# Patient Record
Sex: Female | Born: 1955 | Race: White | Hispanic: No | Marital: Married | Smoking: Former smoker
Health system: Southern US, Community
[De-identification: ages and names within clinical notes are randomized; demographics above are authoritative.]

## PROBLEM LIST (undated history)

## (undated) DIAGNOSIS — C539 Malignant neoplasm of cervix uteri, unspecified: Secondary | ICD-10-CM

## (undated) DIAGNOSIS — D869 Sarcoidosis, unspecified: Secondary | ICD-10-CM

## (undated) HISTORY — DX: Malignant neoplasm of cervix uteri, unspecified: C53.9

---

## 1987-01-31 HISTORY — PX: ABDOMINAL HYSTERECTOMY: SHX81

## 1992-01-31 HISTORY — PX: GALLBLADDER SURGERY: SHX652

## 1997-09-02 ENCOUNTER — Other Ambulatory Visit: Admission: RE | Admit: 1997-09-02 | Discharge: 1997-09-02 | Payer: Self-pay | Admitting: Obstetrics & Gynecology

## 2003-04-06 ENCOUNTER — Other Ambulatory Visit: Admission: RE | Admit: 2003-04-06 | Discharge: 2003-04-06 | Payer: Self-pay | Admitting: Obstetrics and Gynecology

## 2003-10-23 ENCOUNTER — Encounter: Admission: RE | Admit: 2003-10-23 | Discharge: 2003-10-23 | Payer: Self-pay | Admitting: Internal Medicine

## 2003-11-09 ENCOUNTER — Encounter: Payer: Self-pay | Admitting: Internal Medicine

## 2003-11-09 ENCOUNTER — Ambulatory Visit (HOSPITAL_COMMUNITY): Admission: RE | Admit: 2003-11-09 | Discharge: 2003-11-09 | Payer: Self-pay | Admitting: Internal Medicine

## 2004-04-18 ENCOUNTER — Other Ambulatory Visit: Admission: RE | Admit: 2004-04-18 | Discharge: 2004-04-18 | Payer: Self-pay | Admitting: Obstetrics and Gynecology

## 2006-08-15 ENCOUNTER — Ambulatory Visit: Payer: Self-pay | Admitting: Internal Medicine

## 2007-05-17 DIAGNOSIS — F410 Panic disorder [episodic paroxysmal anxiety] without agoraphobia: Secondary | ICD-10-CM | POA: Insufficient documentation

## 2007-05-17 DIAGNOSIS — K219 Gastro-esophageal reflux disease without esophagitis: Secondary | ICD-10-CM

## 2007-05-17 DIAGNOSIS — Z860100 Personal history of colon polyps, unspecified: Secondary | ICD-10-CM | POA: Insufficient documentation

## 2007-05-17 DIAGNOSIS — Z8541 Personal history of malignant neoplasm of cervix uteri: Secondary | ICD-10-CM | POA: Insufficient documentation

## 2007-05-17 DIAGNOSIS — K449 Diaphragmatic hernia without obstruction or gangrene: Secondary | ICD-10-CM | POA: Insufficient documentation

## 2007-05-17 DIAGNOSIS — K589 Irritable bowel syndrome without diarrhea: Secondary | ICD-10-CM | POA: Insufficient documentation

## 2007-11-25 ENCOUNTER — Telehealth: Payer: Self-pay | Admitting: Internal Medicine

## 2007-11-25 ENCOUNTER — Ambulatory Visit: Payer: Self-pay | Admitting: Gastroenterology

## 2007-11-25 DIAGNOSIS — R197 Diarrhea, unspecified: Secondary | ICD-10-CM

## 2007-11-25 DIAGNOSIS — R1013 Epigastric pain: Secondary | ICD-10-CM | POA: Insufficient documentation

## 2007-11-26 LAB — CONVERTED CEMR LAB
ALT: 22 units/L (ref 0–35)
AST: 28 units/L (ref 0–37)
Albumin: 3.6 g/dL (ref 3.5–5.2)
Alkaline Phosphatase: 60 units/L (ref 39–117)
BUN: 11 mg/dL (ref 6–23)
Basophils Absolute: 0.1 10*3/uL (ref 0.0–0.1)
Basophils Relative: 1 % (ref 0.0–3.0)
CO2: 31 meq/L (ref 19–32)
Calcium: 9.1 mg/dL (ref 8.4–10.5)
Chloride: 103 meq/L (ref 96–112)
Creatinine, Ser: 0.9 mg/dL (ref 0.4–1.2)
Eosinophils Absolute: 0.2 10*3/uL (ref 0.0–0.7)
Eosinophils Relative: 3.3 % (ref 0.0–5.0)
GFR calc Af Amer: 84 mL/min
GFR calc non Af Amer: 70 mL/min
Glucose, Bld: 84 mg/dL (ref 70–99)
HCT: 40.7 % (ref 36.0–46.0)
Hemoglobin: 14.2 g/dL (ref 12.0–15.0)
Lymphocytes Relative: 22.5 % (ref 12.0–46.0)
MCHC: 34.9 g/dL (ref 30.0–36.0)
MCV: 89.9 fL (ref 78.0–100.0)
Monocytes Absolute: 0.7 10*3/uL (ref 0.1–1.0)
Monocytes Relative: 9.2 % (ref 3.0–12.0)
Neutro Abs: 4.7 10*3/uL (ref 1.4–7.7)
Neutrophils Relative %: 64 % (ref 43.0–77.0)
Platelets: 242 10*3/uL (ref 150–400)
Potassium: 4.3 meq/L (ref 3.5–5.1)
RBC: 4.53 M/uL (ref 3.87–5.11)
RDW: 13.1 % (ref 11.5–14.6)
Sed Rate: 26 mm/hr — ABNORMAL HIGH (ref 0–22)
Sodium: 140 meq/L (ref 135–145)
Total Bilirubin: 0.7 mg/dL (ref 0.3–1.2)
Total Protein: 6.8 g/dL (ref 6.0–8.3)
WBC: 7.4 10*3/uL (ref 4.5–10.5)

## 2007-11-27 ENCOUNTER — Encounter: Payer: Self-pay | Admitting: Physician Assistant

## 2007-12-18 ENCOUNTER — Ambulatory Visit: Payer: Self-pay | Admitting: Internal Medicine

## 2008-08-06 ENCOUNTER — Encounter: Admission: RE | Admit: 2008-08-06 | Discharge: 2008-08-06 | Payer: Self-pay | Admitting: Obstetrics and Gynecology

## 2009-04-07 ENCOUNTER — Encounter: Admission: RE | Admit: 2009-04-07 | Discharge: 2009-04-07 | Payer: Self-pay | Admitting: Obstetrics and Gynecology

## 2010-02-19 ENCOUNTER — Encounter: Payer: Self-pay | Admitting: Internal Medicine

## 2010-06-14 NOTE — Assessment & Plan Note (Signed)
[facility]                         GASTROENTEROLOGY OFFICE NOTE   Kim Harrison, Kim Harrison                  MRN:          [id number]  DATE:08/15/2006                            DOB:          16-Aug-1955    Kim Harrison is a 55 year old white female with history of irritable  bowel syndrome, status post remote cholecystectomy.  We saw her for ERCP  in 2005 to r/o CBD stricture , exam was unsuccessful.  She had at least  2 episodes of severe mid-umbilical abdominal pain, in the last several  months.  The last one was evaluated with a CT scan of the abdomen.  This  was on April 30, 2006, showing right ovarian cyst, no other abnormality  other than status post the hysterectomy and cholecystectomy state.  She  has a history of adenomatous polyps of the colon.  She had multiple  colonoscopies in the past.  None of them showed presence of diverticula.  The last colonoscopy  in October 2005 did not show any polyps.  She is  due for her next colonoscopy in 2012.   MEDICATIONS:  1. Nexium 40 mg p.o. q.d.  2. Synthroid 1 p.o. q.d.   PHYSICAL EXAMINATION:  VITAL SIGNS:  Blood pressure 108/60, pulse 64 and  weighed 161 pounds.  She was alert, in no distress.  LUNGS:  Clear to auscultation.  COR:  Normal S1, normal S2.  ABDOMEN:  Soft, minimally tender around the peri-umbilical area.  No  distention, normoactive bowel sounds.  The left lower quadrant was  unremarkable.  Epigastrium was normal.  RECTAL:  Exam not done.   IMPRESSION:  A 55 year old white female with episodes of peri-umbilical  abdominal pain.  She was recently treated for diverticulitis by 2  courses of antibiotics with relief of the pain.  It is not really clear  whether she has irritable bowel syndrome with spasm, or whether she  actually had a non-specific colitis or diverticulitis, but judging from  the absence of diverticulitis on the colonoscopy, it would be unlikely  for her to have  actual diverticulitis.  The last episode occurred within  the same week of her mother passing away.   PLAN:  1. Bentyl 10 mg, to take on p.r.n. basis for abdominal  pain.  2. Call our office.  We will to work her in, if she has abdominal      pain, so we can see her while the pain is going on.  3. Next colonoscopy October 2012.    Hedwig Morton. Juanda Chance, MD  Electronically Signed   DMB/MedQ  DD: 08/15/2006  DT: 08/16/2006  Job #: [id number]   cc:   Fredia Beets, M.D.

## 2010-06-17 NOTE — Op Note (Signed)
NAME:  Kim Harrison, Kim Harrison         ACCOUNT NO.:  [id number]   MEDICAL RECORD NO.:  [id number]          PATIENT TYPE:  AMB   LOCATION:  ENDO                         FACILITY:  [facility]   PHYSICIAN:  Lina Sar, M.D. [facility]  DATE OF BIRTH:  Dec 04, 1955   DATE OF PROCEDURE:  11/09/2003  DATE OF DISCHARGE:                                 OPERATIVE REPORT   NAME OF PROCEDURE:  ERCP/upper endoscopy.   INDICATIONS:  This 55 year old white female has history of epigastric pain  with a history of irritable bowel syndrome and intensive pain.  Last attack  occurred about 2 months ago.  She had laparoscopic cholecystectomy in 1994.  Her CT scan of the abdomen was normal with normal anatomy in the right upper  quadrant and normal common bile duct.  Her liver function tests showed mild  elevation of ALT.  Her amylase and lipase were normal.  She has responded  symptomatically to antispasmodics, NuLev 0.125 mg.  She has a history of  adenomatous polyp of the colon and just underwent colonoscopy with findings  of normal colon.  She is also undergoing upper endoscopy/ERCP in order to  assess her for possibility of retained common bile duct stone or gastric  abnormality.   ENDOSCOPE:  Olympus single-channel video endoscope.   SEDATION:  Versed 15 mg IV, fentanyl 125 mcg IV.   FINDINGS:  Olympus single-channel side-viewing duodenoscope passed blindly  through the posterior pharynx and into the esophagus.  The gastric mucosa  was examined thoroughly and showed normal rugae pattern, normal gastric  antrum and lesser curvature of the stomach.  The pyloric outlet was normal.  Retroflexion of the endoscope revealed normal fundus and cardia.  Duodenal  bulb and descending duodenum was unremarkable.  The papilla was located  __________ to the antrum of normal size and configuration.  Bile was exuding  from the papilla.  Unfortunately, we were unable to cannulate the papilla  easily and were afraid that too much  air interpolation would prevent  subsequent colonoscopy.  No abnormal was seen other than surgical clips in  right upper quadrant from previous cholecystectomy.   IMPRESSION:  Normal upper endoscopy of stomach and duodenum with attempted  ERCP but no visualization of the duct.   PLAN:  We will follow up the patient's liver function tests and also  continue her on antispasmodics.  I would like to see her if she has another  attack of abdominal pain, and at that time we would proceed with a HIDA scan  to rule out a common bile duct obstruction.  She so far as responded to use  of antispasmodics and these will be used for any attack of abdominal pain.     Dora   DB/MEDQ  D:  11/09/2003  T:  11/09/2003  Job:  [id number]   cc:   [place] Dr. Fredia Beets

## 2011-01-03 ENCOUNTER — Encounter: Payer: Self-pay | Admitting: Internal Medicine

## 2011-10-20 ENCOUNTER — Encounter: Payer: Self-pay | Admitting: Internal Medicine

## 2013-05-15 ENCOUNTER — Encounter: Payer: Self-pay | Admitting: Pulmonary Disease

## 2013-05-15 ENCOUNTER — Encounter (INDEPENDENT_AMBULATORY_CARE_PROVIDER_SITE_OTHER): Payer: Self-pay

## 2013-05-15 ENCOUNTER — Ambulatory Visit (INDEPENDENT_AMBULATORY_CARE_PROVIDER_SITE_OTHER): Payer: BC Managed Care – PPO | Admitting: Pulmonary Disease

## 2013-05-15 VITALS — BP 104/72 | HR 78 | Temp 97.0°F | Ht 65.5 in | Wt 166.4 lb

## 2013-05-15 DIAGNOSIS — R0609 Other forms of dyspnea: Secondary | ICD-10-CM

## 2013-05-15 DIAGNOSIS — R599 Enlarged lymph nodes, unspecified: Secondary | ICD-10-CM

## 2013-05-15 DIAGNOSIS — R0989 Other specified symptoms and signs involving the circulatory and respiratory systems: Secondary | ICD-10-CM

## 2013-05-15 DIAGNOSIS — R590 Localized enlarged lymph nodes: Secondary | ICD-10-CM

## 2013-05-15 DIAGNOSIS — R0600 Dyspnea, unspecified: Secondary | ICD-10-CM

## 2013-05-15 MED ORDER — PREDNISONE 10 MG PO TABS: ORAL_TABLET | ORAL | Status: DC

## 2013-05-15 NOTE — Assessment & Plan Note (Signed)
The patient has minimal lymphadenopathy in her mediastinum on her recent chest CT, and more than likely this is reactive in nature. However, given her extensive smoking history, and this should be repeated in approximately 6 months for completeness.

## 2013-05-15 NOTE — Assessment & Plan Note (Signed)
I suspect the patient's dyspnea on exertion after her recent pulmonary infection is probably related to ongoing airway inflammation. Currently, she does not have airflow obstruction, and therefore does not have COPD. However, she may have some underlying airways disease from her prior smoking which puts her at risk for her airway inflammation with infection. I would like to treat her with a course of prednisone over the next 8 days and see if her breathing normalizes. If this does not help at all, then I would consider whether her thyroid disease or occult cardiac disease could be contributing to her symptoms.

## 2013-05-15 NOTE — Addendum Note (Signed)
Addended by: Mathis Bud on: 05/15/2013 12:15 PM   Modules accepted: Orders

## 2013-05-15 NOTE — Progress Notes (Signed)
   Subjective:    Patient ID: Kim Harrison, female    DOB: 19-Nov-1955, 58 y.o.   MRN: [id number]  HPI The patient is a 58 year old female who I've been asked to see for persistent dyspnea on exertion. She has a long history of tobacco use, but has not smoked since 1994. She was diagnosed with some type of pulmonary infection in December that may have been pneumonia, and was treated with Levaquin for about 10 days. Her cough and congestion greatly improved, but her dyspnea on exertion continued. Although it has very slowly improved, she has never gotten back to baseline. She is able to walk on flat ground fairly well, but will get winded walking up stairs and mildly bringing groceries in from the car. She was tried on dulera, and thinks this may have helped in total it ran out about 2 weeks ago. She does not see as much improvement with albuterol. She has no history of asthma as a child. She has had a CT of her chest last month that shows mild emphysematous changes, very mild dependent atelectasis in the basis, and minimal lymphadenopathy of unknown origin.   Review of Systems  Constitutional: Negative for fever and unexpected weight change.  HENT: Negative for congestion, dental problem, ear pain, nosebleeds, postnasal drip, rhinorrhea, sinus pressure, sneezing, sore throat and trouble swallowing.   Eyes: Negative for redness and itching.  Respiratory: Positive for cough and shortness of breath. Negative for chest tightness and wheezing.   Cardiovascular: Negative for palpitations and leg swelling.  Gastrointestinal: Negative for nausea and vomiting.  Genitourinary: Negative for dysuria.  Musculoskeletal: Negative for joint swelling.  Skin: Negative for rash.  Neurological: Negative for headaches.  Hematological: Does not bruise/bleed easily.  Psychiatric/Behavioral: Negative for dysphoric mood. The patient is not nervous/anxious.        Objective:   Physical Exam Constitutional:  Well  developed, no acute distress  HENT:  Nares patent without discharge  Oropharynx without exudate, palate and uvula are normal  Eyes:  Perrla, eomi, no scleral icterus  Neck:  No JVD, no TMG  Cardiovascular:  Normal rate, regular rhythm, no rubs or gallops.  No murmurs        Intact distal pulses  Pulmonary :  Normal breath sounds, no stridor or respiratory distress   No rales, rhonchi, or wheezing  Abdominal:  Soft, nondistended, bowel sounds present.  No tenderness noted.   Musculoskeletal:  No lower extremity edema noted.  Lymph Nodes:  No cervical lymphadenopathy noted  Skin:  No cyanosis noted  Neurologic:  Alert, appropriate, moves all 4 extremities without obvious deficit.         Assessment & Plan:

## 2013-05-15 NOTE — Patient Instructions (Signed)
Your breathing studies do not show COPD.  Will treat with a course of prednisone for 8 days for airway inflammation to see if this will help your breathing.  Please call me in 2 weeks to give feedback with how things are going You should have a followup chest ct in 62mos to recheck your lymph nodes.  Will order this at [facility] ([facility]), and call you with results once available.

## 2013-10-23 ENCOUNTER — Telehealth: Payer: Self-pay | Admitting: Pulmonary Disease

## 2013-10-23 NOTE — Telephone Encounter (Signed)
Pt had CT done at [place]. Reports is in [facility] look at. Called pt and line busy x3 wcb

## 2013-10-24 NOTE — Telephone Encounter (Signed)
Spoke with pt - she is aware of below.   [place], please advise.   Thank you.

## 2013-10-28 NOTE — Telephone Encounter (Signed)
Let pt know that her ct is stable from the one earlier in the year.  I would really like her to come in so we can discuss further, and talk about whether we need to do anything about this.

## 2013-10-28 NOTE — Telephone Encounter (Signed)
[phone] wants her results

## 2013-10-29 NOTE — Telephone Encounter (Signed)
Called pt. appt scheduled to see Rarden.

## 2013-10-30 ENCOUNTER — Encounter (INDEPENDENT_AMBULATORY_CARE_PROVIDER_SITE_OTHER): Payer: Self-pay

## 2013-10-30 ENCOUNTER — Encounter: Payer: Self-pay | Admitting: Pulmonary Disease

## 2013-10-30 ENCOUNTER — Ambulatory Visit (INDEPENDENT_AMBULATORY_CARE_PROVIDER_SITE_OTHER): Payer: BC Managed Care – PPO | Admitting: Pulmonary Disease

## 2013-10-30 VITALS — BP 110/70 | HR 88 | Temp 97.0°F | Ht 65.0 in | Wt 165.5 lb

## 2013-10-30 DIAGNOSIS — R599 Enlarged lymph nodes, unspecified: Secondary | ICD-10-CM

## 2013-10-30 DIAGNOSIS — R590 Localized enlarged lymph nodes: Secondary | ICD-10-CM

## 2013-10-30 NOTE — Progress Notes (Signed)
   Subjective:    Patient ID: Kim Harrison, female    DOB: [birth year], 58 y.o.   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  This is felt to be reactive or possibly inflammatory in nature. She's had a recent CT chest that shows no change from 6 months ago, making cancer much less likely. I have reviewed the CT results in detail with her and a family member, and answered all of their questions.   Review of Systems  Constitutional: Negative for fever and unexpected weight change.  HENT: Negative for congestion, dental problem, ear pain, nosebleeds, postnasal drip, rhinorrhea, sinus pressure, sneezing, sore throat and trouble swallowing.   Eyes: Negative for redness and itching.  Respiratory: Negative for cough, chest tightness, shortness of breath and wheezing.   Cardiovascular: Negative for palpitations and leg swelling.  Gastrointestinal: Negative for nausea and vomiting.  Genitourinary: Negative for dysuria.  Musculoskeletal: Negative for joint swelling.  Skin: Negative for rash.  Neurological: Negative for headaches.  Hematological: Does not bruise/bleed easily.  Psychiatric/Behavioral: Negative for dysphoric mood. The patient is not nervous/anxious.        Objective:   Physical Exam Well-developed female in acute distress Lower extremities without edema, no cyanosis Alert and oriented, moves all 4 extremities.       Assessment & Plan:

## 2013-10-30 NOTE — Patient Instructions (Signed)
Will schedule for a followup scan in 40mos, and will call you with results.

## 2013-10-30 NOTE — Assessment & Plan Note (Addendum)
The patient has had a followup CT chest that shows persistent moderate mediastinal lymphadenopathy. This is very unlikely to be small cell carcinoma with no change over 6 months, and is also unlikely to be lymphoma. I suspect this is either an inflammatory process, or possibly reactive in nature. I have had a long discussion with the patient and her family member about continued observation versus biopsy with EBU S. or mediastinoscopy.  I have also discussed with them that she may not even require treatment if this is some kind of inflammatory process. At this point, the patient would like to followup with continued observation, and will consider whether to do a biopsy or not.  Time spent discussing the above was 49min.

## 2013-11-19 ENCOUNTER — Encounter: Payer: Self-pay | Admitting: Pulmonary Disease

## 2014-01-07 ENCOUNTER — Ambulatory Visit (INDEPENDENT_AMBULATORY_CARE_PROVIDER_SITE_OTHER): Payer: BC Managed Care – PPO | Admitting: Pulmonary Disease

## 2014-01-07 ENCOUNTER — Ambulatory Visit (INDEPENDENT_AMBULATORY_CARE_PROVIDER_SITE_OTHER)
Admission: RE | Admit: 2014-01-07 | Discharge: 2014-01-07 | Disposition: A | Discharge status: Home / Self Care | Payer: BC Managed Care – PPO | Source: Ambulatory Visit | Attending: Pulmonary Disease | Admitting: Pulmonary Disease

## 2014-01-07 ENCOUNTER — Encounter: Payer: Self-pay | Admitting: Pulmonary Disease

## 2014-01-07 VITALS — BP 92/68 | HR 86 | Temp 97.1°F | Ht 65.0 in | Wt 169.5 lb

## 2014-01-07 DIAGNOSIS — R05 Cough: Secondary | ICD-10-CM | POA: Insufficient documentation

## 2014-01-07 DIAGNOSIS — R599 Enlarged lymph nodes, unspecified: Secondary | ICD-10-CM

## 2014-01-07 DIAGNOSIS — R590 Localized enlarged lymph nodes: Secondary | ICD-10-CM

## 2014-01-07 DIAGNOSIS — R053 Chronic cough: Secondary | ICD-10-CM | POA: Insufficient documentation

## 2014-01-07 MED ORDER — HYDROCODONE-HOMATROPINE 5-1.5 MG/5ML PO SYRP: 5.0000 mL | ORAL_SOLUTION | ORAL | Status: DC | PRN

## 2014-01-07 NOTE — Progress Notes (Signed)
   Subjective:    Patient ID: Kim Harrison, female    DOB: [birth year], 58 y.o.   MRN: [id number]  HPI The patient comes in today for an acute sick visit. 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 Most recently she has had cold-like symptoms, which has resulted in an escalation of her cough. She develops cough paroxysms to the point of near vomiting. The cough is completely dry, and she does not have chest congestion. She is worried because the last time she had the symptoms she developed pneumonia. She has had no fevers or chills, and no increased shortness of breath. She had discontinued her acid reflux medication, and thought the 2 were related, but the cough has continued despite being back on her proton pump inhibitor. She denies any significant postnasal drip at this time.   Review of Systems  Constitutional: Negative for fever and unexpected weight change.  HENT: Positive for congestion. Negative for dental problem, ear pain, nosebleeds, postnasal drip, rhinorrhea, sinus pressure, sneezing, sore throat and trouble swallowing.   Eyes: Negative for redness and itching.  Respiratory: Positive for cough, choking and chest tightness. Negative for shortness of breath and wheezing.   Cardiovascular: Negative for palpitations and leg swelling.  Gastrointestinal: Negative for nausea and vomiting.  Genitourinary: Negative for dysuria.  Musculoskeletal: Negative for joint swelling.  Skin: Negative for rash.  Neurological: Negative for headaches.  Hematological: Does not bruise/bleed easily.  Psychiatric/Behavioral: Negative for dysphoric mood. The patient is not nervous/anxious.        Objective:   Physical Exam Well-developed female in no acute distress Nose without purulence or discharge noted Lymphadenopathy or thyromegaly Chest is totally clear to auscultation, no wheezing Cardiac exam  with regular rate and rhythm Lower extremities without edema, no cyanosis Alert and oriented, moves all 4 extremities.       Assessment & Plan:

## 2014-01-07 NOTE — Assessment & Plan Note (Signed)
The patient has a history of a chronic cough that sounds more upper airway in origin than lower. She most recently has developed a typical head cold that sounds viral in nature, and is worried about developing pneumonia. We can certainly check a chest x-ray to evaluate for pneumonia, but her cough is totally dry and sounds more upper airway. She has been taking reflux medication without improvement. At this point, I would like to suppress her cough, and have also reviewed behavioral therapies that will help with cough propagation.  Because this has been an ongoing process, and she gets frequent "colds", will schedule for a methacholine challenge test to rule out the possibility of cough variant asthma. If this is unremarkable, we will have to have a discussion whether to proceed with biopsy of her mild to moderate mediastinal lymphadenopathy to evaluate for sarcoidosis, which can also cause a chronic cough.

## 2014-01-07 NOTE — Patient Instructions (Signed)
Will suppress cough with hydromet one teaspoon every 4 hrs if needed for cough Limit voice use as much as possible, NO throat clearing Use hard candy (no menthol or mint) to bathe the back of your throat during the day. Will check chest xray today to make sure no pneumonia Will schedule for methacholine challenge test to evaluate for cough variant asthma.  Will call you once the results are available.

## 2014-01-21 ENCOUNTER — Ambulatory Visit (HOSPITAL_COMMUNITY)
Admission: RE | Admit: 2014-01-21 | Discharge: 2014-01-21 | Disposition: A | Discharge status: Home / Self Care | Payer: BC Managed Care – PPO | Source: Ambulatory Visit | Attending: Pulmonary Disease | Admitting: Pulmonary Disease

## 2014-01-21 DIAGNOSIS — F17210 Nicotine dependence, cigarettes, uncomplicated: Secondary | ICD-10-CM | POA: Diagnosis not present

## 2014-01-21 DIAGNOSIS — R05 Cough: Secondary | ICD-10-CM | POA: Diagnosis present

## 2014-01-21 DIAGNOSIS — R053 Chronic cough: Secondary | ICD-10-CM

## 2014-01-21 LAB — PULMONARY FUNCTION TEST
FEF 25-75 Post: 2.17 L/sec
FEF 25-75 Pre: 1.49 L/sec
FEF2575-%Change-Post: 45 %
FEF2575-%Pred-Post: 89 %
FEF2575-%Pred-Pre: 61 %
FEV1-%Change-Post: 7 %
FEV1-%Pred-Post: 71 %
FEV1-%Pred-Pre: 66 %
FEV1-Post: 1.9 L
FEV1-Pre: 1.77 L
FEV1FVC-%Change-Post: 13 %
FEV1FVC-%Pred-Pre: 97 %
FEV6-%Change-Post: -5 %
FEV6-%Pred-Post: 66 %
FEV6-%Pred-Pre: 70 %
FEV6-Post: 2.2 L
FEV6-Pre: 2.34 L
FEV6FVC-%Pred-Post: 103 %
FEV6FVC-%Pred-Pre: 103 %
FVC-%Change-Post: -5 %
FVC-%Pred-Post: 64 %
FVC-%Pred-Pre: 67 %
FVC-Post: 2.2 L
FVC-Pre: 2.34 L
Post FEV1/FVC ratio: 86 %
Post FEV6/FVC ratio: 100 %
Pre FEV1/FVC ratio: 76 %
Pre FEV6/FVC Ratio: 100 %

## 2014-01-21 MED ORDER — ALBUTEROL SULFATE (2.5 MG/3ML) 0.083% IN NEBU
2.5000 mg | INHALATION_SOLUTION | Freq: Once | RESPIRATORY_TRACT | Status: AC
  Administered 2014-01-21: 2.5 mg via RESPIRATORY_TRACT

## 2014-01-21 MED ORDER — METHACHOLINE 1 MG/ML NEB SOLN
2.0000 mL | Freq: Once | RESPIRATORY_TRACT | Status: AC
  Administered 2014-01-21: 2 mg via RESPIRATORY_TRACT

## 2014-01-21 MED ORDER — METHACHOLINE 0.0625 MG/ML NEB SOLN
2.0000 mL | Freq: Once | RESPIRATORY_TRACT | Status: AC
  Administered 2014-01-21: 0.125 mg via RESPIRATORY_TRACT

## 2014-01-21 MED ORDER — METHACHOLINE 0.25 MG/ML NEB SOLN
2.0000 mL | Freq: Once | RESPIRATORY_TRACT | Status: AC
  Administered 2014-01-21: 0.5 mg via RESPIRATORY_TRACT

## 2014-01-21 MED ORDER — METHACHOLINE 4 MG/ML NEB SOLN
2.0000 mL | Freq: Once | RESPIRATORY_TRACT | Status: AC
  Administered 2014-01-21: 8 mg via RESPIRATORY_TRACT

## 2014-01-21 MED ORDER — ALBUTEROL SULFATE (2.5 MG/3ML) 0.083% IN NEBU: 2.5000 mg | INHALATION_SOLUTION | Freq: Once | RESPIRATORY_TRACT | Status: DC

## 2014-01-21 MED ORDER — SODIUM CHLORIDE 0.9 % IN NEBU
3.0000 mL | INHALATION_SOLUTION | Freq: Once | RESPIRATORY_TRACT | Status: AC
  Administered 2014-01-21: 3 mL via RESPIRATORY_TRACT

## 2014-01-21 MED ORDER — METHACHOLINE 16 MG/ML NEB SOLN
2.0000 mL | Freq: Once | RESPIRATORY_TRACT | Status: AC
  Administered 2014-01-21: 32 mg via RESPIRATORY_TRACT

## 2014-01-22 ENCOUNTER — Telehealth: Payer: Self-pay | Admitting: *Deleted

## 2014-01-22 NOTE — Telephone Encounter (Signed)
Called pt to schedule OV to discuss meth challenge test results.  She was upset and stated she wished I did not call her today on christmas eve and now she will worry all through [place]. She wants to know what the results are instead of having to wait until 02/04/13 to be seen. She did not schedule appt yet. Pt was upset bc I did not give her any results on the phone. Please advise [place] thanks

## 2014-01-22 NOTE — Telephone Encounter (Signed)
Pt is aware and appt scheduled for 02/04/14. Nothing further needed

## 2014-01-22 NOTE — Telephone Encounter (Signed)
There is nothing bad on her meth challenge, and certainly nothing to ruin her christmas.  She really needs to have an appt to discuss her test but also her cough and xrays further.   Please make an ov apptm.

## 2014-02-04 ENCOUNTER — Encounter: Payer: Self-pay | Admitting: Pulmonary Disease

## 2014-02-04 ENCOUNTER — Ambulatory Visit (INDEPENDENT_AMBULATORY_CARE_PROVIDER_SITE_OTHER): Payer: BLUE CROSS/BLUE SHIELD | Admitting: Pulmonary Disease

## 2014-02-04 VITALS — BP 118/70 | HR 80 | Temp 97.4°F | Ht 65.0 in | Wt 167.8 lb

## 2014-02-04 DIAGNOSIS — R599 Enlarged lymph nodes, unspecified: Secondary | ICD-10-CM

## 2014-02-04 DIAGNOSIS — R590 Localized enlarged lymph nodes: Secondary | ICD-10-CM

## 2014-02-04 DIAGNOSIS — R05 Cough: Secondary | ICD-10-CM

## 2014-02-04 DIAGNOSIS — R053 Chronic cough: Secondary | ICD-10-CM

## 2014-02-04 NOTE — Progress Notes (Signed)
   Subjective:    Patient ID: Kim Harrison, female    DOB: Dec 08, 1955, 59 y.o.   MRN: [id number]  HPI  patient comes in today for follow-up of her chronic cough, mediastinal lymphadenopathy, and also a recent methacholine challenge. Her methacholine challenge was negative for hyperreactive airways disease. The patient's cough was treated aggressively with suppression and behavioral therapies, and unfortunately has totally resolved. The patient feels that she is back to her usual baseline.   Review of Systems  Constitutional: Negative for fever and unexpected weight change.  HENT: Negative for congestion, dental problem, ear pain, nosebleeds, postnasal drip, rhinorrhea, sinus pressure, sneezing, sore throat and trouble swallowing.   Eyes: Negative for redness and itching.  Respiratory: Negative for cough, chest tightness, shortness of breath and wheezing.   Cardiovascular: Negative for palpitations and leg swelling.  Gastrointestinal: Negative for nausea and vomiting.  Genitourinary: Negative for dysuria.  Musculoskeletal: Negative for joint swelling.  Skin: Negative for rash.  Neurological: Negative for headaches.  Hematological: Does not bruise/bleed easily.  Psychiatric/Behavioral: Negative for dysphoric mood. The patient is not nervous/anxious.        Objective:   Physical Exam  well-developed female in no acute distress  Nose without purulence or discharge noted  Neck without lymphadenopathy or thyromegaly  Lower extremities without edema, no cyanosis  Alert and oriented, moves all 4 extremities.       Assessment & Plan:

## 2014-02-04 NOTE — Assessment & Plan Note (Signed)
The patient's cough has now result with cough suppression and behavioral therapies for upper airway irritation. Her methacholine challenge test was negative for hyperreactive airways. I really believe this was cyclical coughing after her episode of pneumonia.

## 2014-02-04 NOTE — Assessment & Plan Note (Signed)
The patient is scheduled for a follow-up scan in April of this year.

## 2014-02-04 NOTE — Patient Instructions (Signed)
Keep your followup for a repeat ct chest in April If you start feeling your cough is coming back, work on limiting your voice/hard candy during day to limit tickle/no throat clearing. Will call after your scan in April

## 2014-05-11 ENCOUNTER — Other Ambulatory Visit: Payer: BC Managed Care – PPO

## 2015-07-22 DIAGNOSIS — Y999 Unspecified external cause status: Secondary | ICD-10-CM | POA: Diagnosis not present

## 2015-07-22 DIAGNOSIS — Z87891 Personal history of nicotine dependence: Secondary | ICD-10-CM | POA: Diagnosis not present

## 2015-07-22 DIAGNOSIS — Y9389 Activity, other specified: Secondary | ICD-10-CM | POA: Insufficient documentation

## 2015-07-22 DIAGNOSIS — T189XXA Foreign body of alimentary tract, part unspecified, initial encounter: Secondary | ICD-10-CM | POA: Diagnosis present

## 2015-07-22 DIAGNOSIS — Z8541 Personal history of malignant neoplasm of cervix uteri: Secondary | ICD-10-CM | POA: Insufficient documentation

## 2015-07-22 DIAGNOSIS — S27818A Other injury of esophagus (thoracic part), initial encounter: Secondary | ICD-10-CM | POA: Diagnosis not present

## 2015-07-22 DIAGNOSIS — X58XXXA Exposure to other specified factors, initial encounter: Secondary | ICD-10-CM | POA: Insufficient documentation

## 2015-07-22 DIAGNOSIS — Z79899 Other long term (current) drug therapy: Secondary | ICD-10-CM | POA: Diagnosis not present

## 2015-07-22 DIAGNOSIS — Y929 Unspecified place or not applicable: Secondary | ICD-10-CM | POA: Insufficient documentation

## 2015-07-23 ENCOUNTER — Encounter (HOSPITAL_COMMUNITY): Payer: Self-pay | Admitting: Emergency Medicine

## 2015-07-23 ENCOUNTER — Emergency Department (HOSPITAL_COMMUNITY): Payer: BLUE CROSS/BLUE SHIELD

## 2015-07-23 ENCOUNTER — Emergency Department (HOSPITAL_COMMUNITY)
Admission: EM | Admit: 2015-07-23 | Discharge: 2015-07-23 | Disposition: A | Discharge status: Home / Self Care | Payer: BLUE CROSS/BLUE SHIELD | Attending: Emergency Medicine | Admitting: Emergency Medicine

## 2015-07-23 DIAGNOSIS — S27818A Other injury of esophagus (thoracic part), initial encounter: Secondary | ICD-10-CM

## 2015-07-23 HISTORY — DX: Sarcoidosis, unspecified: D86.9

## 2015-07-23 MED ORDER — GI COCKTAIL ~~LOC~~
30.0000 mL | Freq: Once | ORAL | Status: AC
  Administered 2015-07-23: 30 mL via ORAL
  Filled 2015-07-23: qty 30

## 2015-07-23 MED ORDER — MAGIC MOUTHWASH: 5.0000 mL | Freq: Three times a day (TID) | ORAL | Status: DC | PRN

## 2015-07-23 NOTE — ED Notes (Signed)
Pt. reports foreign object stuck in her throat while eating dinner this evening , airway intact / respirations unlabored , no obstruction during visual inspection .

## 2015-07-23 NOTE — ED Provider Notes (Signed)
CSN: [id number]     Arrival date & time 07/22/15  2359 History  By signing my name below, I, Kim Harrison, attest that this documentation has been prepared under the direction and in the presence of physician practitioner, Merryl Hacker, MD. Electronically Signed: Dora Harrison, Scribe. 07/23/2015. 3:34 AM.   Chief Complaint  Patient presents with  . Swallowed Foreign Body    The history is provided by the patient. No language interpreter was used.     HPI Comments: Kim Harrison is a 60 y.o. female who presents to the Emergency Department complaining of swallowing a foreign body while eating boneless flounder for dinner yesterday evening. She also notes that she ate a salad which contained peas. Pt reports that it feels like there is something stuck in her throat and endorses moderate, stabbing pain in her throat with swallowing since onset. She has not experienced similar episodes in the past. She denies difficulty swallowing, vomiting, SOB, or any other associated symptoms.  Past Medical History  Diagnosis Date  . Cervical cancer ([place])   . Sarcoidosis [facility])    Past Surgical History  Procedure Laterality Date  . Gallbladder surgery  1994  . Abdominal hysterectomy  1989   Family History  Problem Relation Age of Onset  . Rheum arthritis Mother   . Allergies Mother   . Multiple sclerosis Sister   . Breast cancer Mother   . Lung cancer Maternal Grandfather   . Lung cancer Cousin   . Lung cancer Maternal Uncle   . Lung cancer Maternal Uncle   . Lung cancer Maternal Uncle   . Pulmonary fibrosis Mother     deceased  . Autoimmune disease Cousin    Social History  Substance Use Topics  . Smoking status: Former Smoker -- 3.00 packs/day for 0 years    Types: Cigarettes    Quit date: 01/31/1992  . Smokeless tobacco: None     Comment: pt reports smoking 2-3 PPD-- 3 PPD at heaviest  . Alcohol Use: Yes   OB History    No data available     Review of Systems  HENT:  Positive for sore throat. Negative for trouble swallowing.        Positive for throat pain (with swallowing). Positive for foreign body in throat.  Respiratory: Negative for shortness of breath.   Gastrointestinal: Negative for vomiting.  All other systems reviewed and are negative.  Allergies  Cephalexin; Penicillins; and Sulfonamide derivatives  Home Medications   Prior to Admission medications   Medication Sig Start Date End Date Taking? Authorizing Provider  dexlansoprazole (DEXILANT) 60 MG capsule Take 60 mg by mouth daily.    Historical Provider, MD  EPINEPHrine (EPIPEN 2-PAK) 0.3 mg/0.3 mL IJ SOAJ injection Inject 0.3 mg into the muscle once.    Historical Provider, MD  magic mouthwash SOLN Take 5 mLs by mouth 3 (three) times daily as needed (throat pain). 07/23/15   Merryl Hacker, MD  mometasone (ASMANEX 120 METERED DOSES) 220 MCG/INH inhaler Inhale 1 puff into the lungs daily.    Historical Provider, MD  NEXIUM 40 MG capsule Take 1 capsule by mouth daily. 05/13/13   Historical Provider, MD  pravastatin (PRAVACHOL) 40 MG tablet Take 1 tablet by mouth daily. 05/13/13   Historical Provider, MD  SYNTHROID 75 MCG tablet Take 1 tablet by mouth daily. 05/13/13   Historical Provider, MD   BP 113/88 mmHg  Pulse 93  Temp(Src) 98.1 F (36.7 C) (Oral)  Resp 20  Ht 5\' 5"  (1.651 m)  Wt 155 lb (70.308 kg)  BMI 25.79 kg/m2  SpO2 99% Physical Exam  Constitutional: She is oriented to person, place, and time. She appears well-developed and well-nourished. No distress.  HENT:  Head: Normocephalic and atraumatic.  Mouth/Throat: Oropharynx is clear and moist.  Eyes: Pupils are equal, round, and reactive to light.  Neck: Normal range of motion. Neck supple.  Cardiovascular: Normal rate, regular rhythm and normal heart sounds.   No murmur heard. Pulmonary/Chest: Effort normal and breath sounds normal. No stridor. No respiratory distress. She has no wheezes.  Abdominal: Soft. There is no  tenderness.  Neurological: She is alert and oriented to person, place, and time.  Skin: Skin is warm and dry.  Psychiatric: She has a normal mood and affect.  Nursing note and vitals reviewed.   ED Course  Procedures (including critical care time)  DIAGNOSTIC STUDIES: Oxygen Saturation is 99% on RA, normal by my interpretation.    COORDINATION OF CARE: 3:34 AM Discussed treatment plan with pt at bedside and pt agreed to plan.  Labs Review Labs Reviewed - No data to display  Imaging Review Dg Neck Soft Tissue  07/23/2015  CLINICAL DATA:  60 year old female with foreign body sensation in the throat EXAM: NECK SOFT TISSUES - 1+ VIEW COMPARISON:  None. FINDINGS: There is no evidence of retropharyngeal soft tissue swelling or epiglottic enlargement. The cervical airway is unremarkable and no radio-opaque foreign body identified. IMPRESSION: Negative. Electronically Signed   By: Anner Crete M.D.   On: 07/23/2015 00:58   I have personally reviewed and evaluated these images and lab results as part of my medical decision-making.   EKG Interpretation None      MDM   Final diagnoses:  Esophageal abrasion, initial encounter    Patient presents with foreign body sensation in her throat. She is nontoxic on exam. She is tolerating her secretions. She is not vomiting and has been able to swallow and eat. She reports worsening of pain with eating. Soft tissue neck negative for foreign body. Suspect abrasion of the esophagus. She was given a GI cocktail and did have improvement of the sensation. She is also able to tolerate fluids. Will discharge home with Magic mouthwash as needed. She was given gastroenterology follow-up if she has any worsening of her symptoms.  After history, exam, and medical workup I feel the patient has been appropriately medically screened and is safe for discharge home. Pertinent diagnoses were discussed with the patient. Patient was given return precautions.  I  personally performed the services described in this documentation, which was scribed in my presence. The recorded information has been reviewed and is accurate.   Merryl Hacker, MD 07/23/15 [phone]

## 2015-07-23 NOTE — Discharge Instructions (Signed)
Esophageal abrasion  You were seen today and likely have a minor abrasion of your esophagus secondary to food. There is no evidence of esophageal obstruction. You may eat as tolerated. Magic mouth wash 3 times a day as needed for pain.  SEEK MEDICAL CARE IF:  You continue to have symptoms of a swallowed foreign body.  The object has not passed out of your body after 3 days. SEEK IMMEDIATE MEDICAL CARE IF:  You have a fever.  You have pain in your chest or your abdomen.  You cough up blood.  You have blood in your stool (feces) or your vomit.   This information is not intended to replace advice given to you by your health care provider. Make sure you discuss any questions you have with your health care provider.   Document Released: 07/06/2009 Document Revised: 10/07/2014 Document Reviewed: 04/15/2014 Elsevier Interactive Patient Education [employer].

## 2017-04-30 ENCOUNTER — Encounter: Payer: Self-pay | Admitting: Gastroenterology

## 2017-09-17 ENCOUNTER — Telehealth: Payer: Self-pay

## 2017-09-17 NOTE — Telephone Encounter (Signed)
Error

## 2018-12-09 ENCOUNTER — Emergency Department (HOSPITAL_COMMUNITY): Payer: BC Managed Care – PPO

## 2018-12-09 ENCOUNTER — Encounter (HOSPITAL_COMMUNITY): Payer: Self-pay

## 2018-12-09 ENCOUNTER — Inpatient Hospital Stay (HOSPITAL_COMMUNITY)
Admission: EM | Admit: 2018-12-09 | Discharge: 2019-01-31 | DRG: 207 | Disposition: E | Payer: BC Managed Care – PPO | Attending: Pulmonary Disease | Admitting: Pulmonary Disease

## 2018-12-09 ENCOUNTER — Other Ambulatory Visit: Payer: Self-pay

## 2018-12-09 DIAGNOSIS — E870 Hyperosmolality and hypernatremia: Secondary | ICD-10-CM | POA: Diagnosis not present

## 2018-12-09 DIAGNOSIS — R6521 Severe sepsis with septic shock: Secondary | ICD-10-CM | POA: Diagnosis not present

## 2018-12-09 DIAGNOSIS — R1110 Vomiting, unspecified: Secondary | ICD-10-CM

## 2018-12-09 DIAGNOSIS — E873 Alkalosis: Secondary | ICD-10-CM | POA: Diagnosis present

## 2018-12-09 DIAGNOSIS — Z801 Family history of malignant neoplasm of trachea, bronchus and lung: Secondary | ICD-10-CM

## 2018-12-09 DIAGNOSIS — J1289 Other viral pneumonia: Secondary | ICD-10-CM | POA: Diagnosis present

## 2018-12-09 DIAGNOSIS — J1282 Pneumonia due to coronavirus disease 2019: Secondary | ICD-10-CM | POA: Diagnosis present

## 2018-12-09 DIAGNOSIS — R739 Hyperglycemia, unspecified: Secondary | ICD-10-CM | POA: Diagnosis not present

## 2018-12-09 DIAGNOSIS — E874 Mixed disorder of acid-base balance: Secondary | ICD-10-CM | POA: Diagnosis not present

## 2018-12-09 DIAGNOSIS — Z888 Allergy status to other drugs, medicaments and biological substances status: Secondary | ICD-10-CM

## 2018-12-09 DIAGNOSIS — H1132 Conjunctival hemorrhage, left eye: Secondary | ICD-10-CM | POA: Diagnosis not present

## 2018-12-09 DIAGNOSIS — R7303 Prediabetes: Secondary | ICD-10-CM | POA: Diagnosis present

## 2018-12-09 DIAGNOSIS — I959 Hypotension, unspecified: Secondary | ICD-10-CM | POA: Diagnosis present

## 2018-12-09 DIAGNOSIS — Z79899 Other long term (current) drug therapy: Secondary | ICD-10-CM

## 2018-12-09 DIAGNOSIS — E039 Hypothyroidism, unspecified: Secondary | ICD-10-CM | POA: Diagnosis present

## 2018-12-09 DIAGNOSIS — Z87891 Personal history of nicotine dependence: Secondary | ICD-10-CM

## 2018-12-09 DIAGNOSIS — Z90710 Acquired absence of both cervix and uterus: Secondary | ICD-10-CM

## 2018-12-09 DIAGNOSIS — J80 Acute respiratory distress syndrome: Secondary | ICD-10-CM | POA: Diagnosis not present

## 2018-12-09 DIAGNOSIS — Z452 Encounter for adjustment and management of vascular access device: Secondary | ICD-10-CM

## 2018-12-09 DIAGNOSIS — G92 Toxic encephalopathy: Secondary | ICD-10-CM | POA: Diagnosis not present

## 2018-12-09 DIAGNOSIS — K449 Diaphragmatic hernia without obstruction or gangrene: Secondary | ICD-10-CM | POA: Diagnosis present

## 2018-12-09 DIAGNOSIS — R748 Abnormal levels of other serum enzymes: Secondary | ICD-10-CM | POA: Diagnosis not present

## 2018-12-09 DIAGNOSIS — Z79890 Hormone replacement therapy (postmenopausal): Secondary | ICD-10-CM

## 2018-12-09 DIAGNOSIS — E785 Hyperlipidemia, unspecified: Secondary | ICD-10-CM | POA: Diagnosis present

## 2018-12-09 DIAGNOSIS — Z515 Encounter for palliative care: Secondary | ICD-10-CM | POA: Diagnosis not present

## 2018-12-09 DIAGNOSIS — T380X5A Adverse effect of glucocorticoids and synthetic analogues, initial encounter: Secondary | ICD-10-CM | POA: Diagnosis not present

## 2018-12-09 DIAGNOSIS — D539 Nutritional anemia, unspecified: Secondary | ICD-10-CM | POA: Diagnosis present

## 2018-12-09 DIAGNOSIS — Z09 Encounter for follow-up examination after completed treatment for conditions other than malignant neoplasm: Secondary | ICD-10-CM

## 2018-12-09 DIAGNOSIS — Z8541 Personal history of malignant neoplasm of cervix uteri: Secondary | ICD-10-CM

## 2018-12-09 DIAGNOSIS — I248 Other forms of acute ischemic heart disease: Secondary | ICD-10-CM | POA: Diagnosis present

## 2018-12-09 DIAGNOSIS — S27399A Other injuries of lung, unspecified, initial encounter: Secondary | ICD-10-CM | POA: Diagnosis not present

## 2018-12-09 DIAGNOSIS — F410 Panic disorder [episodic paroxysmal anxiety] without agoraphobia: Secondary | ICD-10-CM | POA: Diagnosis not present

## 2018-12-09 DIAGNOSIS — I452 Bifascicular block: Secondary | ICD-10-CM | POA: Diagnosis not present

## 2018-12-09 DIAGNOSIS — E162 Hypoglycemia, unspecified: Secondary | ICD-10-CM | POA: Diagnosis not present

## 2018-12-09 DIAGNOSIS — J15212 Pneumonia due to Methicillin resistant Staphylococcus aureus: Secondary | ICD-10-CM | POA: Diagnosis not present

## 2018-12-09 DIAGNOSIS — R601 Generalized edema: Secondary | ICD-10-CM | POA: Diagnosis not present

## 2018-12-09 DIAGNOSIS — Z8261 Family history of arthritis: Secondary | ICD-10-CM

## 2018-12-09 DIAGNOSIS — E876 Hypokalemia: Secondary | ICD-10-CM | POA: Diagnosis present

## 2018-12-09 DIAGNOSIS — F419 Anxiety disorder, unspecified: Secondary | ICD-10-CM | POA: Diagnosis present

## 2018-12-09 DIAGNOSIS — T4275XA Adverse effect of unspecified antiepileptic and sedative-hypnotic drugs, initial encounter: Secondary | ICD-10-CM | POA: Diagnosis not present

## 2018-12-09 DIAGNOSIS — J189 Pneumonia, unspecified organism: Secondary | ICD-10-CM

## 2018-12-09 DIAGNOSIS — I480 Paroxysmal atrial fibrillation: Secondary | ICD-10-CM | POA: Diagnosis not present

## 2018-12-09 DIAGNOSIS — Z66 Do not resuscitate: Secondary | ICD-10-CM | POA: Diagnosis not present

## 2018-12-09 DIAGNOSIS — A4189 Other specified sepsis: Secondary | ICD-10-CM | POA: Diagnosis not present

## 2018-12-09 DIAGNOSIS — E875 Hyperkalemia: Secondary | ICD-10-CM | POA: Diagnosis not present

## 2018-12-09 DIAGNOSIS — Y95 Nosocomial condition: Secondary | ICD-10-CM | POA: Diagnosis not present

## 2018-12-09 DIAGNOSIS — D6489 Other specified anemias: Secondary | ICD-10-CM | POA: Diagnosis present

## 2018-12-09 DIAGNOSIS — Z820 Family history of epilepsy and other diseases of the nervous system: Secondary | ICD-10-CM

## 2018-12-09 DIAGNOSIS — K589 Irritable bowel syndrome without diarrhea: Secondary | ICD-10-CM | POA: Diagnosis present

## 2018-12-09 DIAGNOSIS — K219 Gastro-esophageal reflux disease without esophagitis: Secondary | ICD-10-CM | POA: Diagnosis present

## 2018-12-09 DIAGNOSIS — G9341 Metabolic encephalopathy: Secondary | ICD-10-CM | POA: Diagnosis not present

## 2018-12-09 DIAGNOSIS — E878 Other disorders of electrolyte and fluid balance, not elsewhere classified: Secondary | ICD-10-CM | POA: Diagnosis present

## 2018-12-09 DIAGNOSIS — D696 Thrombocytopenia, unspecified: Secondary | ICD-10-CM | POA: Diagnosis not present

## 2018-12-09 DIAGNOSIS — U071 COVID-19: Principal | ICD-10-CM | POA: Diagnosis present

## 2018-12-09 DIAGNOSIS — Z22322 Carrier or suspected carrier of Methicillin resistant Staphylococcus aureus: Secondary | ICD-10-CM

## 2018-12-09 DIAGNOSIS — Z880 Allergy status to penicillin: Secondary | ICD-10-CM

## 2018-12-09 DIAGNOSIS — Z7951 Long term (current) use of inhaled steroids: Secondary | ICD-10-CM

## 2018-12-09 DIAGNOSIS — Z881 Allergy status to other antibiotic agents status: Secondary | ICD-10-CM

## 2018-12-09 DIAGNOSIS — K5900 Constipation, unspecified: Secondary | ICD-10-CM | POA: Diagnosis not present

## 2018-12-09 DIAGNOSIS — R0902 Hypoxemia: Secondary | ICD-10-CM

## 2018-12-09 DIAGNOSIS — Z91018 Allergy to other foods: Secondary | ICD-10-CM

## 2018-12-09 DIAGNOSIS — Z86010 Personal history of colonic polyps: Secondary | ICD-10-CM

## 2018-12-09 DIAGNOSIS — R0600 Dyspnea, unspecified: Secondary | ICD-10-CM | POA: Diagnosis not present

## 2018-12-09 DIAGNOSIS — J9690 Respiratory failure, unspecified, unspecified whether with hypoxia or hypercapnia: Secondary | ICD-10-CM

## 2018-12-09 DIAGNOSIS — J9601 Acute respiratory failure with hypoxia: Secondary | ICD-10-CM

## 2018-12-09 DIAGNOSIS — L89816 Pressure-induced deep tissue damage of head: Secondary | ICD-10-CM | POA: Diagnosis not present

## 2018-12-09 DIAGNOSIS — J069 Acute upper respiratory infection, unspecified: Secondary | ICD-10-CM

## 2018-12-09 DIAGNOSIS — Z882 Allergy status to sulfonamides status: Secondary | ICD-10-CM

## 2018-12-09 DIAGNOSIS — D869 Sarcoidosis, unspecified: Secondary | ICD-10-CM | POA: Diagnosis present

## 2018-12-09 DIAGNOSIS — I952 Hypotension due to drugs: Secondary | ICD-10-CM | POA: Diagnosis not present

## 2018-12-09 LAB — BASIC METABOLIC PANEL
Anion gap: 10 (ref 5–15)
BUN: 20 mg/dL (ref 8–23)
CO2: 22 mmol/L (ref 22–32)
Calcium: 8.9 mg/dL (ref 8.9–10.3)
Chloride: 105 mmol/L (ref 98–111)
Creatinine, Ser: 1.16 mg/dL — ABNORMAL HIGH (ref 0.44–1.00)
GFR calc Af Amer: 58 mL/min — ABNORMAL LOW (ref >60)
GFR calc non Af Amer: 50 mL/min — ABNORMAL LOW (ref >60)
Glucose, Bld: 126 mg/dL — ABNORMAL HIGH (ref 70–99)
Potassium: 4.4 mmol/L (ref 3.5–5.1)
Sodium: 137 mmol/L (ref 135–145)

## 2018-12-09 LAB — CBC
HCT: 44 % (ref 36.0–46.0)
Hemoglobin: 14.7 g/dL (ref 12.0–15.0)
MCH: 30.6 pg (ref 26.0–34.0)
MCHC: 33.4 g/dL (ref 30.0–36.0)
MCV: 91.7 fL (ref 80.0–100.0)
Platelets: 233 10*3/uL (ref 150–400)
RBC: 4.8 MIL/uL (ref 3.87–5.11)
RDW: 13.2 % (ref 11.5–15.5)
WBC: 9.7 10*3/uL (ref 4.0–10.5)
nRBC: 0 % (ref 0.0–0.2)

## 2018-12-09 MED ORDER — OXYCODONE-ACETAMINOPHEN 5-325 MG PO TABS
1.0000 | ORAL_TABLET | Freq: Once | ORAL | Status: AC
  Administered 2018-12-09: 1 via ORAL
  Filled 2018-12-09: qty 1

## 2018-12-09 MED ORDER — IOHEXOL 350 MG/ML SOLN
80.0000 mL | Freq: Once | INTRAVENOUS | Status: AC | PRN
  Administered 2018-12-09: 80 mL via INTRAVENOUS

## 2018-12-09 NOTE — ED Triage Notes (Signed)
Pt reports increased SOB and cough, tested positive for COVID on Friday, 88% on room air, 2l [place] applied in triage. Pt a.o, resp e.u at this time.

## 2018-12-09 NOTE — ED Provider Notes (Signed)
[facility] EMERGENCY DEPARTMENT Provider Note   CSN: [id number] Arrival date & time: 12/03/2018  1901     History   Chief Complaint Chief Complaint  Patient presents with   Shortness of Breath   Cough    HPI Kim Harrison is a 63 y.o. female past medical history of sarcoidosis who presents for evaluation of cough, shortness of breath that is been progressively worsening.  Patient states that about a week ago, she started having some cough, congestion, fevers.  She was seen at urgent care and chest x-ray was concerning for pneumonia.  She had a syncopal episode at urgent care was taken to the ED.  On 12/06/2018, she was seen in the ED and diagnosed with Covid.  She was discharged home with prednisone which she states she has been taking.  She comes in today for progressive worsening dyspnea on exertion and low O2 sats.  She states she has been monitoring her pulse ox at home and states that while at home, it has dropped down to the high 80s.  She also reports worsening dyspnea on exertion.  No chest pain.  She states her fever has improved and she is abdominal pain, nausea/vomiting.  He states she is not on any blood thinners.  She denies any recent surgeries, travel, hospitalizations, leg swelling, history of blood clots.  She does report that she takes an over-the-counter " estrogen/progesterone pill."      The history is provided by the patient.    Past Medical History:  Diagnosis Date   Cervical cancer [facility])    Sarcoidosis     Patient Active Problem List   Diagnosis Date Noted   Chronic cough 01/07/2014   DOE (dyspnea on exertion) 05/15/2013   Mediastinal lymphadenopathy 05/15/2013   Diarrhea 11/25/2007   Abdominal pain, epigastric 11/25/2007   PANIC DISORDER 05/17/2007   GERD 05/17/2007   HIATAL HERNIA 05/17/2007   IRRITABLE BOWEL SYNDROME 05/17/2007   CERVICAL CANCER, HX OF 05/17/2007   Personal history of colonic polyps 05/17/2007      Past Surgical History:  Procedure Laterality Date   [address] [place]     OB History   No obstetric history on file.      Home Medications    Prior to Admission medications   Medication Sig Start Date End Date Taking? Authorizing Provider  dexlansoprazole (DEXILANT) 60 MG capsule Take 60 mg by mouth daily.    [provider]  EPINEPHrine (EPIPEN 2-PAK) 0.3 mg/0.3 mL IJ SOAJ injection Inject 0.3 mg into the muscle once.    [provider]  magic mouthwash SOLN Take 5 mLs by mouth 3 (three) times daily as needed (throat pain). 07/23/15   Horton, Barbette Hair, MD  mometasone (ASMANEX 120 METERED DOSES) 220 MCG/INH inhaler Inhale 1 puff into the lungs daily.    [provider]  NEXIUM 40 MG capsule Take 1 capsule by mouth daily. 05/13/13   [provider]  pravastatin (PRAVACHOL) 40 MG tablet Take 1 tablet by mouth daily. 05/13/13   [provider]  SYNTHROID 75 MCG tablet Take 1 tablet by mouth daily. 05/13/13   [provider]    Family History Family History  Problem Relation Age of Onset   Rheum arthritis Mother    Allergies Mother    Breast cancer Mother    Pulmonary fibrosis Mother        deceased   Multiple sclerosis Sister  Lung cancer Maternal Grandfather    Lung cancer Cousin    Lung cancer Maternal Uncle    Lung cancer Maternal Uncle    Lung cancer Maternal Uncle    Autoimmune disease Cousin     Social History Social History   Tobacco Use   Smoking status: Former Smoker    Packs/day: 3.00    Years: 0.00    Pack years: 0.00    Types: Cigarettes    Quit date: 01/31/1992    Years since quitting: 26.8   Tobacco comment: pt reports smoking 2-3 PPD-- 3 PPD at heaviest  Substance Use Topics   Alcohol use: Yes   Drug use: No     Allergies   Cephalexin, Penicillins, and Sulfonamide derivatives   Review of Systems Review of Systems   Constitutional: Negative for fever.  Respiratory: Positive for cough and shortness of breath.   Cardiovascular: Negative for chest pain and leg swelling.  Gastrointestinal: Negative for abdominal pain, nausea and vomiting.  Genitourinary: Negative for dysuria and hematuria.  Neurological: Negative for headaches.  All other systems reviewed and are negative.    Physical Exam Updated Vital Signs BP 116/73 (BP Location: Left Arm)    Pulse 79    Temp 98.1 F (36.7 C) (Oral)    Resp 20    Ht 5\' 5"  (1.651 m)    Wt 72.6 kg    SpO2 94%    BMI 26.63 kg/m   Physical Exam Vitals signs and nursing note reviewed.  Constitutional:      Appearance: Normal appearance. She is well-developed.  HENT:     Head: Normocephalic and atraumatic.  Eyes:     General: Lids are normal.     Conjunctiva/sclera: Conjunctivae normal.     Pupils: Pupils are equal, round, and reactive to light.  Neck:     Musculoskeletal: Full passive range of motion without pain.  Cardiovascular:     Rate and Rhythm: Normal rate and regular rhythm.     Pulses: Normal pulses.     Heart sounds: Normal heart sounds. No murmur. No friction rub. No gallop.   Pulmonary:     Effort: Tachypnea present.     Breath sounds: Normal breath sounds.     Comments: Mild increased work of breathing.  Diffuse crackles noted throughout lung fields. Abdominal:     Palpations: Abdomen is soft. Abdomen is not rigid.     Tenderness: There is no abdominal tenderness. There is no guarding.     Comments: Abdomen is soft, non-distended, non-tender. No rigidity, No guarding. No peritoneal signs.  Musculoskeletal: Normal range of motion.  Skin:    General: Skin is warm and dry.     Capillary Refill: Capillary refill takes less than 2 seconds.  Neurological:     Mental Status: She is alert and oriented to person, place, and time.  Psychiatric:        Speech: Speech normal.      ED Treatments / Results  Labs (all labs ordered are listed, but  only abnormal results are displayed) Labs Reviewed  BASIC METABOLIC PANEL - Abnormal; Notable for the following components:      Result Value   Glucose, Bld 126 (*)    Creatinine, Ser 1.16 (*)    GFR calc non Af Amer 50 (*)    GFR calc Af Amer 58 (*)    All other components within normal limits  CBC    EKG None  Radiology Ct Angio Chest Pe W  And/or Wo Contrast  Result Date: 12/07/2018 CLINICAL DATA:  Shortness of breath, covid positive EXAM: CT ANGIOGRAPHY CHEST WITH CONTRAST TECHNIQUE: Multidetector CT imaging of the chest was performed using the standard protocol during bolus administration of intravenous contrast. Multiplanar CT image reconstructions and MIPs were obtained to evaluate the vascular anatomy. CONTRAST:  18mL OMNIPAQUE IOHEXOL 350 MG/ML SOLN COMPARISON:  None. FINDINGS: Cardiovascular: There is a optimal opacification of the pulmonary arteries. There is no central,segmental, or subsegmental filling defects within the pulmonary arteries. The heart is normal in size. No pericardial effusion or thickening. No evidence right heart strain. There is normal three-vessel brachiocephalic anatomy without proximal stenosis. Minimal aortic arch calcifications are seen. Mediastinum/Nodes: Scattered pretracheal, subcarinal, and right hilar lymph nodes are noted the largest measuring 1.3 cm in the subcarinal region. Thyroid gland, trachea, and esophagus demonstrate no significant findings. Lungs/Pleura: Mild centrilobular emphysematous changes are seen at both lung apices. There is interlobular septal thickening with ground-glass opacities seen throughout both lungs, predominantly within the lower lobes. No pleural effusion is seen. No large airspace consolidation is seen. Upper Abdomen: No acute abnormalities present in the visualized portions of the upper abdomen. Musculoskeletal: No chest wall abnormality. No acute or significant osseous findings. Review of the MIP images confirms the above  findings. IMPRESSION: No central, segmental, or subsegmental pulmonary embolism. Interlobular septal thickening and there are a spectrum of findings in the lungs which can be seen with acute viral atypical infection (as well as other non-infectious etiologies such as edema). Mild centrilobular emphysematous changes in both lung apices. Electronically Signed   By: Prudencio Pair M.D.   On: 12/08/2018 22:09   Dg Chest Portable 1 View  Result Date: 12/06/2018 CLINICAL DATA:  Shortness of breath and COVID-19 positivity EXAM: PORTABLE CHEST 1 VIEW COMPARISON:  12/06/2018 FINDINGS: Cardiac shadow is stable. The lungs are well aerated bilaterally. Increasing interstitial markings are noted throughout the mid and lower lung fields bilaterally consistent with the patient's given clinical history of COVID-19 positivity. No focal confluent infiltrate is seen. No effusion is noted. No bony abnormality is seen. IMPRESSION: Increasing interstitial markings consistent with the patient's given clinical history of COVID-19 positivity. Electronically Signed   By: Inez Catalina M.D.   On: 12/24/2018 20:31    Procedures Procedures (including critical care time)  Medications Ordered in ED Medications  iohexol (OMNIPAQUE) 350 MG/ML injection 80 mL (80 mLs Intravenous Contrast Given 12/12/2018 2156)  oxyCODONE-acetaminophen (PERCOCET/ROXICET) 5-325 MG per tablet 1 tablet (1 tablet Oral Given 12/26/2018 2351)     Initial Impression / Assessment and Plan / ED Course  I have reviewed the triage vital signs and the nursing notes.  Pertinent labs & imaging results that were available during my care of the patient were reviewed by me and considered in my medical decision making (see chart for details).        63 year old female who presents for evaluation of dyspnea on exertion, cough.  Reports recent diagnosis of Covid and was given prednisone to take.  She states that she comes in today because over the weekend, she has had  continued dyspnea on exertion and states her pulse ox is dropping to the high 80s.  No history of blood clots.  She has not any leg swelling.  She states that she is on a "progesterone/estrogen pill" but does not know exactly what it is.  On initial ED arrival, she is afebrile, appears uncomfortable but no acute distress.  Her initial O2 sat was 88%  on room air.  She was placed on 2 L O2 which bumped her to 95.  She has no oxygen requirement.  Concern for worsening COVID-19 infection.  Also consider PE given her hypoxia as well as estrogen pill.  Plan to check labs, chest x-ray.  CBC with no significant leukocytosis or anemia.  BMP shows BUN of 20, creatinine of 0.116.  Chest x-ray shows multifocal pneumonia consistent with atypical infection.  CTA of chest confirms multifocal pneumonia findings.  No evidence of PE.  Given that she has an oxygen requirement and has symptomatic dyspnea on exertion, feel that she needs admission.  Discussed patient with Dr. Hal Hope (hosiptalist) who accepts patient for admission.  Portions of this note were generated with [employer]. Dictation errors may occur despite best attempts at proofreading.  Final Clinical Impressions(s) / ED Diagnoses   Final diagnoses:  Dyspnea, unspecified type  COVID-19    ED Discharge Orders    None       Desma Mcgregor 12/04/2018 2359    Davonna Belling, MD 12/12/18 [phone]

## 2018-12-10 ENCOUNTER — Encounter (HOSPITAL_COMMUNITY): Payer: Self-pay | Admitting: Internal Medicine

## 2018-12-10 DIAGNOSIS — Z515 Encounter for palliative care: Secondary | ICD-10-CM | POA: Diagnosis not present

## 2018-12-10 DIAGNOSIS — I248 Other forms of acute ischemic heart disease: Secondary | ICD-10-CM | POA: Diagnosis present

## 2018-12-10 DIAGNOSIS — F419 Anxiety disorder, unspecified: Secondary | ICD-10-CM | POA: Diagnosis not present

## 2018-12-10 DIAGNOSIS — L89816 Pressure-induced deep tissue damage of head: Secondary | ICD-10-CM | POA: Diagnosis not present

## 2018-12-10 DIAGNOSIS — J069 Acute upper respiratory infection, unspecified: Secondary | ICD-10-CM | POA: Diagnosis not present

## 2018-12-10 DIAGNOSIS — D869 Sarcoidosis, unspecified: Secondary | ICD-10-CM | POA: Diagnosis not present

## 2018-12-10 DIAGNOSIS — G9341 Metabolic encephalopathy: Secondary | ICD-10-CM | POA: Diagnosis not present

## 2018-12-10 DIAGNOSIS — S27399A Other injuries of lung, unspecified, initial encounter: Secondary | ICD-10-CM | POA: Diagnosis not present

## 2018-12-10 DIAGNOSIS — K449 Diaphragmatic hernia without obstruction or gangrene: Secondary | ICD-10-CM | POA: Diagnosis present

## 2018-12-10 DIAGNOSIS — J9601 Acute respiratory failure with hypoxia: Secondary | ICD-10-CM | POA: Diagnosis present

## 2018-12-10 DIAGNOSIS — Z66 Do not resuscitate: Secondary | ICD-10-CM | POA: Diagnosis not present

## 2018-12-10 DIAGNOSIS — R579 Shock, unspecified: Secondary | ICD-10-CM | POA: Diagnosis not present

## 2018-12-10 DIAGNOSIS — I452 Bifascicular block: Secondary | ICD-10-CM | POA: Diagnosis not present

## 2018-12-10 DIAGNOSIS — E039 Hypothyroidism, unspecified: Secondary | ICD-10-CM | POA: Diagnosis not present

## 2018-12-10 DIAGNOSIS — J1282 Pneumonia due to coronavirus disease 2019: Secondary | ICD-10-CM | POA: Diagnosis present

## 2018-12-10 DIAGNOSIS — U071 COVID-19: Secondary | ICD-10-CM | POA: Diagnosis present

## 2018-12-10 DIAGNOSIS — G92 Toxic encephalopathy: Secondary | ICD-10-CM | POA: Diagnosis not present

## 2018-12-10 DIAGNOSIS — E873 Alkalosis: Secondary | ICD-10-CM | POA: Diagnosis not present

## 2018-12-10 DIAGNOSIS — R0603 Acute respiratory distress: Secondary | ICD-10-CM | POA: Diagnosis not present

## 2018-12-10 DIAGNOSIS — J1289 Other viral pneumonia: Secondary | ICD-10-CM | POA: Diagnosis present

## 2018-12-10 DIAGNOSIS — K219 Gastro-esophageal reflux disease without esophagitis: Secondary | ICD-10-CM | POA: Diagnosis present

## 2018-12-10 DIAGNOSIS — D696 Thrombocytopenia, unspecified: Secondary | ICD-10-CM | POA: Diagnosis not present

## 2018-12-10 DIAGNOSIS — Y95 Nosocomial condition: Secondary | ICD-10-CM | POA: Diagnosis not present

## 2018-12-10 DIAGNOSIS — E878 Other disorders of electrolyte and fluid balance, not elsewhere classified: Secondary | ICD-10-CM | POA: Diagnosis present

## 2018-12-10 DIAGNOSIS — J15212 Pneumonia due to Methicillin resistant Staphylococcus aureus: Secondary | ICD-10-CM | POA: Diagnosis not present

## 2018-12-10 DIAGNOSIS — E874 Mixed disorder of acid-base balance: Secondary | ICD-10-CM | POA: Diagnosis not present

## 2018-12-10 DIAGNOSIS — R6521 Severe sepsis with septic shock: Secondary | ICD-10-CM | POA: Diagnosis not present

## 2018-12-10 DIAGNOSIS — R0600 Dyspnea, unspecified: Secondary | ICD-10-CM | POA: Diagnosis present

## 2018-12-10 DIAGNOSIS — Z9911 Dependence on respirator [ventilator] status: Secondary | ICD-10-CM | POA: Diagnosis not present

## 2018-12-10 DIAGNOSIS — I952 Hypotension due to drugs: Secondary | ICD-10-CM | POA: Diagnosis not present

## 2018-12-10 DIAGNOSIS — F410 Panic disorder [episodic paroxysmal anxiety] without agoraphobia: Secondary | ICD-10-CM | POA: Diagnosis not present

## 2018-12-10 DIAGNOSIS — J80 Acute respiratory distress syndrome: Secondary | ICD-10-CM | POA: Diagnosis not present

## 2018-12-10 DIAGNOSIS — E870 Hyperosmolality and hypernatremia: Secondary | ICD-10-CM | POA: Diagnosis not present

## 2018-12-10 DIAGNOSIS — I9589 Other hypotension: Secondary | ICD-10-CM | POA: Diagnosis not present

## 2018-12-10 DIAGNOSIS — I361 Nonrheumatic tricuspid (valve) insufficiency: Secondary | ICD-10-CM | POA: Diagnosis not present

## 2018-12-10 DIAGNOSIS — J189 Pneumonia, unspecified organism: Secondary | ICD-10-CM | POA: Diagnosis not present

## 2018-12-10 DIAGNOSIS — A4189 Other specified sepsis: Secondary | ICD-10-CM | POA: Diagnosis not present

## 2018-12-10 LAB — CBC WITH DIFFERENTIAL/PLATELET
Abs Immature Granulocytes: 0.06 10*3/uL (ref 0.00–0.07)
Basophils Absolute: 0 10*3/uL (ref 0.0–0.1)
Basophils Relative: 0 %
Eosinophils Absolute: 0 10*3/uL (ref 0.0–0.5)
Eosinophils Relative: 0 %
HCT: 40.6 % (ref 36.0–46.0)
Hemoglobin: 13.4 g/dL (ref 12.0–15.0)
Immature Granulocytes: 1 %
Lymphocytes Relative: 10 %
Lymphs Abs: 0.9 10*3/uL (ref 0.7–4.0)
MCH: 30.5 pg (ref 26.0–34.0)
MCHC: 33 g/dL (ref 30.0–36.0)
MCV: 92.3 fL (ref 80.0–100.0)
Monocytes Absolute: 0.2 10*3/uL (ref 0.1–1.0)
Monocytes Relative: 2 %
Neutro Abs: 7.8 10*3/uL — ABNORMAL HIGH (ref 1.7–7.7)
Neutrophils Relative %: 87 %
Platelets: 207 10*3/uL (ref 150–400)
RBC: 4.4 MIL/uL (ref 3.87–5.11)
RDW: 13.4 % (ref 11.5–15.5)
WBC: 9 10*3/uL (ref 4.0–10.5)
nRBC: 0 % (ref 0.0–0.2)

## 2018-12-10 LAB — TROPONIN I (HIGH SENSITIVITY)
Troponin I (High Sensitivity): 7 ng/L (ref <18)
Troponin I (High Sensitivity): 5 ng/L (ref <18)

## 2018-12-10 LAB — COMPREHENSIVE METABOLIC PANEL
ALT: 51 U/L — ABNORMAL HIGH (ref 0–44)
AST: 43 U/L — ABNORMAL HIGH (ref 15–41)
Albumin: 2.6 g/dL — ABNORMAL LOW (ref 3.5–5.0)
Alkaline Phosphatase: 57 U/L (ref 38–126)
Anion gap: 9 (ref 5–15)
BUN: 20 mg/dL (ref 8–23)
CO2: 21 mmol/L — ABNORMAL LOW (ref 22–32)
Calcium: 8.7 mg/dL — ABNORMAL LOW (ref 8.9–10.3)
Chloride: 104 mmol/L (ref 98–111)
Creatinine, Ser: 0.96 mg/dL (ref 0.44–1.00)
GFR calc Af Amer: >60 mL/min (ref >60)
GFR calc non Af Amer: >60 mL/min (ref >60)
Glucose, Bld: 100 mg/dL — ABNORMAL HIGH (ref 70–99)
Potassium: 4 mmol/L (ref 3.5–5.1)
Sodium: 134 mmol/L — ABNORMAL LOW (ref 135–145)
Total Bilirubin: 0.6 mg/dL (ref 0.3–1.2)
Total Protein: 6.2 g/dL — ABNORMAL LOW (ref 6.5–8.1)

## 2018-12-10 LAB — ABO/RH: ABO/RH(D): A POS

## 2018-12-10 LAB — HIV ANTIBODY (ROUTINE TESTING W REFLEX): HIV Screen 4th Generation wRfx: NONREACTIVE

## 2018-12-10 LAB — FERRITIN: Ferritin: 272 ng/mL (ref 11–307)

## 2018-12-10 LAB — C-REACTIVE PROTEIN: CRP: 12 mg/dL — ABNORMAL HIGH (ref <1.0)

## 2018-12-10 MED ORDER — ZINC SULFATE 220 (50 ZN) MG PO CAPS
220.0000 mg | ORAL_CAPSULE | Freq: Every day | ORAL | Status: DC
  Administered 2018-12-10 – 2018-12-30 (×21): 220 mg via ORAL
  Filled 2018-12-10 (×21): qty 1

## 2018-12-10 MED ORDER — GUAIFENESIN-DM 100-10 MG/5ML PO SYRP
5.0000 mL | ORAL_SOLUTION | Freq: Two times a day (BID) | ORAL | Status: DC | PRN
  Administered 2018-12-11 – 2018-12-16 (×3): 5 mL via ORAL
  Filled 2018-12-10 (×5): qty 5

## 2018-12-10 MED ORDER — FLUTICASONE PROPIONATE HFA 110 MCG/ACT IN AERO
1.0000 | INHALATION_SPRAY | Freq: Two times a day (BID) | RESPIRATORY_TRACT | Status: DC
  Administered 2018-12-10 – 2018-12-14 (×8): 1 via RESPIRATORY_TRACT
  Filled 2018-12-10 (×2): qty 12

## 2018-12-10 MED ORDER — PANTOPRAZOLE SODIUM 40 MG PO TBEC
40.0000 mg | DELAYED_RELEASE_TABLET | Freq: Every day | ORAL | Status: DC
  Administered 2018-12-10 – 2018-12-30 (×21): 40 mg via ORAL
  Filled 2018-12-10 (×21): qty 1

## 2018-12-10 MED ORDER — ENOXAPARIN SODIUM 40 MG/0.4ML ~~LOC~~ SOLN: 40.0000 mg | Freq: Every day | SUBCUTANEOUS | Status: DC

## 2018-12-10 MED ORDER — DEXAMETHASONE SODIUM PHOSPHATE 10 MG/ML IJ SOLN
6.0000 mg | Freq: Every day | INTRAMUSCULAR | Status: DC
  Administered 2018-12-10 – 2018-12-12 (×4): 6 mg via INTRAVENOUS
  Filled 2018-12-10 (×4): qty 1

## 2018-12-10 MED ORDER — LEVOTHYROXINE SODIUM 75 MCG PO TABS
75.0000 ug | ORAL_TABLET | Freq: Every day | ORAL | Status: DC
  Administered 2018-12-10 – 2018-12-31 (×22): 75 ug via ORAL
  Filled 2018-12-10 (×24): qty 1

## 2018-12-10 MED ORDER — FONDAPARINUX SODIUM 2.5 MG/0.5ML ~~LOC~~ SOLN
2.5000 mg | Freq: Every day | SUBCUTANEOUS | Status: DC
  Administered 2018-12-11 – 2019-01-17 (×38): 2.5 mg via SUBCUTANEOUS
  Filled 2018-12-10 (×41): qty 0.5

## 2018-12-10 MED ORDER — PRAVASTATIN SODIUM 40 MG PO TABS
40.0000 mg | ORAL_TABLET | Freq: Every day | ORAL | Status: DC
  Administered 2018-12-13 – 2018-12-14 (×2): 40 mg via ORAL
  Filled 2018-12-10 (×7): qty 1

## 2018-12-10 MED ORDER — SODIUM CHLORIDE 0.9 % IV SOLN
200.0000 mg | Freq: Once | INTRAVENOUS | Status: AC
  Administered 2018-12-10: 200 mg via INTRAVENOUS
  Filled 2018-12-10 (×2): qty 40

## 2018-12-10 MED ORDER — SODIUM CHLORIDE 0.9 % IV SOLN
100.0000 mg | INTRAVENOUS | Status: AC
  Administered 2018-12-11 – 2018-12-14 (×4): 100 mg via INTRAVENOUS
  Filled 2018-12-10 (×2): qty 20
  Filled 2018-12-10 (×2): qty 100

## 2018-12-10 MED ORDER — VITAMIN C 500 MG PO TABS
500.0000 mg | ORAL_TABLET | Freq: Every day | ORAL | Status: DC
  Administered 2018-12-10 – 2018-12-30 (×21): 500 mg via ORAL
  Filled 2018-12-10 (×21): qty 1

## 2018-12-10 NOTE — Progress Notes (Signed)
Patients husband Shanon Brow updated on patient status. He is understanding of patients care and would like to be updated every shift on patient condition. I will continue to provide updates as needed and provide support for patient and family.

## 2018-12-10 NOTE — ED Notes (Signed)
Ordered diet tray for pt  

## 2018-12-10 NOTE — Progress Notes (Signed)
PROGRESS NOTE    Kim Harrison  [id number] DOB: [birth year] DOA: 12/25/2018 PCP: Lou Miner, MD    Brief Narrative:  63 y.o. female with history of sarcoidosis on observation, hypothyroidism, hyperlipidemia has been experiencing fever chills over the last 10 days.  Had gone to urgent care center about 10 days ago and was prescribed antibiotic at that time patient states her COVID-19 test was negative.  Patient states after taking the steroids and antibiotics patient symptoms improved but on November 6 patient symptoms became back again.  At this time patient again followed up with urgent care center.  At that time patient was receiving some intramuscular injection following which patient had a syncopal episode and was taken to [facility] patient was diagnosed with COVID-19 pneumonia and was placed on prednisone.  Patient has been taking prednisone for last 3 days.  Patient started develop short of breath with cough which is nonproductive and has benign weakness fever and chills.  Patient shortness of breath has progressed and decided to come to the ER.  Assessment & Plan:   Principal Problem:   Acute respiratory failure with hypoxia (HCC) Active Problems:   Pneumonia due to COVID-19 virus   Hypothyroidism   Sarcoidosis  1. Acute respiratory failure with hypoxia secondary to COVID-19 pneumonia 1. Test was positive on December 06, 2018 at [facility].   2. Patient continued on IV Decadron and remdesivir. 3. Currently sob with tachypnea, on 3LNC 4. Continued on remdesivir and dexamethasone IV 5. Will add vit c and zinc 6. Presenting CRP of 12 7. Follow inflammatory marker trends 2. Hypothyroidism 1. Continue on thyroid replacement as tolerated 2. Will check TSH in AM 3. Hyperlipidemia 1. Continue on pravastatin as tolerated 2. Seems stable at this time 4. History of sarcoidosis 1. 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 Code Status: Full Family Communication: pt in room, family not at bedside Disposition Plan: Uncertain at this time  Consultants:     Procedures:     Antimicrobials: Anti-infectives (From admission, onward)   Start     Dose/Rate Route Frequency Ordered Stop   12/11/18 1000  remdesivir 100 mg in sodium chloride 0.9 % 250 mL IVPB     100 mg 500 mL/hr over 30 Minutes Intravenous Every 24 hours 12/10/18 0224 12/15/18 0959   12/10/18 0300  remdesivir 200 mg in sodium chloride 0.9 % 250 mL IVPB     200 mg 500 mL/hr over 30 Minutes Intravenous Once 12/10/18 0224 12/10/18 0727       Subjective: Still sob this AM. No chest pain  Objective: Vitals:   12/10/18 0615 12/10/18 0630 12/10/18 1100 12/10/18 1300  BP:   110/69 107/72  Pulse:   72 73  Resp:   (!) 25 (!) 27  Temp:      TempSrc:      SpO2: 93% 92% 92% 92%  Weight:      Height:       No intake or output data in the 24 hours ending 12/10/18 1526 Filed Weights   12/17/2018 1930  Weight: 72.6 kg    Examination:  General exam: Appears calm and comfortable  Respiratory system: no audible wheezing. Respiratory effort normal. Cardiovascular system: S1 & S2 heard, regular Gastrointestinal system: nondistended, soft Central nervous system: Alert and oriented. No focal neurological deficits. Extremities: Symmetric 5 x 5 power. Skin: No rashes, lesions  Psychiatry: Judgement and insight appear normal. Mood & affect appropriate.   Data Reviewed: I have personally reviewed following labs and imaging studies  CBC: Recent Labs  Lab 12/10/2018 1944 12/10/18 0359  WBC 9.7 9.0  NEUTROABS  --  7.8*  HGB 14.7 13.4  HCT 44.0 40.6  MCV 91.7 92.3  PLT 233 [id number]   Basic Metabolic Panel: Recent Labs  Lab 12/13/2018 1944 12/10/18 0208  NA 137 134*  K 4.4 4.0  CL 105 104  CO2 22 21*  GLUCOSE 126* 100*  BUN 20 20  CREATININE 1.16* 0.96  CALCIUM 8.9  8.7*   GFR: Estimated Creatinine Clearance: 59.1 mL/min (by C-G formula based on SCr of 0.96 mg/dL). Liver Function Tests: Recent Labs  Lab 12/10/18 0208  AST 43*  ALT 51*  ALKPHOS 57  BILITOT 0.6  PROT 6.2*  ALBUMIN 2.6*   No results for input(s): LIPASE, AMYLASE in the last 168 hours. No results for input(s): AMMONIA in the last 168 hours. Coagulation Profile: No results for input(s): INR, PROTIME in the last 168 hours. Cardiac Enzymes: No results for input(s): CKTOTAL, CKMB, CKMBINDEX, TROPONINI in the last 168 hours. BNP (last 3 results) No results for input(s): PROBNP in the last 8760 hours. HbA1C: No results for input(s): HGBA1C in the last 72 hours. CBG: No results for input(s): GLUCAP in the last 168 hours. Lipid Profile: No results for input(s): CHOL, HDL, LDLCALC, TRIG, CHOLHDL, LDLDIRECT in the last 72 hours. Thyroid Function Tests: No results for input(s): TSH, T4TOTAL, FREET4, T3FREE, THYROIDAB in the last 72 hours. Anemia Panel: Recent Labs    12/10/18 0208  FERRITIN 272   Sepsis Labs: No results for input(s): PROCALCITON, LATICACIDVEN in the last 168 hours.  No results found for this or any previous visit (from the past 240 hour(s)).   Radiology Studies: Ct Angio Chest Pe W And/or Wo Contrast  Result Date: 12/04/2018 CLINICAL DATA:  Shortness of breath, covid positive EXAM: CT ANGIOGRAPHY CHEST WITH CONTRAST TECHNIQUE: Multidetector CT imaging of the chest was performed using the standard protocol during bolus administration of intravenous contrast. Multiplanar CT image reconstructions and MIPs were obtained to evaluate the vascular anatomy. CONTRAST:  38mL OMNIPAQUE IOHEXOL 350 MG/ML SOLN COMPARISON:  None. FINDINGS: Cardiovascular: There is a optimal opacification of the pulmonary arteries. There is no central,segmental, or subsegmental filling defects within the pulmonary arteries. The heart is normal in size. No pericardial effusion or thickening. No  evidence right heart strain. There is normal three-vessel brachiocephalic anatomy without proximal stenosis. Minimal aortic arch calcifications are seen. Mediastinum/Nodes: Scattered pretracheal, subcarinal, and right hilar lymph nodes are noted the largest measuring 1.3 cm in the subcarinal region. Thyroid gland, trachea, and esophagus demonstrate no significant findings. Lungs/Pleura: Mild centrilobular emphysematous changes are seen at both lung apices. There is interlobular septal thickening with ground-glass opacities seen throughout both lungs, predominantly within the lower lobes. No pleural effusion is seen. No large airspace consolidation is seen. Upper Abdomen: No acute abnormalities present in the visualized portions of the upper abdomen. Musculoskeletal: No chest wall abnormality. No acute or significant osseous findings. Review of the MIP images confirms the above findings. IMPRESSION: No central, segmental, or subsegmental pulmonary embolism. Interlobular septal thickening and there are a spectrum of findings in the lungs which can be seen with acute viral atypical infection (as well as other non-infectious etiologies such as edema). Mild centrilobular emphysematous changes in both lung apices. Electronically Signed   By: Ebony Cargo.D.  On: 12/26/2018 22:09   Dg Chest Portable 1 View  Result Date: 12/15/2018 CLINICAL DATA:  Shortness of breath and COVID-19 positivity EXAM: PORTABLE CHEST 1 VIEW COMPARISON:  12/06/2018 FINDINGS: Cardiac shadow is stable. The lungs are well aerated bilaterally. Increasing interstitial markings are noted throughout the mid and lower lung fields bilaterally consistent with the patient's given clinical history of COVID-19 positivity. No focal confluent infiltrate is seen. No effusion is noted. No bony abnormality is seen. IMPRESSION: Increasing interstitial markings consistent with the patient's given clinical history of COVID-19 positivity. Electronically Signed    By: Inez Catalina M.D.   On: 12/30/2018 20:31    Scheduled Meds: . dexamethasone (DECADRON) injection  6 mg Intravenous Daily  . fluticasone  1 puff Inhalation BID  . fondaparinux (ARIXTRA) injection  2.5 mg Subcutaneous Q0600  . levothyroxine  75 mcg Oral Daily  . pantoprazole  40 mg Oral Daily  . pravastatin  40 mg Oral Daily   Continuous Infusions: . [START ON 12/11/2018] remdesivir 100 mg in NS 250 mL       LOS: 0 days   Marylu Lund, MD [facility] Pager On Amion  If 7PM-7AM, please contact night-coverage 12/10/2018, 3:26 PM

## 2018-12-10 NOTE — Progress Notes (Signed)
Patient seen this AM. Full note will follow.

## 2018-12-10 NOTE — Progress Notes (Signed)
Patient alert and oriented x4. Currently on 6L HFNC sat 91%. No s/s of pain or distress. Will continue to monitor for remainder of shift.

## 2018-12-10 NOTE — Progress Notes (Signed)
Patient O2 saturation dropped in the 60's patient turned to the prone position and O2 turned to 10L HFNC. Shanon Brow MD paged and made aware. No new orders. Since patient has been prone O2 sat have improved 93-97%. Will continue to monitor patient closely.

## 2018-12-10 NOTE — H&P (Signed)
History and Physical    Amparo Kanagy [id number] DOB: 11/12/1955 DOA: 12/27/2018  PCP: Lou Miner, MD  Patient coming from: Home.  Chief Complaint: Shortness of breath.  HPI: Kim Harrison is a 63 y.o. female with history of sarcoidosis on observation, hypothyroidism, hyperlipidemia has been experiencing fever chills over the last 10 days.  Had gone to urgent care center about 10 days ago and was prescribed antibiotic at that time patient states her COVID-19 test was negative.  Patient states after taking the steroids and antibiotics patient symptoms improved but on November 6 patient symptoms became back again.  At this time patient again followed up with urgent care center.  At that time patient was receiving some intramuscular injection following which patient had a syncopal episode and was taken to [facility] patient was diagnosed with COVID-19 pneumonia and was placed on prednisone.  Patient has been taking prednisone for last 3 days.  Patient started develop short of breath with cough which is nonproductive and has benign weakness fever and chills.  Patient shortness of breath has progressed and decided to come to the ER.  ED Course: In the ER patient has benign persistent cough and CT angiogram of the chest shows features concerning for atypical pneumonia.  Labs showed WBC count of 9.7 CRP 12 high-sensitivity troponin VII ferritin 272 creatinine 1.1 platelets 233 patient has a positive Covid test record which has been placed on chart review media center in this epic chart.  Patient will be started on Decadron remdesivir admitted for acute respiratory failure with hypoxia secondary to Covid pneumonia.  Review of Systems: As per HPI, rest all negative.   Past Medical History:  Diagnosis Date  . Cervical cancer ([place])   . Sarcoidosis     Past Surgical History:  Procedure Laterality Date  . ABDOMINAL HYSTERECTOMY  1989  . [place]     reports that she quit smoking about 26 years ago. Her smoking use included cigarettes. She smoked 3.00 packs per day for 0.00 years. She has never used smokeless tobacco. She reports current alcohol use. She reports that she does not use drugs.  Allergies  Allergen Reactions  . Cephalexin   . Penicillins   . Sulfonamide Derivatives     Family History  Problem Relation Age of Onset  . Rheum arthritis Mother   . Allergies Mother   . Breast cancer Mother   . Pulmonary fibrosis Mother        deceased  . Multiple sclerosis Sister   . Lung cancer Maternal Grandfather   . Lung cancer Cousin   . Lung cancer Maternal Uncle   . Lung cancer Maternal Uncle   . Lung cancer Maternal Uncle   . Autoimmune disease Cousin     Prior to Admission medications   Medication Sig Start Date End Date Taking? Authorizing Provider  dexlansoprazole (DEXILANT) 60 MG capsule Take 60 mg by mouth daily.    [provider]  EPINEPHrine (EPIPEN 2-PAK) 0.3 mg/0.3 mL IJ SOAJ injection Inject 0.3 mg into the muscle once.    [provider]  magic mouthwash SOLN Take 5 mLs by mouth 3 (three) times daily as needed (throat pain). 07/23/15   Horton, Barbette Hair, MD  mometasone (ASMANEX 120 METERED DOSES) 220 MCG/INH inhaler Inhale 1 puff into the lungs daily.    [provider]  NEXIUM 40 MG capsule Take 1 capsule by mouth daily. 05/13/13   [provider]  pravastatin (PRAVACHOL) 40 MG tablet Take 1 tablet by mouth daily. 05/13/13   [provider]  SYNTHROID 75 MCG tablet Take 1 tablet by mouth daily. 05/13/13   [provider]    Physical Exam: Constitutional: Moderately built and nourished. Vitals:   12/21/2018 2356 12/10/18 0000 12/10/18 0100 12/10/18 0115  BP: 116/73 (!) 103/58 104/67   Pulse: 79 76 80 75  Resp: 20     Temp:      TempSrc:      SpO2: 94% 93% 93% 95%  Weight:      Height:       Eyes: Anicteric no pallor. ENMT: No  discharge from the ears eyes nose or mouth. Neck: No mass felt.  No neck rigidity. Respiratory: No rhonchi or crepitations. Cardiovascular: S1-S2 heard. Abdomen: Soft nontender bowel sounds present. Musculoskeletal: No edema.  No joint effusion. Skin: No rash. Neurologic: Alert awake oriented to time place and person.  Moves all extremities. Psychiatric: Appears normal per normal affect.   Labs on Admission: I have personally reviewed following labs and imaging studies  CBC: Recent Labs  Lab 12/22/2018 1944  WBC 9.7  HGB 14.7  HCT 44.0  MCV 91.7  PLT 0000000   Basic Metabolic Panel: Recent Labs  Lab 12/04/2018 1944  NA 137  K 4.4  CL 105  CO2 22  GLUCOSE 126*  BUN 20  CREATININE 1.16*  CALCIUM 8.9   GFR: Estimated Creatinine Clearance: 48.9 mL/min (A) (by C-G formula based on SCr of 1.16 mg/dL (H)). Liver Function Tests: No results for input(s): AST, ALT, ALKPHOS, BILITOT, PROT, ALBUMIN in the last 168 hours. No results for input(s): LIPASE, AMYLASE in the last 168 hours. No results for input(s): AMMONIA in the last 168 hours. Coagulation Profile: No results for input(s): INR, PROTIME in the last 168 hours. Cardiac Enzymes: No results for input(s): CKTOTAL, CKMB, CKMBINDEX, TROPONINI in the last 168 hours. BNP (last 3 results) No results for input(s): PROBNP in the last 8760 hours. HbA1C: No results for input(s): HGBA1C in the last 72 hours. CBG: No results for input(s): GLUCAP in the last 168 hours. Lipid Profile: No results for input(s): CHOL, HDL, LDLCALC, TRIG, CHOLHDL, LDLDIRECT in the last 72 hours. Thyroid Function Tests: No results for input(s): TSH, T4TOTAL, FREET4, T3FREE, THYROIDAB in the last 72 hours. Anemia Panel: No results for input(s): VITAMINB12, FOLATE, FERRITIN, TIBC, IRON, RETICCTPCT in the last 72 hours. Urine analysis: No results found for: COLORURINE, APPEARANCEUR, LABSPEC, PHURINE, GLUCOSEU, HGBUR, BILIRUBINUR, KETONESUR, PROTEINUR,  UROBILINOGEN, NITRITE, LEUKOCYTESUR Sepsis Labs: @LABRCNTIP (procalcitonin:4,lacticidven:4) )No results found for this or any previous visit (from the past 240 hour(s)).   Radiological Exams on Admission: Ct Angio Chest Pe W And/or Wo Contrast  Result Date: 12/29/2018 CLINICAL DATA:  Shortness of breath, covid positive EXAM: CT ANGIOGRAPHY CHEST WITH CONTRAST TECHNIQUE: Multidetector CT imaging of the chest was performed using the standard protocol during bolus administration of intravenous contrast. Multiplanar CT image reconstructions and MIPs were obtained to evaluate the vascular anatomy. CONTRAST:  42mL OMNIPAQUE IOHEXOL 350 MG/ML SOLN COMPARISON:  None. FINDINGS: Cardiovascular: There is a optimal opacification of the pulmonary arteries. There is no central,segmental, or subsegmental filling defects within the pulmonary arteries. The heart is normal in size. No pericardial effusion or thickening. No evidence right heart strain. There is normal three-vessel brachiocephalic anatomy without proximal stenosis. Minimal aortic arch calcifications are seen. Mediastinum/Nodes: Scattered pretracheal, subcarinal, and right hilar lymph nodes are noted the largest measuring 1.3 cm in the  subcarinal region. Thyroid gland, trachea, and esophagus demonstrate no significant findings. Lungs/Pleura: Mild centrilobular emphysematous changes are seen at both lung apices. There is interlobular septal thickening with ground-glass opacities seen throughout both lungs, predominantly within the lower lobes. No pleural effusion is seen. No large airspace consolidation is seen. Upper Abdomen: No acute abnormalities present in the visualized portions of the upper abdomen. Musculoskeletal: No chest wall abnormality. No acute or significant osseous findings. Review of the MIP images confirms the above findings. IMPRESSION: No central, segmental, or subsegmental pulmonary embolism. Interlobular septal thickening and there are a  spectrum of findings in the lungs which can be seen with acute viral atypical infection (as well as other non-infectious etiologies such as edema). Mild centrilobular emphysematous changes in both lung apices. Electronically Signed   By: Prudencio Pair M.D.   On: 12/28/2018 22:09   Dg Chest Portable 1 View  Result Date: 12/16/2018 CLINICAL DATA:  Shortness of breath and COVID-19 positivity EXAM: PORTABLE CHEST 1 VIEW COMPARISON:  12/06/2018 FINDINGS: Cardiac shadow is stable. The lungs are well aerated bilaterally. Increasing interstitial markings are noted throughout the mid and lower lung fields bilaterally consistent with the patient's given clinical history of COVID-19 positivity. No focal confluent infiltrate is seen. No effusion is noted. No bony abnormality is seen. IMPRESSION: Increasing interstitial markings consistent with the patient's given clinical history of COVID-19 positivity. Electronically Signed   By: Inez Catalina M.D.   On: 12/13/2018 20:31     Assessment/Plan Principal Problem:   Acute respiratory failure with hypoxia (HCC) Active Problems:   Pneumonia due to COVID-19 virus   Hypothyroidism   Sarcoidosis    1. Acute respiratory failure with hypoxia secondary to COVID-19 pneumonia -patient's results are available in this chart in chart review media.  Test was positive on December 06, 2018 at [facility].  Patient is placed on IV Decadron and remdesivir. 2. Hypothyroidism on Synthroid. 3. Hyperlipidemia on statins. 4. History of sarcoidosis being observed.  Given that patient has acute respiratory failure with COVID-19 pneumonia will need inpatient status.   DVT prophylaxis: Lovenox. Code Status: Full code.   Family Communication: Discussed with patient. Disposition Plan: Home. Consults called: None. Admission status: Inpatient.   Rise Patience MD [facility] Pager [phone].  If 7PM-7AM, please contact night-coverage [email or web address] Password  [email or web address]  12/10/2018, 1:35 AM

## 2018-12-10 NOTE — ED Notes (Signed)
Lunch Tray Ordered @ 1102. 

## 2018-12-10 NOTE — ED Notes (Signed)
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 Requires epinephrine if ingests mammalian meat. Updated allergy list and canceled current meal tray and reordered appropriately.

## 2018-12-10 NOTE — ED Notes (Addendum)
ED TO INPATIENT HANDOFF REPORT  ED Nurse Name and Phone #: Thurmond Butts [place] Name/Age/Gender [name] 63 y.o. female Room/Bed: 012C/012C  Code Status   Code Status: Full Code  Home/SNF/Other Home Patient oriented to: self, place, time and situation Is this baseline? Yes   Triage Complete: Triage complete  Chief Complaint Covid-19 , [facility]  Triage Note Pt reports increased SOB and cough, tested positive for COVID on Friday, 88% on room air, 2l [place] applied in triage. Pt a.o, resp e.u at this time.    Allergies Allergies  Allergen Reactions  . 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  . Cephalexin Anaphylaxis  . Penicillins Anaphylaxis    Did it involve swelling of the face/tongue/throat, SOB, or low BP? Yes Did it involve sudden or severe rash/hives, skin peeling, or any reaction on the inside of your mouth or nose? No Did you need to seek medical attention at a hospital or doctor's office? Yes When did it last happen?couple of years ago If all above answers are "NO", may proceed with cephalosporin use.   . Sulfonamide Derivatives Hives    Level of Care/Admitting Diagnosis ED Disposition    ED Disposition Condition [facility] Area: [place] [[id number]]  Level of Care: Telemetry [5]  Covid Evaluation: Confirmed COVID Positive  Diagnosis: Acute respiratory failure with [id number]  Admitting Physician: Rise Patience [phone]  Attending Physician: Rise Patience [phone]  Estimated length of stay: past midnight tomorrow  Certification:: I certify this patient will need inpatient services for at least 2 midnights  PT Class (Do Not Modify): Inpatient [101]  PT Acc Code (Do Not Modify): Private [1]       B Medical/Surgery History Past Medical History:  Diagnosis Date  . Cervical cancer ([place])   . Sarcoidosis    Past Surgical History:  Procedure Laterality Date  .  ABDOMINAL HYSTERECTOMY  1989  . [place]     A IV Location/Drains/Wounds Patient Lines/Drains/Airways Status   Active Line/Drains/Airways    Name:   Placement date:   Placement time:   Site:   Days:   Peripheral IV 12/07/2018 Right Antecubital   12/22/2018    2116    Antecubital   1          Intake/Output Last 24 hours No intake or output data in the 24 hours ending 12/10/18 1230  Labs/Imaging Results for orders placed or performed during the hospital encounter of 12/20/2018 (from the past 48 hour(s))  Basic metabolic panel     Status: Abnormal   Collection Time: 12/01/2018  7:44 PM  Result Value Ref Range   Sodium 137 135 - 145 mmol/L   Potassium 4.4 3.5 - 5.1 mmol/L   Chloride 105 98 - 111 mmol/L   CO2 22 22 - 32 mmol/L   Glucose, Bld 126 (H) 70 - 99 mg/dL   BUN 20 8 - 23 mg/dL   Creatinine, Ser 1.16 (H) 0.44 - 1.00 mg/dL   Calcium 8.9 8.9 - 10.3 mg/dL   GFR calc non Af Amer 50 (L) >60 mL/min   GFR calc Af Amer 58 (L) >60 mL/min   Anion gap 10 5 - 15    Comment: Performed at [facility], [place] [address], [place], [place] [address]  CBC     Status: None   Collection Time: 12/30/2018  7:44 PM  Result Value Ref  Range   WBC 9.7 4.0 - 10.5 K/uL   RBC 4.80 3.87 - 5.11 MIL/uL   Hemoglobin 14.7 12.0 - 15.0 g/dL   HCT 44.0 36.0 - 46.0 %   MCV 91.7 80.0 - 100.0 fL   MCH 30.6 26.0 - 34.0 pg   MCHC 33.4 30.0 - 36.0 g/dL   RDW 13.2 11.5 - 15.5 %   Platelets 233 150 - 400 K/uL   nRBC 0.0 0.0 - 0.2 %    Comment: Performed at [facility], [place] [address], [place], [place] [address]  HIV Antibody (routine testing w rflx)     Status: None   Collection Time: 12/10/18  2:08 AM  Result Value Ref Range   HIV Screen 4th Generation wRfx NON REACTIVE NON REACTIVE    Comment: Performed at [facility] [address], [place], [place] [address]  C-reactive protein     Status: Abnormal   Collection Time: 12/10/18  2:08 AM  Result Value Ref Range   CRP 12.0 (H)  <1.0 mg/dL    Comment: Performed at [facility], [place] [address] [place], [place] [address]  Comprehensive metabolic panel     Status: Abnormal   Collection Time: 12/10/18  2:08 AM  Result Value Ref Range   Sodium 134 (L) 135 - 145 mmol/L   Potassium 4.0 3.5 - 5.1 mmol/L   Chloride 104 98 - 111 mmol/L   CO2 21 (L) 22 - 32 mmol/L   Glucose, Bld 100 (H) 70 - 99 mg/dL   BUN 20 8 - 23 mg/dL   Creatinine, Ser 0.96 0.44 - 1.00 mg/dL   Calcium 8.7 (L) 8.9 - 10.3 mg/dL   Total Protein 6.2 (L) 6.5 - 8.1 g/dL   Albumin 2.6 (L) 3.5 - 5.0 g/dL   AST 43 (H) 15 - 41 U/L   ALT 51 (H) 0 - 44 U/L   Alkaline Phosphatase 57 38 - 126 U/L   Total Bilirubin 0.6 0.3 - 1.2 mg/dL   GFR calc non Af Amer >60 >60 mL/min   GFR calc Af Amer >60 >60 mL/min   Anion gap 9 5 - 15    Comment: Performed at [facility], [place] [address] [place], [place] [address]  Ferritin     Status: None   Collection Time: 12/10/18  2:08 AM  Result Value Ref Range   Ferritin 272 11 - 307 ng/mL    Comment: Performed at [facility], [place] [address], [place], [place] [address]  Troponin I (High Sensitivity)     Status: None   Collection Time: 12/10/18  2:08 AM  Result Value Ref Range   Troponin I (High Sensitivity) 7 <18 ng/L    Comment: (NOTE) Elevated high sensitivity troponin I (hsTnI) values and significant  changes across serial measurements may suggest ACS but many other  chronic and acute conditions are known to elevate hsTnI results.  Refer to the Links section for chest pain algorithms and additional  guidance. Performed at [facility], [place] [address] [place], [place] [address]   ABO/Rh     Status: None   Collection Time: 12/10/18  3:59 AM  Result Value Ref Range   ABO/RH(D)      A POS Performed at [facility] [address] [place], [place] [address]   CBC with Differential/Platelet     Status: Abnormal   Collection Time: 12/10/18  3:59 AM  Result Value  Ref Range   WBC 9.0  4.0 - 10.5 K/uL   RBC 4.40 3.87 - 5.11 MIL/uL   Hemoglobin 13.4 12.0 - 15.0 g/dL   HCT 40.6 36.0 - 46.0 %   MCV 92.3 80.0 - 100.0 fL   MCH 30.5 26.0 - 34.0 pg   MCHC 33.0 30.0 - 36.0 g/dL   RDW 13.4 11.5 - 15.5 %   Platelets 207 150 - 400 K/uL   nRBC 0.0 0.0 - 0.2 %   Neutrophils Relative % 87 %   Neutro Abs 7.8 (H) 1.7 - 7.7 K/uL   Lymphocytes Relative 10 %   Lymphs Abs 0.9 0.7 - 4.0 K/uL   Monocytes Relative 2 %   Monocytes Absolute 0.2 0.1 - 1.0 K/uL   Eosinophils Relative 0 %   Eosinophils Absolute 0.0 0.0 - 0.5 K/uL   Basophils Relative 0 %   Basophils Absolute 0.0 0.0 - 0.1 K/uL   Immature Granulocytes 1 %   Abs Immature Granulocytes 0.06 0.00 - 0.07 K/uL    Comment: Performed at [facility] [address], [place], [place] [address]  Troponin I (High Sensitivity)     Status: None   Collection Time: 12/10/18  3:59 AM  Result Value Ref Range   Troponin I (High Sensitivity) 5 <18 ng/L    Comment: (NOTE) Elevated high sensitivity troponin I (hsTnI) values and significant  changes across serial measurements may suggest ACS but many other  chronic and acute conditions are known to elevate hsTnI results.  Refer to the "Links" section for chest pain algorithms and additional  guidance. Performed at [facility], [place] [address] [place], [place] [address]    Ct Angio Chest Pe W And/or Wo Contrast  Result Date: 12/15/2018 CLINICAL DATA:  Shortness of breath, covid positive EXAM: CT ANGIOGRAPHY CHEST WITH CONTRAST TECHNIQUE: Multidetector CT imaging of the chest was performed using the standard protocol during bolus administration of intravenous contrast. Multiplanar CT image reconstructions and MIPs were obtained to evaluate the vascular anatomy. CONTRAST:  13mL OMNIPAQUE IOHEXOL 350 MG/ML SOLN COMPARISON:  None. FINDINGS: Cardiovascular: There is a optimal opacification of the pulmonary arteries. There is no central,segmental, or subsegmental filling defects within  the pulmonary arteries. The heart is normal in size. No pericardial effusion or thickening. No evidence right heart strain. There is normal three-vessel brachiocephalic anatomy without proximal stenosis. Minimal aortic arch calcifications are seen. Mediastinum/Nodes: Scattered pretracheal, subcarinal, and right hilar lymph nodes are noted the largest measuring 1.3 cm in the subcarinal region. Thyroid gland, trachea, and esophagus demonstrate no significant findings. Lungs/Pleura: Mild centrilobular emphysematous changes are seen at both lung apices. There is interlobular septal thickening with ground-glass opacities seen throughout both lungs, predominantly within the lower lobes. No pleural effusion is seen. No large airspace consolidation is seen. Upper Abdomen: No acute abnormalities present in the visualized portions of the upper abdomen. Musculoskeletal: No chest wall abnormality. No acute or significant osseous findings. Review of the MIP images confirms the above findings. IMPRESSION: No central, segmental, or subsegmental pulmonary embolism. Interlobular septal thickening and there are a spectrum of findings in the lungs which can be seen with acute viral atypical infection (as well as other non-infectious etiologies such as edema). Mild centrilobular emphysematous changes in both lung apices. Electronically Signed   By: Prudencio Pair M.D.   On: 12/29/2018 22:09   Dg Chest Portable 1 View  Result Date: 12/02/2018 CLINICAL DATA:  Shortness of breath and COVID-19 positivity EXAM:  PORTABLE CHEST 1 VIEW COMPARISON:  12/06/2018 FINDINGS: Cardiac shadow is stable. The lungs are well aerated bilaterally. Increasing interstitial markings are noted throughout the mid and lower lung fields bilaterally consistent with the patient's given clinical history of COVID-19 positivity. No focal confluent infiltrate is seen. No effusion is noted. No bony abnormality is seen. IMPRESSION: Increasing interstitial markings  consistent with the patient's given clinical history of COVID-19 positivity. Electronically Signed   By: Inez Catalina M.D.   On: 12/01/2018 20:31    Pending Labs Unresulted Labs (From admission, onward)    Start     Ordered   12/17/18 0500  Creatinine, serum  (enoxaparin (LOVENOX)    CrCl >/= 30 ml/min)  Weekly,   R    Comments: while on enoxaparin therapy    12/10/18 0133   12/10/18 0133  CBC with Differential/Platelet  Once,   STAT     12/10/18 0133   12/10/18 0132  CBC  (enoxaparin (LOVENOX)    CrCl >/= 30 ml/min)  Once,   STAT    Comments: Baseline for enoxaparin therapy IF NOT ALREADY DRAWN.  Notify MD if PLT < 100 K.    12/10/18 0133          Vitals/Pain Today's Vitals   12/10/18 0600 12/10/18 0615 12/10/18 0630 12/10/18 1100  BP: 99/66   110/69  Pulse: 86   72  Resp:    (!) 25  Temp:      TempSrc:      SpO2: 91% 93% 92% 92%  Weight:      Height:      PainSc:        Isolation Precautions Airborne and Contact precautions  Medications Medications  pravastatin (PRAVACHOL) tablet 40 mg (has no administration in time range)  levothyroxine (SYNTHROID) tablet 75 mcg (75 mcg Oral Given 12/10/18 0729)  pantoprazole (PROTONIX) EC tablet 40 mg (40 mg Oral Given 12/10/18 0903)  fluticasone (FLOVENT HFA) 110 MCG/ACT inhaler 1 puff (has no administration in time range)  dexamethasone (DECADRON) injection 6 mg (6 mg Intravenous Given 12/10/18 0902)  remdesivir 200 mg in sodium chloride 0.9 % 250 mL IVPB (0 mg Intravenous Stopped 12/10/18 0727)    Followed by  remdesivir 100 mg in sodium chloride 0.9 % 250 mL IVPB (has no administration in time range)  iohexol (OMNIPAQUE) 350 MG/ML injection 80 mL (80 mLs Intravenous Contrast Given 12/12/2018 2156)  oxyCODONE-acetaminophen (PERCOCET/ROXICET) 5-325 MG per tablet 1 tablet (1 tablet Oral Given 12/15/2018 2351)    Mobility walks Low fall risk   Focused Assessments    R Recommendations: See Admitting Provider Note  Report  given to: [facility] RN   Additional Notes:

## 2018-12-10 NOTE — ED Notes (Signed)
CALLED [facility] FOR PT TRANSPORT TO [facility]

## 2018-12-10 NOTE — ED Notes (Signed)
GVC/Tele   Breakfast ordered

## 2018-12-11 LAB — D-DIMER, QUANTITATIVE: D-Dimer, Quant: 1.82 ug/mL-FEU — ABNORMAL HIGH (ref 0.00–0.50)

## 2018-12-11 LAB — C-REACTIVE PROTEIN: CRP: 17 mg/dL — ABNORMAL HIGH (ref <1.0)

## 2018-12-11 LAB — BRAIN NATRIURETIC PEPTIDE: B Natriuretic Peptide: 53.2 pg/mL (ref 0.0–100.0)

## 2018-12-11 LAB — SAMPLE TO BLOOD BANK

## 2018-12-11 LAB — FERRITIN: Ferritin: 424 ng/mL — ABNORMAL HIGH (ref 11–307)

## 2018-12-11 LAB — TSH: TSH: 0.298 u[IU]/mL — ABNORMAL LOW (ref 0.350–4.500)

## 2018-12-11 LAB — PROCALCITONIN: Procalcitonin: <0.1 ng/mL

## 2018-12-11 LAB — ABO/RH: ABO/RH(D): A POS

## 2018-12-11 MED ORDER — HYDROCOD POLST-CPM POLST ER 10-8 MG/5ML PO SUER
5.0000 mL | Freq: Two times a day (BID) | ORAL | Status: DC
  Administered 2018-12-11 – 2018-12-14 (×7): 5 mL via ORAL
  Filled 2018-12-11 (×7): qty 5

## 2018-12-11 MED ORDER — ALBUTEROL SULFATE HFA 108 (90 BASE) MCG/ACT IN AERS
1.0000 | INHALATION_SPRAY | RESPIRATORY_TRACT | Status: DC
  Administered 2018-12-11 (×2): 2 via RESPIRATORY_TRACT
  Administered 2018-12-11 – 2018-12-12 (×3): 1 via RESPIRATORY_TRACT
  Filled 2018-12-11: qty 6.7

## 2018-12-11 MED ORDER — IPRATROPIUM BROMIDE HFA 17 MCG/ACT IN AERS
2.0000 | INHALATION_SPRAY | RESPIRATORY_TRACT | Status: DC
  Administered 2018-12-11 – 2018-12-12 (×5): 2 via RESPIRATORY_TRACT
  Filled 2018-12-11: qty 12.9

## 2018-12-11 MED ORDER — TOCILIZUMAB 400 MG/20ML IV SOLN
8.0000 mg/kg | Freq: Once | INTRAVENOUS | Status: AC
  Administered 2018-12-11: 580 mg via INTRAVENOUS
  Filled 2018-12-11: qty 20

## 2018-12-11 MED ORDER — SODIUM CHLORIDE 0.9% IV SOLUTION
Freq: Once | INTRAVENOUS | Status: AC
  Administered 2018-12-11: 12:00:00 via INTRAVENOUS

## 2018-12-11 NOTE — Plan of Care (Signed)
  Problem: Education: Goal: Knowledge of risk factors and measures for prevention of condition will improve 12/11/2018 1143 by Orvan Falconer, RN Outcome: Progressing 12/11/2018 1143 by Orvan Falconer, RN Outcome: Progressing   Problem: Coping: Goal: Psychosocial and spiritual needs will be supported 12/11/2018 1143 by Orvan Falconer, RN Outcome: Progressing 12/11/2018 1143 by Orvan Falconer, RN Outcome: Progressing   Problem: Respiratory: Goal: Will maintain a patent airway 12/11/2018 1143 by Orvan Falconer, RN Outcome: Progressing 12/11/2018 1143 by Orvan Falconer, RN Outcome: Progressing Goal: Complications related to the disease process, condition or treatment will be avoided or minimized 12/11/2018 1143 by Orvan Falconer, RN Outcome: Progressing 12/11/2018 1143 by Orvan Falconer, RN Outcome: Progressing

## 2018-12-11 NOTE — Progress Notes (Addendum)
PROGRESS NOTE    Kim Harrison  [id number] DOB: [birth year] DOA: 12/19/2018 PCP: Lou Miner, MD   Brief Narrative:  63 year old with sarcoidosis, hypothyroidism, HDL presented with 10 days of fevers and chills.  Initially went to urgent care and was tested negative.  Started on steroids and antibiotics but due to minimal improvement in symptoms and an episode of syncopal episode she was taken to [facility] where she was tested positive for COVID-19 pneumonia   Assessment & Plan:   Principal Problem:   Acute respiratory failure with hypoxia ([place]) Active Problems:   Pneumonia due to COVID-19 virus   Hypothyroidism   Sarcoidosis   Acute hypoxic respiratory failure secondary to COVID-19 pneumonia -Tested + November 6 -IV Decadron-D3, remdesivir-D2 -IV convalescent plasma ordered 11/11 -Check procalcitonin.  If no signs of infection, will add Actemra -Vitamin C and zinc -Trend inflammatory markers.  CRP quite elevated -Supplemental oxygen. -Bronchodilators.  I have extensively discussed  treatments with convalescent plasma (emergent use) and off label Actemra with the patient and the husband.  Explained them that at this time these are not officially approved treatment as they have not clearly shown benefits but also does not have major contraindications in this particular case.  Denies any current history of tuberculosis, underlying infection, chemotherapy for malignancy, abnormal LFTs.  They understand the risks and benefit and would like to proceed with administration of both convalescent plasma and Actemra.  Hypothyroidism -Synthroid  Hyperlipidemia -Pravachol  History of sarcoidosis -On steroids at home  DVT prophylaxis: Fondaparinux Code Status: Full Family Communication: Spoke with patient's husband Disposition Plan: To be determined   Subjective: Patient continues to become more hypoxic now requiring 13 L of high flow nasal cannula.  Has been  coughing.  Had extensive discussion with the patient and the husband.  Explained them the treatment course and also unofficial additional treatment with convalescent plasma and Actemra.  They have agreed to proceed with this.  All the questions have been answered.  Review of Systems Otherwise negative except as per HPI, including: General: Denies fever, chills, night sweats or unintended weight loss. Resp: Denies hemoptysis Cardiac: Denies chest pain, palpitations, orthopnea, paroxysmal nocturnal dyspnea. GI: Denies abdominal pain, nausea, vomiting, diarrhea or constipation GU: Denies dysuria, frequency, hesitancy or incontinence MS: Denies muscle aches, joint pain or swelling Neuro: Denies headache, neurologic deficits (focal weakness, numbness, tingling), abnormal gait Psych: Denies anxiety, depression, SI/HI/AVH Skin: Denies new rashes or lesions ID: Denies sick contacts, exotic exposures, travel  Objective: Vitals:   12/11/18 0655 12/11/18 0658 12/11/18 0700 12/11/18 0730  BP:   120/73 113/78  Pulse:   66 65  Resp:   (!) 26 19  Temp:    98 F (36.7 C)  TempSrc:    Oral  SpO2: (!) 89% 94% 94%   Weight:      Height:       No intake or output data in the 24 hours ending 12/11/18 0800 Filed Weights   12/14/2018 1930  Weight: 72.6 kg    Examination:  General exam: Appears calm and comfortable, 13 L high flow nasal cannula Respiratory system: Diffuse diminished breath sounds Cardiovascular system: S1 & S2 heard, RRR. No JVD, murmurs, rubs, gallops or clicks. No pedal edema. Gastrointestinal system: Abdomen is nondistended, soft and nontender. No organomegaly or masses felt. Normal bowel sounds heard. Central nervous system: Alert and oriented. No focal neurological deficits. Extremities: Symmetric 5 x 5 power. Skin: No rashes, lesions or ulcers Psychiatry: Judgement and insight appear  normal. Mood & affect appropriate.     Data Reviewed:   CBC: Recent Labs  Lab  12/11/2018 1944 12/10/18 0359  WBC 9.7 9.0  NEUTROABS  --  7.8*  HGB 14.7 13.4  HCT 44.0 40.6  MCV 91.7 92.3  PLT 233 [id number]   Basic Metabolic Panel: Recent Labs  Lab 12/03/2018 1944 12/10/18 0208  NA 137 134*  K 4.4 4.0  CL 105 104  CO2 22 21*  GLUCOSE 126* 100*  BUN 20 20  CREATININE 1.16* 0.96  CALCIUM 8.9 8.7*   GFR: Estimated Creatinine Clearance: 59.1 mL/min (by C-G formula based on SCr of 0.96 mg/dL). Liver Function Tests: Recent Labs  Lab 12/10/18 0208  AST 43*  ALT 51*  ALKPHOS 57  BILITOT 0.6  PROT 6.2*  ALBUMIN 2.6*   No results for input(s): LIPASE, AMYLASE in the last 168 hours. No results for input(s): AMMONIA in the last 168 hours. Coagulation Profile: No results for input(s): INR, PROTIME in the last 168 hours. Cardiac Enzymes: No results for input(s): CKTOTAL, CKMB, CKMBINDEX, TROPONINI in the last 168 hours. BNP (last 3 results) No results for input(s): PROBNP in the last 8760 hours. HbA1C: No results for input(s): HGBA1C in the last 72 hours. CBG: No results for input(s): GLUCAP in the last 168 hours. Lipid Profile: No results for input(s): CHOL, HDL, LDLCALC, TRIG, CHOLHDL, LDLDIRECT in the last 72 hours. Thyroid Function Tests: Recent Labs    12/11/18 0350  TSH 0.298*   Anemia Panel: Recent Labs    12/10/18 0208 12/11/18 0350  FERRITIN 272 424*   Sepsis Labs: No results for input(s): PROCALCITON, LATICACIDVEN in the last 168 hours.  No results found for this or any previous visit (from the past 240 hour(s)).       Radiology Studies: Ct Angio Chest Pe W And/or Wo Contrast  Result Date: 12/01/2018 CLINICAL DATA:  Shortness of breath, covid positive EXAM: CT ANGIOGRAPHY CHEST WITH CONTRAST TECHNIQUE: Multidetector CT imaging of the chest was performed using the standard protocol during bolus administration of intravenous contrast. Multiplanar CT image reconstructions and MIPs were obtained to evaluate the vascular anatomy.  CONTRAST:  37mL OMNIPAQUE IOHEXOL 350 MG/ML SOLN COMPARISON:  None. FINDINGS: Cardiovascular: There is a optimal opacification of the pulmonary arteries. There is no central,segmental, or subsegmental filling defects within the pulmonary arteries. The heart is normal in size. No pericardial effusion or thickening. No evidence right heart strain. There is normal three-vessel brachiocephalic anatomy without proximal stenosis. Minimal aortic arch calcifications are seen. Mediastinum/Nodes: Scattered pretracheal, subcarinal, and right hilar lymph nodes are noted the largest measuring 1.3 cm in the subcarinal region. Thyroid gland, trachea, and esophagus demonstrate no significant findings. Lungs/Pleura: Mild centrilobular emphysematous changes are seen at both lung apices. There is interlobular septal thickening with ground-glass opacities seen throughout both lungs, predominantly within the lower lobes. No pleural effusion is seen. No large airspace consolidation is seen. Upper Abdomen: No acute abnormalities present in the visualized portions of the upper abdomen. Musculoskeletal: No chest wall abnormality. No acute or significant osseous findings. Review of the MIP images confirms the above findings. IMPRESSION: No central, segmental, or subsegmental pulmonary embolism. Interlobular septal thickening and there are a spectrum of findings in the lungs which can be seen with acute viral atypical infection (as well as other non-infectious etiologies such as edema). Mild centrilobular emphysematous changes in both lung apices. Electronically Signed   By: Prudencio Pair M.D.   On: 12/06/2018 22:09   Dg  Chest Portable 1 View  Result Date: 12/24/2018 CLINICAL DATA:  Shortness of breath and COVID-19 positivity EXAM: PORTABLE CHEST 1 VIEW COMPARISON:  12/06/2018 FINDINGS: Cardiac shadow is stable. The lungs are well aerated bilaterally. Increasing interstitial markings are noted throughout the mid and lower lung fields  bilaterally consistent with the patient's given clinical history of COVID-19 positivity. No focal confluent infiltrate is seen. No effusion is noted. No bony abnormality is seen. IMPRESSION: Increasing interstitial markings consistent with the patient's given clinical history of COVID-19 positivity. Electronically Signed   By: Inez Catalina M.D.   On: 12/19/2018 20:31        Scheduled Meds: . dexamethasone (DECADRON) injection  6 mg Intravenous Daily  . fluticasone  1 puff Inhalation BID  . fondaparinux (ARIXTRA) injection  2.5 mg Subcutaneous Q0600  . levothyroxine  75 mcg Oral Daily  . pantoprazole  40 mg Oral Daily  . pravastatin  40 mg Oral Daily  . vitamin C  500 mg Oral Daily  . zinc sulfate  220 mg Oral Daily   Continuous Infusions: . remdesivir 100 mg in NS 250 mL       LOS: 1 day   Time spent= 45 mins    Tula Schryver Arsenio Loader, MD [facility]  If 7PM-7AM, please contact night-coverage  12/11/2018, 8:00 AM

## 2018-12-11 NOTE — Progress Notes (Addendum)
Pt is resting in the prone position.

## 2018-12-11 NOTE — Evaluation (Signed)
Physical Therapy Evaluation Patient Details Name: Kim Harrison MRN: [id number] DOB: 1955/10/16 Today's Date: 12/11/2018   History of Present Illness   63 y/o female pt w/ hx of sarcoidosis, cervical cancer, presented to hospital with c/o SOB, hypothyroidism, HLD, fever and chills on 11/01 was treated and dx home with family, 11/06 sx returned and she returned to hospital when she was dx with COVID. Pt admitted to [facility], [place] 11/10 with ARF w/ hypoxia, PNA sec to COVID 19, hypothyroidism and sarcoidosis.   Clinical Impression   Pt admitted with above diagnosis. PTA was living home with spouse and states was quite independent. She does have hx if sarcoidosis. Pt currently with functional limitations due to the deficits listed below (see PT Problem List). She is very apprehensive about any mobility this pm as everytime she attempts mobility her 02 sats drop into 70s even into 60s. She was on 14L/min via HFNC during assessment measured via [employer] pedi finger probe. She was able to get to [facility] with min-min guard assist this pm and did desat into 70s. Did not attempt ambulation in room as yet sec to desating and apprehension.Once pt returned to side lying in bed sats began to increase slowly. Pt will benefit from skilled PT to increase their independence and safety with mobility to allow discharge to the venue listed below.       Follow Up Recommendations Home health PT    Equipment Recommendations       Recommendations for Other Services Rehab consult;OT consult     Precautions / Restrictions Precautions Precautions: Fall Restrictions Weight Bearing Restrictions: No      Mobility  Bed Mobility Overal bed mobility: Needs Assistance Bed Mobility: Supine to Sit;Sit to Supine;Rolling Rolling: Supervision   Supine to sit: Min guard Sit to supine: Min guard   General bed mobility comments: takes increased time to complete, on 14L/min via HFNC desat to 70s with supine to  sit  Transfers Overall transfer level: Needs assistance Equipment used: 1 person hand held assist Transfers: Sit to/from [facility] Sit to Stand: Min guard Stand pivot transfers: Min guard       General transfer comment: able to transfer to [facility] with min guard and HHA, again desats to high 60s as per monitor.  Ambulation/Gait             General Gait Details: did not attempt ambulation past BSC, pt was quite apprehensive and sats were reading low with any mobility. Will continue to progress as pt tolerates.  Stairs            Wheelchair Mobility    Modified Rankin (Stroke Patients Only)       Balance Overall balance assessment: Needs assistance Sitting-balance support: Feet unsupported Sitting balance-Leahy Scale: Fair     Standing balance support: During functional activity Standing balance-Leahy Scale: Fair                               Pertinent Vitals/Pain Pain Assessment: Faces Faces Pain Scale: Hurts even more Pain Location: w/ mobility Pain Intervention(s): Limited activity within patient's tolerance    Home Living Family/patient expects to be discharged to:: Private residence Living Arrangements: Spouse/significant other Available Help at Discharge: Family Type of Home: House                Prior Function Level of Independence: Independent  Hand Dominance        Extremity/Trunk Assessment   Upper Extremity Assessment Upper Extremity Assessment: Defer to OT evaluation    Lower Extremity Assessment Lower Extremity Assessment: Generalized weakness    Cervical / Trunk Assessment Cervical / Trunk Assessment: Normal  Communication   Communication: No difficulties  Cognition Arousal/Alertness: Lethargic Behavior During Therapy: Anxious Overall Cognitive Status: Within Functional Limits for tasks assessed                                 General Comments: Pt is  slightly emotional at situation, also recieved call from spouse and became more emotional      General Comments General comments (skin integrity, edema, etc.): Arrived in room and pt is on 14L/min via HFNC, she is quite apprehensive about any mobility as she states that she has been desating. Initially pt is reluctant to attempt getting oob but spouse called and encouarged to attempt getting oob. Pt requested use of BSC and was able to get to [facility] with min-min guard and HHA, and return to bed. Pt reported need to use BSC again but was quite apprehensive and was agreeable to using bed pan. During mobility sats decreased to low 70s and at one point read 68% w/ cues pt able to complete pursed lip breathing and slightly increase sats. Once back in bed and sidelying sats seem to increase again slowly.    Exercises     Assessment/Plan    PT Assessment Patient needs continued PT services  PT Problem List Decreased strength;Decreased activity tolerance;Decreased mobility;Decreased cognition;Decreased safety awareness;Decreased range of motion;Decreased balance;Decreased coordination       PT Treatment Interventions Gait training;Functional mobility training;Therapeutic exercise;Therapeutic activities;Balance training;Neuromuscular re-education;Patient/family education    PT Goals (Current goals can be found in the Care Plan section)  Acute Rehab PT Goals PT Goal Formulation: With patient Time For Goal Achievement: 12/25/18 Potential to Achieve Goals: Fair    Frequency Min 3X/week   Barriers to discharge        Co-evaluation               AM-PAC PT "6 Clicks" Mobility  Outcome Measure Help needed turning from your back to your side while in a flat bed without using bedrails?: A Little Help needed moving from lying on your back to sitting on the side of a flat bed without using bedrails?: A Little Help needed moving to and from a bed to a chair (including a wheelchair)?: A Little Help  needed standing up from a chair using your arms (e.g., wheelchair or bedside chair)?: A Little Help needed to walk in hospital room?: A Lot Help needed climbing 3-5 steps with a railing? : A Lot 6 Click Score: 16    End of Session Equipment Utilized During Treatment: Oxygen Activity Tolerance: Treatment limited secondary to medical complications (Comment);Patient limited by lethargy;Patient limited by fatigue Patient left: in bed;with call bell/phone within reach Nurse Communication: Mobility status PT Visit Diagnosis: Other abnormalities of gait and mobility (R26.89);Muscle weakness (generalized) (M62.81)    Time: [id number] PT Time Calculation (min) (ACUTE ONLY): 37 min   Charges:   PT Evaluation $PT Eval Moderate Complexity: 1 Mod PT Treatments $Therapeutic Exercise: 23-37 mins        Horald Chestnut, PT    Delford Field 12/11/2018, 3:37 PM

## 2018-12-12 LAB — COMPREHENSIVE METABOLIC PANEL
ALT: 38 U/L (ref 0–44)
AST: 29 U/L (ref 15–41)
Albumin: 2.6 g/dL — ABNORMAL LOW (ref 3.5–5.0)
Alkaline Phosphatase: 58 U/L (ref 38–126)
Anion gap: 10 (ref 5–15)
BUN: 28 mg/dL — ABNORMAL HIGH (ref 8–23)
CO2: 23 mmol/L (ref 22–32)
Calcium: 8.1 mg/dL — ABNORMAL LOW (ref 8.9–10.3)
Chloride: 99 mmol/L (ref 98–111)
Creatinine, Ser: 0.9 mg/dL (ref 0.44–1.00)
GFR calc Af Amer: >60 mL/min (ref >60)
GFR calc non Af Amer: >60 mL/min (ref >60)
Glucose, Bld: 147 mg/dL — ABNORMAL HIGH (ref 70–99)
Potassium: 4.1 mmol/L (ref 3.5–5.1)
Sodium: 132 mmol/L — ABNORMAL LOW (ref 135–145)
Total Bilirubin: 0.6 mg/dL (ref 0.3–1.2)
Total Protein: 6.1 g/dL — ABNORMAL LOW (ref 6.5–8.1)

## 2018-12-12 LAB — CBC
HCT: 39.9 % (ref 36.0–46.0)
Hemoglobin: 13 g/dL (ref 12.0–15.0)
MCH: 30.1 pg (ref 26.0–34.0)
MCHC: 32.6 g/dL (ref 30.0–36.0)
MCV: 92.4 fL (ref 80.0–100.0)
Platelets: 215 10*3/uL (ref 150–400)
RBC: 4.32 MIL/uL (ref 3.87–5.11)
RDW: 13.1 % (ref 11.5–15.5)
WBC: 5 10*3/uL (ref 4.0–10.5)
nRBC: 0 % (ref 0.0–0.2)

## 2018-12-12 LAB — BPAM FFP
Blood Product Expiration Date: 202011122039
ISSUE DATE / TIME: 202011112042
Unit Type and Rh: 6200

## 2018-12-12 LAB — PREPARE FRESH FROZEN PLASMA

## 2018-12-12 LAB — MAGNESIUM: Magnesium: 2.1 mg/dL (ref 1.7–2.4)

## 2018-12-12 LAB — LACTATE DEHYDROGENASE: LDH: 337 U/L — ABNORMAL HIGH (ref 98–192)

## 2018-12-12 LAB — D-DIMER, QUANTITATIVE: D-Dimer, Quant: 1.2 ug/mL-FEU — ABNORMAL HIGH (ref 0.00–0.50)

## 2018-12-12 LAB — C-REACTIVE PROTEIN: CRP: 8.8 mg/dL — ABNORMAL HIGH (ref <1.0)

## 2018-12-12 MED ORDER — BENZONATATE 100 MG PO CAPS
200.0000 mg | ORAL_CAPSULE | Freq: Three times a day (TID) | ORAL | Status: DC | PRN
  Administered 2018-12-12 – 2018-12-15 (×3): 200 mg via ORAL
  Filled 2018-12-12 (×3): qty 2

## 2018-12-12 MED ORDER — DEXAMETHASONE SODIUM PHOSPHATE 10 MG/ML IJ SOLN
6.0000 mg | Freq: Two times a day (BID) | INTRAMUSCULAR | Status: DC
  Administered 2018-12-12: 6 mg via INTRAVENOUS
  Filled 2018-12-12: qty 1

## 2018-12-12 MED ORDER — IPRATROPIUM-ALBUTEROL 20-100 MCG/ACT IN AERS
1.0000 | INHALATION_SPRAY | Freq: Four times a day (QID) | RESPIRATORY_TRACT | Status: DC
  Administered 2018-12-12 – 2018-12-14 (×7): 1 via RESPIRATORY_TRACT
  Filled 2018-12-12: qty 4

## 2018-12-12 MED ORDER — ORAL CARE MOUTH RINSE
15.0000 mL | Freq: Two times a day (BID) | OROMUCOSAL | Status: DC
  Administered 2018-12-13 – 2018-12-30 (×31): 15 mL via OROMUCOSAL

## 2018-12-12 NOTE — Progress Notes (Signed)
OT Cancellation Note  Patient Details Name: Kim Harrison MRN: [id number] DOB: 1955-06-12   Cancelled Treatment:    Reason Eval/Treat Not Completed: Medical issues which prohibited therapy; spoke with RN, reports pt having a difficult time breathing this AM, noted pt on 15L HFNC. Request to hold at this time. Will follow up for OT eval as able.  Lou Cal, OT [facility] Pager [phone] Office [phone]   Raymondo Band 12/12/2018, 11:17 AM

## 2018-12-12 NOTE — Progress Notes (Signed)
PROGRESS NOTE    Kim Harrison  [id number] DOB: Mar 18, 1955 DOA: 12/28/2018 PCP: Lou Miner, MD   Brief Narrative:  63 year old with sarcoidosis, hypothyroidism, HDL presented with 10 days of fevers and chills.  Initially went to urgent care and was tested negative.  Started on steroids and antibiotics but due to minimal improvement in symptoms and an episode of syncopal episode she was taken to [facility] where she was tested positive for COVID-19 pneumonia   Assessment & Plan:   Principal Problem:   Acute respiratory failure with hypoxia ([place]) Active Problems:   Pneumonia due to COVID-19 virus   Hypothyroidism   Sarcoidosis   Acute hypoxic respiratory failure secondary to COVID-19 pneumonia -Tested + November 6. 12L HF -IV Decadron-D3, remdesivir-D2 -IV convalescent plasma ordered 11/11 -s/p Actemra & plasma 11/11 -Vitamin C and zinc -Trend inflammatory markers.  CRP quite elevated -Supplemental oxygen. If continues to worsen - will obtain CT chest.  -Bronchodilators.  Hypothyroidism -Synthroid  Hyperlipidemia -Pravachol  History of sarcoidosis -On steroids at home  DVT prophylaxis: Fondaparinux Code Status: Full Family Communication: Husband over the phone during my eval  Disposition Plan: Not safe for discharge due to significant amount of hypoxia.    Subjective: Felt better yesterday evening. But still needing 10-15LHF.   Review of Systems Otherwise negative except as per HPI, including: General = no fevers, chills, dizziness, malaise, fatigue HEENT/EYES = negative for pain, redness, loss of vision, double vision, blurred vision, loss of hearing, sore throat, hoarseness, dysphagia Cardiovascular= negative for chest pain, palpitation, murmurs, lower extremity swelling Respiratory/lungs= negative for hemoptysis Gastrointestinal= negative for nausea, vomiting,, abdominal pain, melena, hematemesis Genitourinary= negative for Dysuria,  Hematuria, Change in Urinary Frequency MSK = Negative for arthralgia, myalgias, Back Pain, Joint swelling  Neurology= Negative for headache, seizures, numbness, tingling  Psychiatry= Negative for anxiety, depression, suicidal and homocidal ideation Allergy/Immunology= Medication/Food allergy as listed  Skin= Negative for Rash, lesions, ulcers, itching    Objective: Vitals:   12/12/18 0420 12/12/18 0606 12/12/18 0700 12/12/18 0849  BP:    (!) 83/50  Pulse: (!) 53 (!) 53 (!) 59 78  Resp: (!) 23 16 19  (!) 22  Temp:    98.1 F (36.7 C)  TempSrc:    Oral  SpO2: 92% 99% 93% 90%  Weight:      Height:        Intake/Output Summary (Last 24 hours) at 12/12/2018 1020 Last data filed at 12/12/2018 [id number] Gross per 24 hour  Intake 958 ml  Output -  Net 958 ml   Filed Weights   12/26/2018 1930  Weight: 72.6 kg    Examination:  Constitutional: NAD but still on 15LHF Respiratory: b/l Rhonchi.  Cardiovascular: Normal sinus rhythm, no rubs Abdomen: Nontender nondistended good bowel sounds Musculoskeletal: No edema noted Skin: No rashes seen Neurologic: CN 2-12 grossly intact.  And nonfocal Psychiatric: Normal judgment and insight. Alert and oriented x 3. Normal mood.     Data Reviewed:   CBC: Recent Labs  Lab 12/16/2018 1944 12/10/18 0359 12/12/18 0110  WBC 9.7 9.0 5.0  NEUTROABS  --  7.8*  --   HGB 14.7 13.4 13.0  HCT 44.0 40.6 39.9  MCV 91.7 92.3 92.4  PLT 233 207 [id number]   Basic Metabolic Panel: Recent Labs  Lab 12/30/2018 1944 12/10/18 0208 12/12/18 0110  NA 137 134* 132*  K 4.4 4.0 4.1  CL 105 104 99  CO2 22 21* 23  GLUCOSE 126* 100* 147*  BUN 20 20  28*  CREATININE 1.16* 0.96 0.90  CALCIUM 8.9 8.7* 8.1*  MG  --   --  2.1   GFR: Estimated Creatinine Clearance: 63 mL/min (by C-G formula based on SCr of 0.9 mg/dL). Liver Function Tests: Recent Labs  Lab 12/10/18 0208 12/12/18 0110  AST 43* 29  ALT 51* 38  ALKPHOS 57 58  BILITOT 0.6 0.6  PROT 6.2* 6.1*   ALBUMIN 2.6* 2.6*   No results for input(s): LIPASE, AMYLASE in the last 168 hours. No results for input(s): AMMONIA in the last 168 hours. Coagulation Profile: No results for input(s): INR, PROTIME in the last 168 hours. Cardiac Enzymes: No results for input(s): CKTOTAL, CKMB, CKMBINDEX, TROPONINI in the last 168 hours. BNP (last 3 results) No results for input(s): PROBNP in the last 8760 hours. HbA1C: No results for input(s): HGBA1C in the last 72 hours. CBG: No results for input(s): GLUCAP in the last 168 hours. Lipid Profile: No results for input(s): CHOL, HDL, LDLCALC, TRIG, CHOLHDL, LDLDIRECT in the last 72 hours. Thyroid Function Tests: Recent Labs    12/11/18 0350  TSH 0.298*   Anemia Panel: Recent Labs    12/10/18 0208 12/11/18 0350  FERRITIN 272 424*   Sepsis Labs: Recent Labs  Lab 12/11/18 0350  PROCALCITON <0.10    No results found for this or any previous visit (from the past 240 hour(s)).       Radiology Studies: No results found.      Scheduled Meds: . chlorpheniramine-HYDROcodone  5 mL Oral Q12H  . dexamethasone (DECADRON) injection  6 mg Intravenous Daily  . fluticasone  1 puff Inhalation BID  . fondaparinux (ARIXTRA) injection  2.5 mg Subcutaneous Q0600  . Ipratropium-Albuterol  1 puff Inhalation Q6H  . levothyroxine  75 mcg Oral Daily  . mouth rinse  15 mL Mouth Rinse BID  . pantoprazole  40 mg Oral Daily  . pravastatin  40 mg Oral Daily  . vitamin C  500 mg Oral Daily  . zinc sulfate  220 mg Oral Daily   Continuous Infusions: . remdesivir 100 mg in NS 250 mL Stopped (12/11/18 1215)     LOS: 2 days   Time spent= 35 mins    Ankit Arsenio Loader, MD [facility]  If 7PM-7AM, please contact night-coverage  12/12/2018, 10:20 AM

## 2018-12-13 ENCOUNTER — Inpatient Hospital Stay (HOSPITAL_COMMUNITY): Payer: BC Managed Care – PPO

## 2018-12-13 LAB — COMPREHENSIVE METABOLIC PANEL
ALT: 58 U/L — ABNORMAL HIGH (ref 0–44)
AST: 43 U/L — ABNORMAL HIGH (ref 15–41)
Albumin: 2.4 g/dL — ABNORMAL LOW (ref 3.5–5.0)
Alkaline Phosphatase: 55 U/L (ref 38–126)
Anion gap: 9 (ref 5–15)
BUN: 27 mg/dL — ABNORMAL HIGH (ref 8–23)
CO2: 24 mmol/L (ref 22–32)
Calcium: 8.3 mg/dL — ABNORMAL LOW (ref 8.9–10.3)
Chloride: 106 mmol/L (ref 98–111)
Creatinine, Ser: 0.8 mg/dL (ref 0.44–1.00)
GFR calc Af Amer: >60 mL/min (ref >60)
GFR calc non Af Amer: >60 mL/min (ref >60)
Glucose, Bld: 155 mg/dL — ABNORMAL HIGH (ref 70–99)
Potassium: 4.3 mmol/L (ref 3.5–5.1)
Sodium: 139 mmol/L (ref 135–145)
Total Bilirubin: 0.5 mg/dL (ref 0.3–1.2)
Total Protein: 5.6 g/dL — ABNORMAL LOW (ref 6.5–8.1)

## 2018-12-13 LAB — CBC
HCT: 40 % (ref 36.0–46.0)
Hemoglobin: 13.2 g/dL (ref 12.0–15.0)
MCH: 30.3 pg (ref 26.0–34.0)
MCHC: 33 g/dL (ref 30.0–36.0)
MCV: 91.7 fL (ref 80.0–100.0)
Platelets: 238 10*3/uL (ref 150–400)
RBC: 4.36 MIL/uL (ref 3.87–5.11)
RDW: 12.9 % (ref 11.5–15.5)
WBC: 7.6 10*3/uL (ref 4.0–10.5)
nRBC: 0 % (ref 0.0–0.2)

## 2018-12-13 LAB — FERRITIN: Ferritin: 312 ng/mL — ABNORMAL HIGH (ref 11–307)

## 2018-12-13 LAB — LACTATE DEHYDROGENASE: LDH: 338 U/L — ABNORMAL HIGH (ref 98–192)

## 2018-12-13 LAB — MAGNESIUM: Magnesium: 2.4 mg/dL (ref 1.7–2.4)

## 2018-12-13 LAB — C-REACTIVE PROTEIN: CRP: 4.1 mg/dL — ABNORMAL HIGH (ref <1.0)

## 2018-12-13 LAB — D-DIMER, QUANTITATIVE: D-Dimer, Quant: 1.07 ug/mL-FEU — ABNORMAL HIGH (ref 0.00–0.50)

## 2018-12-13 MED ORDER — TOCILIZUMAB 400 MG/20ML IV SOLN
8.0000 mg/kg | Freq: Once | INTRAVENOUS | Status: AC
  Administered 2018-12-13: 580 mg via INTRAVENOUS
  Filled 2018-12-13: qty 29

## 2018-12-13 MED ORDER — DEXAMETHASONE SODIUM PHOSPHATE 10 MG/ML IJ SOLN
6.0000 mg | Freq: Three times a day (TID) | INTRAMUSCULAR | Status: DC
  Administered 2018-12-13 – 2018-12-14 (×3): 6 mg via INTRAVENOUS
  Filled 2018-12-13 (×3): qty 1

## 2018-12-13 MED ORDER — FUROSEMIDE 10 MG/ML IJ SOLN
40.0000 mg | Freq: Once | INTRAMUSCULAR | Status: AC
  Administered 2018-12-13: 40 mg via INTRAVENOUS
  Filled 2018-12-13: qty 4

## 2018-12-13 MED ORDER — TOCILIZUMAB 400 MG/20ML IV SOLN: 4.0000 mg/kg | Freq: Once | INTRAVENOUS | Status: DC

## 2018-12-13 NOTE — Progress Notes (Signed)
PROGRESS NOTE    Kim Harrison  [id number] DOB: [birth year] DOA: 12/12/2018 PCP: Lou Miner, MD   Brief Narrative:  63 year old with sarcoidosis, hypothyroidism, HDL presented with 10 days of fevers and chills.  Initially went to urgent care and was tested negative.  Started on steroids and antibiotics but due to minimal improvement in symptoms and an episode of syncopal episode she was taken to [facility] where she was tested positive for COVID-19 pneumonia   Assessment & Plan:   Principal Problem:   Acute respiratory failure with hypoxia ([place]) Active Problems:   Pneumonia due to COVID-19 virus   Hypothyroidism   Sarcoidosis   Acute hypoxic respiratory failure secondary to COVID-19 pneumonia; somewhat worsening.  -Tested + November 6. Still on 15L HF -IV Decadron-D3- increased to q8hrs, remdesivir-D3 -IV convalescent plasma ordered 11/11 -s/p Actemra & plasma 11/11. Repeat dose of Actemra today -Vitamin C and zinc -Trend Inflammatory markers. -Supplemental oxygen. CXR shows worsening of infiltrates. If worsens any further she will need CTA chest to review her parenchyma and rule out PE -Bronchodilators. -1 dose 40 mg IV Lasix  Hypothyroidism -Synthroid  Hyperlipidemia -Pravachol  History of sarcoidosis -On steroids at home  DVT prophylaxis: Fondaparinux Code Status: Full Family Communication: Spoke with Shanon Brow, her husband Disposition Plan: Not safe for discharge due to significant amount of hypoxia.   Subjective: Still remains on 15 L nasal cannula.  Off-and-on gets hypoxic but symptomatically this morning denies any other complaints.  Review of Systems Otherwise negative except as per HPI, including: General = no fevers, chills, dizziness, malaise, fatigue HEENT/EYES = negative for pain, redness, loss of vision, double vision, blurred vision, loss of hearing, sore throat, hoarseness, dysphagia Cardiovascular= negative for chest pain,  palpitation, murmurs, lower extremity swelling Respiratory/lungs= negative for  hemoptysis, wheezing, mucus production Gastrointestinal= negative for nausea, vomiting,, abdominal pain, melena, hematemesis Genitourinary= negative for Dysuria, Hematuria, Change in Urinary Frequency MSK = Negative for arthralgia, myalgias, Back Pain, Joint swelling  Neurology= Negative for headache, seizures, numbness, tingling  Psychiatry= Negative for anxiety, depression, suicidal and homocidal ideation Allergy/Immunology= Medication/Food allergy as listed  Skin= Negative for Rash, lesions, ulcers, itching   Objective: Vitals:   12/12/18 1740 12/12/18 2000 12/13/18 0414 12/13/18 0800  BP: 120/71  113/70 133/61  Pulse: 66 70 61 61  Resp: (!) 26 (!) 21 (!) 22 17  Temp: 98.1 F (36.7 C) 98.6 F (37 C) (!) 97.5 F (36.4 C) 98.5 F (36.9 C)  TempSrc: Oral Oral Oral Oral  SpO2: 92% 90% (!) 83% 94%  Weight:      Height:        Intake/Output Summary (Last 24 hours) at 12/13/2018 [id number] Last data filed at 12/12/2018 2300 Gross per 24 hour  Intake 660 ml  Output 400 ml  Net 260 ml   Filed Weights   12/11/2018 1930  Weight: 72.6 kg    Examination:  Constitutional: Still on 15 L nasal cannula Respiratory: Bilateral rhonchi Cardiovascular: Normal sinus rhythm, no rubs Abdomen: Nontender nondistended good bowel sounds Musculoskeletal: No edema noted Skin: No rashes seen Neurologic: CN 2-12 grossly intact.  And nonfocal Psychiatric: Normal judgment and insight. Alert and oriented x 3. Normal mood.     Data Reviewed:   CBC: Recent Labs  Lab 12/26/2018 1944 12/10/18 0359 12/12/18 0110 12/13/18 0049  WBC 9.7 9.0 5.0 7.6  NEUTROABS  --  7.8*  --   --   HGB 14.7 13.4 13.0 13.2  HCT 44.0 40.6 39.9 40.0  MCV 91.7 92.3 92.4 91.7  PLT 233 207 215 [id number]   Basic Metabolic Panel: Recent Labs  Lab 12/20/2018 1944 12/10/18 0208 12/12/18 0110 12/13/18 0049  NA 137 134* 132* 139  K 4.4 4.0 4.1 4.3   CL 105 104 99 106  CO2 22 21* 23 24  GLUCOSE 126* 100* 147* 155*  BUN 20 20 28* 27*  CREATININE 1.16* 0.96 0.90 0.80  CALCIUM 8.9 8.7* 8.1* 8.3*  MG  --   --  2.1 2.4   GFR: Estimated Creatinine Clearance: 70.9 mL/min (by C-G formula based on SCr of 0.8 mg/dL). Liver Function Tests: Recent Labs  Lab 12/10/18 0208 12/12/18 0110 12/13/18 0049  AST 43* 29 43*  ALT 51* 38 58*  ALKPHOS 57 58 55  BILITOT 0.6 0.6 0.5  PROT 6.2* 6.1* 5.6*  ALBUMIN 2.6* 2.6* 2.4*   No results for input(s): LIPASE, AMYLASE in the last 168 hours. No results for input(s): AMMONIA in the last 168 hours. Coagulation Profile: No results for input(s): INR, PROTIME in the last 168 hours. Cardiac Enzymes: No results for input(s): CKTOTAL, CKMB, CKMBINDEX, TROPONINI in the last 168 hours. BNP (last 3 results) No results for input(s): PROBNP in the last 8760 hours. HbA1C: No results for input(s): HGBA1C in the last 72 hours. CBG: No results for input(s): GLUCAP in the last 168 hours. Lipid Profile: No results for input(s): CHOL, HDL, LDLCALC, TRIG, CHOLHDL, LDLDIRECT in the last 72 hours. Thyroid Function Tests: Recent Labs    12/11/18 0350  TSH 0.298*   Anemia Panel: Recent Labs    12/11/18 0350 12/13/18 0049  FERRITIN 424* 312*   Sepsis Labs: Recent Labs  Lab 12/11/18 0350  PROCALCITON <0.10    No results found for this or any previous visit (from the past 240 hour(s)).       Radiology Studies: Dg Chest Port 1 View  Result Date: 12/13/2018 CLINICAL DATA:  Dyspnea EXAM: PORTABLE CHEST 1 VIEW COMPARISON:  Portable exam 0556 hours compared to 12/29/2018 FINDINGS: Stable heart size mediastinal contours. Diffuse interstitial infiltrates bilaterally, slightly increased, consistent with pneumonia and history of COVID-19. No pleural effusion or pneumothorax. Bones unremarkable. IMPRESSION: Increased BILATERAL pulmonary infiltrates consistent with pneumonia. Electronically Signed   By: Lavonia Dana M.D.   On: 12/13/2018 07:58        Scheduled Meds: . chlorpheniramine-HYDROcodone  5 mL Oral Q12H  . dexamethasone (DECADRON) injection  6 mg Intravenous Q8H  . fluticasone  1 puff Inhalation BID  . fondaparinux (ARIXTRA) injection  2.5 mg Subcutaneous Q0600  . furosemide  40 mg Intravenous Once  . Ipratropium-Albuterol  1 puff Inhalation Q6H  . levothyroxine  75 mcg Oral Daily  . mouth rinse  15 mL Mouth Rinse BID  . pantoprazole  40 mg Oral Daily  . pravastatin  40 mg Oral Daily  . vitamin C  500 mg Oral Daily  . zinc sulfate  220 mg Oral Daily   Continuous Infusions: . remdesivir 100 mg in NS 250 mL 100 mg (12/13/18 0931)  . tocilizumab (ACTEMRA) IV 580 mg (12/13/18 0931)     LOS: 3 days   Time spent= 35 mins     Arsenio Loader, MD [facility]  If 7PM-7AM, please contact night-coverage  12/13/2018, 9:52 AM

## 2018-12-13 NOTE — Progress Notes (Signed)
Paged Dr. Sheran Luz re: patient on 15L HFNC and intermittent uses NRB just for movement in bed. Patient will desat to 70's without NRB while moving and takes >22mins to recover. Waiting call back. Will continue to monitor patient.

## 2018-12-13 NOTE — Evaluation (Addendum)
Occupational Therapy Evaluation Patient Details Name: Kim Harrison MRN: [id number] DOB: [birth year] Today's Date: 12/13/2018    History of Present Illness 63 y/o female pt w/ hx of sarcoidosis, cervical cancer, presented to hospital with c/o SOB, hypothyroidism, HLD, fever and chills on 11/01 was treated and dx home with family, 11/06 sx returned and she returned to hospital when she was dx with COVID. Pt admitted to [facility] 11/10 with ARF w/ hypoxia, PNA sec to COVID 19, hypothyroidism and sarcoidosis.   Clinical Impression   This 63 y/o female presents with the above. PTA pt reports independence with ADL, iADL and functional mobility. Pt significantly limited due to impaired respiratory status, weakness, decreased activity tolerance. Pt requesting use of BSC during this session, requiring minA for functional transfers to/from EOB. Utilized 15L HFNC and NRB with transition, O2 sats maintaining high 80s with initial transfer however post ADL and return to bed SpO2 decreasing to 68% (lowest noted), within approx 1 min O2 sats increasing to 82%, and within approx 23min returning to 88%. Cued for deep breathing techniques throughout. BP 113/81. Pt currently requiring modA for LB and toileting ADL. Am hopeful pt will progress well once respiratory status improves. She will benefit from continued acute OT services and recommend post acute follow up therapy services (pending progress) after discharge to progress pt towards her PLOF. Will follow.     Follow Up Recommendations  Home health OT;CIR;Supervision/Assistance - 24 hour(pending progress)    Equipment Recommendations  3 in 1 bedside commode           Precautions / Restrictions Precautions Precautions: Fall Restrictions Weight Bearing Restrictions: No      Mobility Bed Mobility Overal bed mobility: Needs Assistance Bed Mobility: Supine to Sit;Sit to Supine;Rolling Rolling: Supervision   Supine to sit: Min guard Sit to supine: Mod  assist   General bed mobility comments: overall supervision-minguard for lines and safety, assist for LEs back onto bed due to fatigue  Transfers Overall transfer level: Needs assistance Equipment used: 1 person hand held assist Transfers: Sit to/from Stand;Stand Pivot Transfers Sit to Stand: Min assist Stand pivot transfers: Min assist       General transfer comment: steadying assist throughout, cues for pacing, deep breathing techniques    Balance Overall balance assessment: Needs assistance Sitting-balance support: Feet unsupported Sitting balance-Leahy Scale: Fair     Standing balance support: During functional activity Standing balance-Leahy Scale: Poor                             ADL either performed or assessed with clinical judgement   ADL Overall ADL's : Needs assistance/impaired Eating/Feeding: Set up;Sitting   Grooming: Brushing hair;Minimal assistance;Bed level   Upper Body Bathing: Minimal assistance;Bed level;Sitting   Lower Body Bathing: Maximal assistance;Bed level;Sitting/lateral leans   Upper Body Dressing : Moderate assistance;Minimal assistance;Bed level   Lower Body Dressing: Moderate assistance;Bed level Lower Body Dressing Details (indicate cue type and reason): assist to thread underwear over LEs, pt able to bridge at bed level and advance over hips  Toilet Transfer: Minimal assistance;Stand-pivot;BSC   Toileting- Clothing Manipulation and Hygiene: Moderate assistance;Sit to/from stand;Bed level Toileting - Clothing Manipulation Details (indicate cue type and reason): assist for pericare and gown management     Functional mobility during ADLs: Minimal assistance(stand pivot) General ADL Comments: pt significantly limited due to impaired respiratory status and weakness, assisted with transfer to BSC/toileting ADL, pt on NRB and 15L HFNC with  activity and still requiring increased time to recover sats, pt having to urinate again end of  session - utilized bed pan for additional toileting as pt still revovering from earlier mobility . O2 sats sustaining mid 80s and above with bed level activity while still on 15L HFNC and NRB.      Vision         Perception     Praxis      Pertinent Vitals/Pain Pain Assessment: Faces Faces Pain Scale: Hurts little more Pain Location: generalized discomfort Pain Descriptors / Indicators: Discomfort;Grimacing Pain Intervention(s): Monitored during session;Limited activity within patient's tolerance;Repositioned     Hand Dominance     Extremity/Trunk Assessment Upper Extremity Assessment Upper Extremity Assessment: Generalized weakness   Lower Extremity Assessment Lower Extremity Assessment: Defer to PT evaluation   Cervical / Trunk Assessment Cervical / Trunk Assessment: Normal   Communication Communication Communication: No difficulties   Cognition Arousal/Alertness: Awake/alert Behavior During Therapy: WFL for tasks assessed/performed;Anxious Overall Cognitive Status: Within Functional Limits for tasks assessed                                 General Comments: requires cues for relaxation strategies   General Comments       Exercises     Shoulder Instructions      Home Living Family/patient expects to be discharged to:: Private residence Living Arrangements: Spouse/significant other Available Help at Discharge: Family Type of Home: House             Bathroom Shower/Tub: Walk-in Corporate treasurer Toilet: Standard     Home Equipment: None          Prior Functioning/Environment Level of Independence: Independent                 OT Problem List: Decreased strength;Decreased range of motion;Decreased activity tolerance;Decreased knowledge of use of DME or AE;Cardiopulmonary status limiting activity;Impaired balance (sitting and/or standing)      OT Treatment/Interventions: Self-care/ADL training;Therapeutic exercise;Energy  conservation;DME and/or AE instruction;Therapeutic activities;Patient/family education;Balance training    OT Goals(Current goals can be found in the care plan section) Acute Rehab OT Goals Patient Stated Goal: regain her independence, return home to her husband and dogs OT Goal Formulation: With patient Time For Goal Achievement: 12/27/18 Potential to Achieve Goals: Good  OT Frequency: Min 3X/week   Barriers to D/C:            Co-evaluation              AM-PAC OT "6 Clicks" Daily Activity     Outcome Measure Help from another person eating meals?: A Little Help from another person taking care of personal grooming?: A Little Help from another person toileting, which includes using toliet, bedpan, or urinal?: A Lot Help from another person bathing (including washing, rinsing, drying)?: A Lot Help from another person to put on and taking off regular upper body clothing?: A Little Help from another person to put on and taking off regular lower body clothing?: A Lot 6 Click Score: 15   End of Session Equipment Utilized During Treatment: Oxygen Nurse Communication: Mobility status  Activity Tolerance: Patient limited by fatigue;Patient tolerated treatment well Patient left: in bed;with call bell/phone within reach  OT Visit Diagnosis: Muscle weakness (generalized) (M62.81);Unsteadiness on feet (R26.81);Other (comment)(decreased activity tolerance)                Time: [id number] OT Time Calculation (  min): 57 min Charges:  OT General Charges $OT Visit: 1 Visit OT Evaluation $OT Eval High Complexity: 1 High OT Treatments $Self Care/Home Management : 38-52 mins  Lou Cal, OT [facility] Services Pager [phone] Office [phone]   Raymondo Band 12/13/2018, 2:51 PM

## 2018-12-13 NOTE — Progress Notes (Signed)
Received call back from Dr. Alcario Drought. Updates given re: patient increase in O2 requirements. The need to use NRB in addition to HFNC 15L. Will page if patient not able to maintain O2 sat. No new orders received. Will continue to monitor patient.

## 2018-12-13 NOTE — Progress Notes (Signed)
Spoke with patient's husband while in the room with the patient, updated him on patient's condition and answered any questions he had.

## 2018-12-14 ENCOUNTER — Inpatient Hospital Stay (HOSPITAL_COMMUNITY): Payer: BC Managed Care – PPO

## 2018-12-14 DIAGNOSIS — J1289 Other viral pneumonia: Secondary | ICD-10-CM

## 2018-12-14 DIAGNOSIS — U071 COVID-19: Secondary | ICD-10-CM | POA: Diagnosis not present

## 2018-12-14 DIAGNOSIS — J9601 Acute respiratory failure with hypoxia: Secondary | ICD-10-CM | POA: Diagnosis not present

## 2018-12-14 LAB — COMPREHENSIVE METABOLIC PANEL
ALT: 123 U/L — ABNORMAL HIGH (ref 0–44)
AST: 62 U/L — ABNORMAL HIGH (ref 15–41)
Albumin: 2.8 g/dL — ABNORMAL LOW (ref 3.5–5.0)
Alkaline Phosphatase: 61 U/L (ref 38–126)
Anion gap: 10 (ref 5–15)
BUN: 37 mg/dL — ABNORMAL HIGH (ref 8–23)
CO2: 26 mmol/L (ref 22–32)
Calcium: 8.5 mg/dL — ABNORMAL LOW (ref 8.9–10.3)
Chloride: 101 mmol/L (ref 98–111)
Creatinine, Ser: 0.94 mg/dL (ref 0.44–1.00)
GFR calc Af Amer: >60 mL/min (ref >60)
GFR calc non Af Amer: >60 mL/min (ref >60)
Glucose, Bld: 172 mg/dL — ABNORMAL HIGH (ref 70–99)
Potassium: 4.7 mmol/L (ref 3.5–5.1)
Sodium: 137 mmol/L (ref 135–145)
Total Bilirubin: 0.6 mg/dL (ref 0.3–1.2)
Total Protein: 6.4 g/dL — ABNORMAL LOW (ref 6.5–8.1)

## 2018-12-14 LAB — CBC
HCT: 44.8 % (ref 36.0–46.0)
Hemoglobin: 15 g/dL (ref 12.0–15.0)
MCH: 30.8 pg (ref 26.0–34.0)
MCHC: 33.5 g/dL (ref 30.0–36.0)
MCV: 92 fL (ref 80.0–100.0)
Platelets: 241 10*3/uL (ref 150–400)
RBC: 4.87 MIL/uL (ref 3.87–5.11)
RDW: 12.3 % (ref 11.5–15.5)
WBC: 6.5 10*3/uL (ref 4.0–10.5)
nRBC: 0 % (ref 0.0–0.2)

## 2018-12-14 LAB — MAGNESIUM: Magnesium: 2.5 mg/dL — ABNORMAL HIGH (ref 1.7–2.4)

## 2018-12-14 LAB — D-DIMER, QUANTITATIVE: D-Dimer, Quant: 1.14 ug/mL-FEU — ABNORMAL HIGH (ref 0.00–0.50)

## 2018-12-14 LAB — C-REACTIVE PROTEIN: CRP: 2 mg/dL — ABNORMAL HIGH (ref <1.0)

## 2018-12-14 LAB — FERRITIN: Ferritin: 222 ng/mL (ref 11–307)

## 2018-12-14 LAB — BRAIN NATRIURETIC PEPTIDE: B Natriuretic Peptide: 81.4 pg/mL (ref 0.0–100.0)

## 2018-12-14 LAB — LACTATE DEHYDROGENASE: LDH: 378 U/L — ABNORMAL HIGH (ref 98–192)

## 2018-12-14 MED ORDER — HYDROCOD POLST-CPM POLST ER 10-8 MG/5ML PO SUER
5.0000 mL | Freq: Two times a day (BID) | ORAL | Status: DC | PRN
  Administered 2018-12-15 – 2018-12-29 (×5): 5 mL via ORAL
  Filled 2018-12-14 (×5): qty 5

## 2018-12-14 MED ORDER — VITAMIN D 25 MCG (1000 UNIT) PO TABS
1000.0000 [IU] | ORAL_TABLET | Freq: Every day | ORAL | Status: DC
  Administered 2018-12-15 – 2018-12-30 (×16): 1000 [IU] via ORAL
  Filled 2018-12-14 (×17): qty 1

## 2018-12-14 MED ORDER — CHLORHEXIDINE GLUCONATE CLOTH 2 % EX PADS
6.0000 | MEDICATED_PAD | Freq: Every day | CUTANEOUS | Status: DC
  Administered 2018-12-14 – 2018-12-16 (×4): 6 via TOPICAL

## 2018-12-14 MED ORDER — METHYLPREDNISOLONE SODIUM SUCC 125 MG IJ SOLR
60.0000 mg | Freq: Three times a day (TID) | INTRAMUSCULAR | Status: DC
  Administered 2018-12-14 – 2018-12-18 (×12): 60 mg via INTRAVENOUS
  Filled 2018-12-14 (×12): qty 2

## 2018-12-14 MED ORDER — IPRATROPIUM-ALBUTEROL 20-100 MCG/ACT IN AERS: 1.0000 | INHALATION_SPRAY | Freq: Four times a day (QID) | RESPIRATORY_TRACT | Status: DC | PRN

## 2018-12-14 MED ORDER — IOHEXOL 350 MG/ML SOLN
75.0000 mL | Freq: Once | INTRAVENOUS | Status: AC | PRN
  Administered 2018-12-14: 75 mL via INTRAVENOUS

## 2018-12-14 NOTE — Consult Note (Signed)
NAME:  Kim Harrison, MRN:  [id number], DOB:  04/29/1955, LOS: 4 ADMISSION DATE:  12/05/2018, CONSULTATION DATE:  12/14/2018 REFERRING MD:  Dr. Tamela Gammon, [facility], CHIEF COMPLAINT:  Short of breath   Brief History   63 yo female former smoker developed fever and chills at end of October 2020.  She tested positive for COVID 19 on 12/06/18 and prescribed ABx and steroids as outpt.  She presented to ER in [place] on 12/17/2018 with worsening dyspnea and cough.  Found to have hypoxia and b/l pulmonary infiltrates consistent with COVID 19 pneumonia.  Hypoxia progressed and transferred to ICU 12/14/18.  History of present illness   She was diagnosed with sarcoidosis by lung biopsy years ago in [place] been on therapy for sarcoidosis for several years and was told this is in remission.    She started feeling sick on October 31.  She developed fever and chills.  Then developed a cough, shortness of breath and fatigue.  Went to urgent care and prescribed ABx and prednisone.  She was told she had double pneumonia.  She didn't improve.  Went back to urgent care and was getting a steroid injection.  During this she felt faint and passed out briefly.  She was sent to [facility] ER.  Determined that she did have COVID pneumonia and admitted to [facility].  Tx with steroids, remdesivir, tocilizumab, and convalescent plasma.  Had worsening hypoxia and transferred to ICU.  Repeat CT angio chest (reviewed by me) was negative for PE, but did show progression of ASD from [place].  She feels fatigued.  Still has cough.  Not having chest pain.  Headache better.  Denies loss of smell or taste.  Not having diarrhea.  Past Medical History  Sarcoidosis, Cervical cancer, Hypothyroidism, Rew Hospital Events   11/09 Admit 11/14 Transfer to ICU  Consults:    Procedures:    Significant Diagnostic Tests:  CT angio chest 11/09 >> LN up to 1.3 cm, mild centrilobular emphysema, b/l GGO with interlobular septal  thickening CT angio chest 11/14 >> progression of ASD, no PE  Micro Data:  SARS VoV2 11/06 >> Positive  COVID Therapy:  Steroids 11/09 >> Remdesivir 11/09 >>  Convalescent plasma 11/11 Tocilizumab 11/11 Tocilizumab 11/13  Antimicrobials:    Interim history/subjective:    Objective   Blood pressure 93/62, pulse (!) 58, temperature 97.8 F (36.6 C), temperature source Oral, resp. rate 13, height 5\' 5"  (1.651 m), weight 72.6 kg, SpO2 96 %.    FiO2 (%):  [90 %-100 %] 90 %   Intake/Output Summary (Last 24 hours) at 12/14/2018 1329 Last data filed at 12/14/2018 0545 Gross per 24 hour  Intake 120 ml  Output 600 ml  Net -480 ml   Filed Weights   12/28/2018 1930  Weight: 72.6 kg    Examination:  General - alert, laying in bed on her side Eyes - pupils reactive ENT - no sinus tenderness, no stridor Cardiac - regular rate/rhythm, no murmur Chest - b/l rhonchi with faint inspiratory squeaks, no wheeze Abdomen - soft, non tender, + bowel sounds Extremities - no cyanosis, clubbing, or edema Skin - no rashes Neuro - normal strength, moves extremities, follows commands Lymphatics - no lymphadenopathy Psych - normal mood and behavior   Resolved Hospital Problem list     Assessment & Plan:   Acute hypoxic respiratory failure from COVID 19 pneumonia with worsening hypoxia 11/14. - day 6 of decadron - day 6 of remdesivir - received tocilizumab  x 2 doses, and convalescent plasma - goal SpO2 85 to 95% - prone positioning as able - continue zinc, vit c - change BDs to prn  Steroid induced hyperglycemia. - SSI  Hx of hypothyroidism. - continue synthroid  Hx of HLD. - continue pravachol  Anticoagulation. - has reported allergy to component in lovenox - continue fondaparinux  Best practice:  Diet: Regular DVT prophylaxis: Fondaparinux GI prophylaxis: protonix Mobility: Up in chair Code Status: full code Disposition: ICU  Labs   CBC: Recent Labs  Lab  12/19/2018 1944 12/10/18 0359 12/12/18 0110 12/13/18 0049 12/14/18 0429  WBC 9.7 9.0 5.0 7.6 6.5  NEUTROABS  --  7.8*  --   --   --   HGB 14.7 13.4 13.0 13.2 15.0  HCT 44.0 40.6 39.9 40.0 44.8  MCV 91.7 92.3 92.4 91.7 92.0  PLT 233 207 215 238 [id number]    Basic Metabolic Panel: Recent Labs  Lab 12/23/2018 1944 12/10/18 0208 12/12/18 0110 12/13/18 0049 12/14/18 0429  NA 137 134* 132* 139 137  K 4.4 4.0 4.1 4.3 4.7  CL 105 104 99 106 101  CO2 22 21* 23 24 26   GLUCOSE 126* 100* 147* 155* 172*  BUN 20 20 28* 27* 37*  CREATININE 1.16* 0.96 0.90 0.80 0.94  CALCIUM 8.9 8.7* 8.1* 8.3* 8.5*  MG  --   --  2.1 2.4 2.5*   GFR: Estimated Creatinine Clearance: 60.3 mL/min (by C-G formula based on SCr of 0.94 mg/dL). Recent Labs  Lab 12/10/18 0359 12/11/18 0350 12/12/18 0110 12/13/18 0049 12/14/18 0429  PROCALCITON  --  <0.10  --   --   --   WBC 9.0  --  5.0 7.6 6.5    Liver Function Tests: Recent Labs  Lab 12/10/18 0208 12/12/18 0110 12/13/18 0049 12/14/18 0429  AST 43* 29 43* 62*  ALT 51* 38 58* 123*  ALKPHOS 57 58 55 61  BILITOT 0.6 0.6 0.5 0.6  PROT 6.2* 6.1* 5.6* 6.4*  ALBUMIN 2.6* 2.6* 2.4* 2.8*   No results for input(s): LIPASE, AMYLASE in the last 168 hours. No results for input(s): AMMONIA in the last 168 hours.  ABG No results found for: PHART, PCO2ART, PO2ART, HCO3, TCO2, ACIDBASEDEF, O2SAT   Coagulation Profile: No results for input(s): INR, PROTIME in the last 168 hours.  Cardiac Enzymes: No results for input(s): CKTOTAL, CKMB, CKMBINDEX, TROPONINI in the last 168 hours.  HbA1C: No results found for: HGBA1C  CBG: No results for input(s): GLUCAP in the last 168 hours.  Review of Systems:   Reviewed and negative  Past Medical History  She,  has a past medical history of Cervical cancer ([place]) and Sarcoidosis.   Surgical History    Past Surgical History:  Procedure Laterality Date  . ABDOMINAL HYSTERECTOMY  1989  . [place]      Social History   reports that she quit smoking about 26 years ago. Her smoking use included cigarettes. She smoked 3.00 packs per day for 0.00 years. She has never used smokeless tobacco. She reports current alcohol use. She reports that she does not use drugs.   Family History   Her family history includes Allergies in her mother; Autoimmune disease in her cousin; Breast cancer in her mother; Lung cancer in her cousin, maternal grandfather, maternal uncle, maternal uncle, and maternal uncle; Multiple sclerosis in her sister; Pulmonary fibrosis in her mother; Rheum arthritis in her mother.   Allergies Allergies  Allergen Reactions  .  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  . Cephalexin Anaphylaxis  . Penicillins Anaphylaxis    Did it involve swelling of the face/tongue/throat, SOB, or low BP? Yes Did it involve sudden or severe rash/hives, skin peeling, or any reaction on the inside of your mouth or nose? No Did you need to seek medical attention at a hospital or doctor's office? Yes When did it last happen?couple of years ago If all above answers are "NO", may proceed with cephalosporin use.   Marland Kitchen Beef-Derived Products   . Pork-Derived Products   . Sulfonamide Derivatives Hives     Home Medications  Prior to Admission medications   Medication Sig Start Date End Date Taking? Authorizing Provider  cholecalciferol (VITAMIN D3) 25 MCG (1000 UT) tablet Take 1,000 Units by mouth daily.   Yes [provider]  DIGESTIVE ENZYMES PO Take 1 capsule by mouth daily.   Yes [provider]  EPINEPHrine (EPIPEN 2-PAK) 0.3 mg/0.3 mL IJ SOAJ injection Inject 0.3 mg into the muscle once.   Yes [provider]  Multiple Vitamins-Minerals (ZINC PO) Take 1 tablet by mouth daily.   Yes [provider]  NEXIUM 40 MG capsule Take 1 capsule by mouth daily. 05/13/13  Yes [provider]  predniSONE (DELTASONE) 20 MG tablet Take 40 mg by  mouth daily. 7 day course 12/06/18  Yes [provider]  Probiotic Product (PROBIOTIC PO) Take 1 capsule by mouth daily.   Yes [provider]  SYNTHROID 75 MCG tablet Take 1 tablet by mouth daily. 05/13/13  Yes [provider]  VENTOLIN HFA 108 (90 Base) MCG/ACT inhaler Inhale 2 puffs into the lungs every 4 (four) hours as needed for shortness of breath. 12/01/18  Yes [provider]     I updated pt's husband by phone.  D/w Dr. Tamela Gammon.  Chesley Mires, MD [facility] Pulmonary/Critical Care 12/14/2018, 1:35 PM

## 2018-12-14 NOTE — Progress Notes (Signed)
Rapid Response Team Rounding Note: Patient requiring 15L high flow nasal cannula in addition to non-rebreather. RT assessed respiratory status. Diminished lung sounds. Labored with exertion. sp02 in 70s with activity, but rebounds to low 90s with rest. Patient alert and oriented, but complains of feeling increasing disorientation with prolonged hospital stay. RN and RT will continue to round throughout the day.

## 2018-12-14 NOTE — Progress Notes (Signed)
Pt on 15L salter and 15L via non-rebreather mask. Having some labored breathing but overall in no distress. Pt states she has not been able to do much activity and does drop her sats into the 70s with any activity. ICU charge aware and discussed with Dr. Reesa Chew concerns over oxygen needs. Plan for probable ICU transfer today.

## 2018-12-14 NOTE — Plan of Care (Signed)
Patient Summary: No adverse events noted overnight. No c/o pain reported. Patient self proned throughout the night. Remained on HFNC 15L with intermittent NRB for movement in bed and increased work of breathing. All lines remain intact. Patient SB without ectopy throughout the most of the night. HR 60's-70's. Patient sat >92% most of the night. Plan of care continued. Bed in low locked position. Problem: Education: Goal: Knowledge of risk factors and measures for prevention of condition will improve Outcome: Progressing   Problem: Coping: Goal: Psychosocial and spiritual needs will be supported Outcome: Progressing   Problem: Respiratory: Goal: Will maintain a patent airway Outcome: Progressing Goal: Complications related to the disease process, condition or treatment will be avoided or minimized Outcome: Progressing

## 2018-12-14 NOTE — Progress Notes (Signed)
Spouse updated on transfer to ICU. Updated on plan of care and patient status. No further questions at this time.

## 2018-12-14 NOTE — Progress Notes (Signed)
PT Cancellation Note  Patient Details Name: Kim Harrison MRN: [id number] DOB: [birth year]   Cancelled Treatment:    Reason Eval/Treat Not Completed: Medical issues which prohibited therapy. Pt has been moved to ICU.  Horald Chestnut, PT    Delford Field 12/14/2018, 2:03 PM

## 2018-12-14 NOTE — Progress Notes (Signed)
Patient on the phone with family providing them an update. All questions answered

## 2018-12-14 NOTE — Progress Notes (Addendum)
PROGRESS NOTE    Zai Lyndon  [id number] DOB: [birth year] DOA: 12/10/2018 PCP: Lou Miner, MD   Brief Narrative:  63 year old with sarcoidosis, hypothyroidism, HDL presented with 10 days of fevers and chills.  Initially went to urgent care and was tested negative.  Started on steroids and antibiotics but due to minimal improvement in symptoms and an episode of syncopal episode she was taken to [facility] where she was tested positive for COVID-19 pneumonia.  Despite of steroids, remdesivir, plasma and Actemra she was remaining hypoxic on 15 L high flow/nonrebreather.  Transferred to ICU.  Initial CTA of the chest was negative for PE but it was repeated on 11/14   Assessment & Plan:   Principal Problem:   Acute respiratory failure with hypoxia (HCC) Active Problems:   Pneumonia due to COVID-19 virus   Hypothyroidism   Sarcoidosis    Acute hypoxic respiratory failure secondary to COVID-19 pneumonia; worsening due to significant hypoxia. Longer recovery.  -Tested + November 6. Still on 15L HF with NRB. Will start needing Heated high flow therefore will have to transfer her to the ICU.  -Solumedrol IV, Remdesivir D4 -IV convalescent plasma ordered 11/11 -s/p Actemra & plasma 11/11. S/p 2nd Dose of Actemra.  -Vitamin C and zinc -Trend Inflammatory markers.  -Supplemental oxygen. CXR shows worsening of infiltrates. Obtain repeat CTA chest today.  -Bronchodilators. -Has received Lasix IV   Hypothyroidism -Synthroid  Hyperlipidemia -Pravachol  History of sarcoidosis -On steroids at home   DVT prophylaxis: Fondaparinux Code Status: Full  Family Communication:  Spoke with Shanon Brow.  Disposition Plan: Transfer to ICU  Consultants:   Pulm; Dr Halford Chessman  Procedures:   None  Subjective: Easily desaturates down to 60% requiring 15 L high flow with nonrebreather. Otherwise tells me symptomatically she does not feel better or worse.  Review of Systems  Otherwise negative except as per HPI, including: General = no fevers, chills, dizziness, malaise, fatigue HEENT/EYES = negative for pain, redness, loss of vision, double vision, blurred vision, loss of hearing, sore throat, hoarseness, dysphagia Cardiovascular= negative for chest pain, palpitation, murmurs, lower extremity swelling Respiratory/lungs= negative for  hemoptysis, wheezing, mucus production Gastrointestinal= negative for nausea, vomiting,, abdominal pain, melena, hematemesis Genitourinary= negative for Dysuria, Hematuria, Change in Urinary Frequency MSK = Negative for arthralgia, myalgias, Back Pain, Joint swelling  Neurology= Negative for headache, seizures, numbness, tingling  Psychiatry= Negative for anxiety, depression, suicidal and homocidal ideation Allergy/Immunology= Medication/Food allergy as listed  Skin= Negative for Rash, lesions, ulcers, itching   Objective: Vitals:   12/14/18 0500 12/14/18 0600 12/14/18 0800 12/14/18 0901  BP:   102/65 102/65  Pulse: (!) 53 (!) 50 (!) 56 76  Resp: 13 14 15  (!) 22  Temp:   97.6 F (36.4 C)   TempSrc:   Oral   SpO2: 98% 99% 97% (!) 76%  Weight:      Height:        Intake/Output Summary (Last 24 hours) at 12/14/2018 1141 Last data filed at 12/14/2018 0545 Gross per 24 hour  Intake 120 ml  Output 600 ml  Net -480 ml   Filed Weights   12/01/2018 1930  Weight: 72.6 kg    Examination:  Constitutional: 15 L high flow with nonrebreather, minimal distress Respiratory: Diffuse rhonchi Cardiovascular: Normal sinus rhythm, no rubs Abdomen: Nontender nondistended good bowel sounds Musculoskeletal: No edema noted Skin: No rashes seen Neurologic: CN 2-12 grossly intact.  And nonfocal Psychiatric: Normal judgment and insight. Alert and oriented x 3. Normal mood.  Data Reviewed:   CBC: Recent Labs  Lab 12/29/2018 1944 12/10/18 0359 12/12/18 0110 12/13/18 0049 12/14/18 0429  WBC 9.7 9.0 5.0 7.6 6.5  NEUTROABS  --   7.8*  --   --   --   HGB 14.7 13.4 13.0 13.2 15.0  HCT 44.0 40.6 39.9 40.0 44.8  MCV 91.7 92.3 92.4 91.7 92.0  PLT 233 207 215 238 [id number]   Basic Metabolic Panel: Recent Labs  Lab 12/25/2018 1944 12/10/18 0208 12/12/18 0110 12/13/18 0049 12/14/18 0429  NA 137 134* 132* 139 137  K 4.4 4.0 4.1 4.3 4.7  CL 105 104 99 106 101  CO2 22 21* 23 24 26   GLUCOSE 126* 100* 147* 155* 172*  BUN 20 20 28* 27* 37*  CREATININE 1.16* 0.96 0.90 0.80 0.94  CALCIUM 8.9 8.7* 8.1* 8.3* 8.5*  MG  --   --  2.1 2.4 2.5*   GFR: Estimated Creatinine Clearance: 60.3 mL/min (by C-G formula based on SCr of 0.94 mg/dL). Liver Function Tests: Recent Labs  Lab 12/10/18 0208 12/12/18 0110 12/13/18 0049 12/14/18 0429  AST 43* 29 43* 62*  ALT 51* 38 58* 123*  ALKPHOS 57 58 55 61  BILITOT 0.6 0.6 0.5 0.6  PROT 6.2* 6.1* 5.6* 6.4*  ALBUMIN 2.6* 2.6* 2.4* 2.8*   No results for input(s): LIPASE, AMYLASE in the last 168 hours. No results for input(s): AMMONIA in the last 168 hours. Coagulation Profile: No results for input(s): INR, PROTIME in the last 168 hours. Cardiac Enzymes: No results for input(s): CKTOTAL, CKMB, CKMBINDEX, TROPONINI in the last 168 hours. BNP (last 3 results) No results for input(s): PROBNP in the last 8760 hours. HbA1C: No results for input(s): HGBA1C in the last 72 hours. CBG: No results for input(s): GLUCAP in the last 168 hours. Lipid Profile: No results for input(s): CHOL, HDL, LDLCALC, TRIG, CHOLHDL, LDLDIRECT in the last 72 hours. Thyroid Function Tests: No results for input(s): TSH, T4TOTAL, FREET4, T3FREE, THYROIDAB in the last 72 hours. Anemia Panel: Recent Labs    12/13/18 0049 12/14/18 0429  FERRITIN 312* 222   Sepsis Labs: Recent Labs  Lab 12/11/18 0350  PROCALCITON <0.10    No results found for this or any previous visit (from the past 240 hour(s)).       Radiology Studies: Dg Chest Port 1 View  Result Date: 12/13/2018 CLINICAL DATA:  Dyspnea  EXAM: PORTABLE CHEST 1 VIEW COMPARISON:  Portable exam 0556 hours compared to 12/20/2018 FINDINGS: Stable heart size mediastinal contours. Diffuse interstitial infiltrates bilaterally, slightly increased, consistent with pneumonia and history of COVID-19. No pleural effusion or pneumothorax. Bones unremarkable. IMPRESSION: Increased BILATERAL pulmonary infiltrates consistent with pneumonia. Electronically Signed   By: Lavonia Dana M.D.   On: 12/13/2018 07:58        Scheduled Meds: . chlorpheniramine-HYDROcodone  5 mL Oral Q12H  . cholecalciferol  1,000 Units Oral Daily  . fluticasone  1 puff Inhalation BID  . fondaparinux (ARIXTRA) injection  2.5 mg Subcutaneous Q0600  . Ipratropium-Albuterol  1 puff Inhalation Q6H  . levothyroxine  75 mcg Oral Daily  . mouth rinse  15 mL Mouth Rinse BID  . methylPREDNISolone (SOLU-MEDROL) injection  60 mg Intravenous Q8H  . pantoprazole  40 mg Oral Daily  . pravastatin  40 mg Oral Daily  . vitamin C  500 mg Oral Daily  . zinc sulfate  220 mg Oral Daily   Continuous Infusions:   LOS: 4 days   Time spent= 35  mins    Yeiden Frenkel Arsenio Loader, MD [facility]  If 7PM-7AM, please contact night-coverage  12/14/2018, 11:41 AM

## 2018-12-15 DIAGNOSIS — U071 COVID-19: Secondary | ICD-10-CM | POA: Diagnosis not present

## 2018-12-15 DIAGNOSIS — J9601 Acute respiratory failure with hypoxia: Secondary | ICD-10-CM | POA: Diagnosis not present

## 2018-12-15 DIAGNOSIS — J1289 Other viral pneumonia: Secondary | ICD-10-CM | POA: Diagnosis not present

## 2018-12-15 LAB — CBC
HCT: 43.6 % (ref 36.0–46.0)
Hemoglobin: 14.4 g/dL (ref 12.0–15.0)
MCH: 30.1 pg (ref 26.0–34.0)
MCHC: 33 g/dL (ref 30.0–36.0)
MCV: 91 fL (ref 80.0–100.0)
Platelets: 248 10*3/uL (ref 150–400)
RBC: 4.79 MIL/uL (ref 3.87–5.11)
RDW: 12.2 % (ref 11.5–15.5)
WBC: 12.4 10*3/uL — ABNORMAL HIGH (ref 4.0–10.5)
nRBC: 0 % (ref 0.0–0.2)

## 2018-12-15 LAB — MAGNESIUM: Magnesium: 2.6 mg/dL — ABNORMAL HIGH (ref 1.7–2.4)

## 2018-12-15 LAB — LACTATE DEHYDROGENASE: LDH: 331 U/L — ABNORMAL HIGH (ref 98–192)

## 2018-12-15 LAB — BASIC METABOLIC PANEL
Anion gap: 9 (ref 5–15)
BUN: 35 mg/dL — ABNORMAL HIGH (ref 8–23)
CO2: 27 mmol/L (ref 22–32)
Calcium: 8.5 mg/dL — ABNORMAL LOW (ref 8.9–10.3)
Chloride: 100 mmol/L (ref 98–111)
Creatinine, Ser: 0.95 mg/dL (ref 0.44–1.00)
GFR calc Af Amer: >60 mL/min (ref >60)
GFR calc non Af Amer: >60 mL/min (ref >60)
Glucose, Bld: 183 mg/dL — ABNORMAL HIGH (ref 70–99)
Potassium: 4.7 mmol/L (ref 3.5–5.1)
Sodium: 136 mmol/L (ref 135–145)

## 2018-12-15 LAB — FERRITIN: Ferritin: 199 ng/mL (ref 11–307)

## 2018-12-15 LAB — D-DIMER, QUANTITATIVE: D-Dimer, Quant: 1.09 ug/mL-FEU — ABNORMAL HIGH (ref 0.00–0.50)

## 2018-12-15 LAB — MRSA PCR SCREENING: MRSA by PCR: NEGATIVE

## 2018-12-15 LAB — HEMOGLOBIN A1C
Hgb A1c MFr Bld: 6.2 % — ABNORMAL HIGH (ref 4.8–5.6)
Mean Plasma Glucose: 131.24 mg/dL

## 2018-12-15 LAB — GLUCOSE, CAPILLARY
Glucose-Capillary: 201 mg/dL — ABNORMAL HIGH (ref 70–99)
Glucose-Capillary: 229 mg/dL — ABNORMAL HIGH (ref 70–99)

## 2018-12-15 LAB — C-REACTIVE PROTEIN: CRP: 0.9 mg/dL (ref <1.0)

## 2018-12-15 LAB — BRAIN NATRIURETIC PEPTIDE: B Natriuretic Peptide: 60.6 pg/mL (ref 0.0–100.0)

## 2018-12-15 MED ORDER — ENSURE ENLIVE PO LIQD
237.0000 mL | Freq: Three times a day (TID) | ORAL | Status: DC
  Administered 2018-12-15 – 2018-12-30 (×40): 237 mL via ORAL
  Filled 2018-12-15 (×26): qty 237

## 2018-12-15 MED ORDER — INSULIN ASPART 100 UNIT/ML ~~LOC~~ SOLN
0.0000 [IU] | Freq: Every day | SUBCUTANEOUS | Status: DC
  Administered 2018-12-15: 2 [IU] via SUBCUTANEOUS
  Administered 2018-12-16 – 2018-12-19 (×3): 3 [IU] via SUBCUTANEOUS
  Administered 2018-12-20 – 2018-12-30 (×3): 2 [IU] via SUBCUTANEOUS

## 2018-12-15 MED ORDER — INSULIN ASPART 100 UNIT/ML ~~LOC~~ SOLN
0.0000 [IU] | Freq: Three times a day (TID) | SUBCUTANEOUS | Status: DC
  Administered 2018-12-15 (×2): 7 [IU] via SUBCUTANEOUS
  Administered 2018-12-16: 4 [IU] via SUBCUTANEOUS
  Administered 2018-12-16 (×2): 7 [IU] via SUBCUTANEOUS
  Administered 2018-12-17: 4 [IU] via SUBCUTANEOUS
  Administered 2018-12-17: 15 [IU] via SUBCUTANEOUS
  Administered 2018-12-17 – 2018-12-18 (×2): 7 [IU] via SUBCUTANEOUS
  Administered 2018-12-18: 20 [IU] via SUBCUTANEOUS
  Administered 2018-12-18: 09:00:00 7 [IU] via SUBCUTANEOUS
  Administered 2018-12-19: 11 [IU] via SUBCUTANEOUS
  Administered 2018-12-19: 15 [IU] via SUBCUTANEOUS
  Administered 2018-12-19: 7 [IU] via SUBCUTANEOUS
  Administered 2018-12-20: 11 [IU] via SUBCUTANEOUS
  Administered 2018-12-20: 3 [IU] via SUBCUTANEOUS
  Administered 2018-12-20: 4 [IU] via SUBCUTANEOUS
  Administered 2018-12-21: 3 [IU] via SUBCUTANEOUS
  Administered 2018-12-21: 4 [IU] via SUBCUTANEOUS
  Administered 2018-12-21: 7 [IU] via SUBCUTANEOUS
  Administered 2018-12-22: 4 [IU] via SUBCUTANEOUS
  Administered 2018-12-22: 7 [IU] via SUBCUTANEOUS
  Administered 2018-12-22: 4 [IU] via SUBCUTANEOUS
  Administered 2018-12-23 (×2): 3 [IU] via SUBCUTANEOUS
  Administered 2018-12-23 – 2018-12-25 (×3): 4 [IU] via SUBCUTANEOUS
  Administered 2018-12-25: 2 [IU] via SUBCUTANEOUS
  Administered 2018-12-26: 3 [IU] via SUBCUTANEOUS
  Administered 2018-12-26: 4 [IU] via SUBCUTANEOUS
  Administered 2018-12-27: 7 [IU] via SUBCUTANEOUS
  Administered 2018-12-28: 3 [IU] via SUBCUTANEOUS
  Administered 2018-12-28: 7 [IU] via SUBCUTANEOUS
  Administered 2018-12-29: 3 [IU] via SUBCUTANEOUS
  Administered 2018-12-29: 4 [IU] via SUBCUTANEOUS
  Administered 2018-12-29 – 2018-12-30 (×2): 3 [IU] via SUBCUTANEOUS

## 2018-12-15 NOTE — Progress Notes (Signed)
NAME:  Kim Harrison, MRN:  [id number], DOB:  Nov 23, 1955, LOS: 5 ADMISSION DATE:  12/24/2018, CONSULTATION DATE:  12/14/2018 REFERRING MD:  Dr. Tamela Gammon, [facility], CHIEF COMPLAINT:  Short of breath   Brief History   63 yo female former smoker developed fever and chills at end of October 2020.  She tested positive for COVID 19 on 12/06/18 and prescribed ABx and steroids as outpt.  She presented to ER in [place] on 12/13/2018 with worsening dyspnea and cough.  Found to have hypoxia and b/l pulmonary infiltrates consistent with COVID 19 pneumonia.  Hypoxia progressed and transferred to ICU 12/14/18.  Past Medical History  Sarcoidosis, Cervical cancer, Hypothyroidism, [place] [facility] Events   11/09 Admit 11/14 Transfer to ICU  Consults:    Procedures:    Significant Diagnostic Tests:  CT angio chest 11/09 >> LN up to 1.3 cm, mild centrilobular emphysema, b/l GGO with interlobular septal thickening CT angio chest 11/14 >> progression of ASD, no PE  Micro Data:  SARS VoV2 11/06 >> Positive  COVID Therapy:  Steroids 11/09 >> Remdesivir 11/09 >> 11/14 Convalescent plasma 11/11 Tocilizumab 11/11 Tocilizumab 11/13  Antimicrobials:    Interim history/subjective:  Slept okay.  Breathing better this morning.  Still has dry cough.  Denies abdominal pain.  Not much appetite.  Objective   Blood pressure 122/75, pulse (!) 58, temperature (!) 97.3 F (36.3 C), temperature source Axillary, resp. rate 16, height 5\' 5"  (1.651 m), weight 62.9 kg, SpO2 95 %.    FiO2 (%):  [80 %-100 %] 80 %   Intake/Output Summary (Last 24 hours) at 12/15/2018 1047 Last data filed at 12/15/2018 0900 Gross per 24 hour  Intake 800 ml  Output 700 ml  Net 100 ml   Filed Weights   12/07/2018 1930 12/15/18 0600  Weight: 72.6 kg 62.9 kg    Examination:  General - alert Eyes - pupils reactive ENT - no sinus tenderness, no stridor Cardiac - regular rate/rhythm, no murmur Chest - scattered  rhonchi, better air movement Abdomen - soft, non tender, + bowel sounds Extremities - no cyanosis, clubbing, or edema Skin - no rashes Neuro - normal strength, moves extremities, follows commands Psych - normal mood and behavior  Resolved Hospital Problem list     Assessment & Plan:   Acute hypoxic respiratory failure from COVID 19 pneumonia with worsening hypoxia 11/14. - completed remdesivir, received tocilizumab x 2 doses, and convalescent plasma - day 7/10 of steroids - goal SpO2 85 to 95% - continue zin, vit C - f/u CXR intermittently - prn BDs - prone positioning as able - bronchial hygiene - mobilize as tolerated  Hx of sarcoidosis. - has been in remission for years, not an active issue, and wasn't on therapy for this for years  Steroid induced hyperglycemia. - SSI  Hx of hypothyroidism. - continue synthroid  Hx of HLD. - continue pravachol  Anticoagulation. - has reported allergy to component in lovenox - continue fondaparinux  Best practice:  Diet: Regular DVT prophylaxis: Fondaparinux GI prophylaxis: protonix Mobility: Up in chair Code Status: full code Disposition: ICU  Labs    CMP Latest Ref Rng & Units 12/15/2018 12/14/2018 12/13/2018  Glucose 70 - 99 mg/dL 183(H) 172(H) 155(H)  BUN 8 - 23 mg/dL 35(H) 37(H) 27(H)  Creatinine 0.44 - 1.00 mg/dL 0.95 0.94 0.80  Sodium 135 - 145 mmol/L 136 137 139  Potassium 3.5 - 5.1 mmol/L 4.7 4.7 4.3  Chloride 98 - 111 mmol/L 100 101 106  CO2 22 - 32 mmol/L 27 26 24   Calcium 8.9 - 10.3 mg/dL 8.5(L) 8.5(L) 8.3(L)  Total Protein 6.5 - 8.1 g/dL - 6.4(L) 5.6(L)  Total Bilirubin 0.3 - 1.2 mg/dL - 0.6 0.5  Alkaline Phos 38 - 126 U/L - 61 55  AST 15 - 41 U/L - 62(H) 43(H)  ALT 0 - 44 U/L - 123(H) 58(H)    CBC Latest Ref Rng & Units 12/15/2018 12/14/2018 12/13/2018  WBC 4.0 - 10.5 K/uL 12.4(H) 6.5 7.6  Hemoglobin 12.0 - 15.0 g/dL 14.4 15.0 13.2  Hematocrit 36.0 - 46.0 % 43.6 44.8 40.0  Platelets 150 - 400 K/uL  248 241 238    CBG (last 3)  No results for input(s): GLUCAP in the last 72 hours.      Component Value Date/Time   CRP 0.9 12/15/2018 0600   CRP 2.0 (H) 12/14/2018 0429   CRP 4.1 (H) 12/13/2018 0049    Chesley Mires, MD [facility] 12/15/2018, 10:50 AM

## 2018-12-15 NOTE — Progress Notes (Signed)
Patients husband Shanon Brow called and updates were given at this time and all questions were answered.

## 2018-12-15 NOTE — Progress Notes (Signed)
Spoke with pt's husband over the phone.  Updated about current status and treatment plan.  Explained need for SSI while on steroids.  Explained that she is showing some signs of improvement, and cautiously optimistic about recovery.  Chesley Mires, MD [facility] 12/15/2018, 2:02 PM

## 2018-12-16 DIAGNOSIS — D869 Sarcoidosis, unspecified: Secondary | ICD-10-CM

## 2018-12-16 DIAGNOSIS — U071 COVID-19: Secondary | ICD-10-CM | POA: Diagnosis not present

## 2018-12-16 DIAGNOSIS — J1289 Other viral pneumonia: Secondary | ICD-10-CM | POA: Diagnosis not present

## 2018-12-16 DIAGNOSIS — J9601 Acute respiratory failure with hypoxia: Secondary | ICD-10-CM | POA: Diagnosis not present

## 2018-12-16 LAB — CBC
HCT: 43.1 % (ref 36.0–46.0)
Hemoglobin: 14.4 g/dL (ref 12.0–15.0)
MCH: 30.3 pg (ref 26.0–34.0)
MCHC: 33.4 g/dL (ref 30.0–36.0)
MCV: 90.5 fL (ref 80.0–100.0)
Platelets: 282 10*3/uL (ref 150–400)
RBC: 4.76 MIL/uL (ref 3.87–5.11)
RDW: 12.1 % (ref 11.5–15.5)
WBC: 14 10*3/uL — ABNORMAL HIGH (ref 4.0–10.5)
nRBC: 0 % (ref 0.0–0.2)

## 2018-12-16 LAB — C-REACTIVE PROTEIN: CRP: <0.8 mg/dL (ref <1.0)

## 2018-12-16 LAB — BASIC METABOLIC PANEL
Anion gap: 8 (ref 5–15)
BUN: 37 mg/dL — ABNORMAL HIGH (ref 8–23)
CO2: 29 mmol/L (ref 22–32)
Calcium: 8.6 mg/dL — ABNORMAL LOW (ref 8.9–10.3)
Chloride: 101 mmol/L (ref 98–111)
Creatinine, Ser: 0.92 mg/dL (ref 0.44–1.00)
GFR calc Af Amer: >60 mL/min (ref >60)
GFR calc non Af Amer: >60 mL/min (ref >60)
Glucose, Bld: 175 mg/dL — ABNORMAL HIGH (ref 70–99)
Potassium: 5.2 mmol/L — ABNORMAL HIGH (ref 3.5–5.1)
Sodium: 138 mmol/L (ref 135–145)

## 2018-12-16 LAB — GLUCOSE, CAPILLARY
Glucose-Capillary: 159 mg/dL — ABNORMAL HIGH (ref 70–99)
Glucose-Capillary: 235 mg/dL — ABNORMAL HIGH (ref 70–99)
Glucose-Capillary: 229 mg/dL — ABNORMAL HIGH (ref 70–99)
Glucose-Capillary: 272 mg/dL — ABNORMAL HIGH (ref 70–99)

## 2018-12-16 LAB — D-DIMER, QUANTITATIVE: D-Dimer, Quant: 1.08 ug/mL-FEU — ABNORMAL HIGH (ref 0.00–0.50)

## 2018-12-16 LAB — MAGNESIUM: Magnesium: 2.5 mg/dL — ABNORMAL HIGH (ref 1.7–2.4)

## 2018-12-16 LAB — LACTATE DEHYDROGENASE: LDH: 430 U/L — ABNORMAL HIGH (ref 98–192)

## 2018-12-16 LAB — FERRITIN: Ferritin: 338 ng/mL — ABNORMAL HIGH (ref 11–307)

## 2018-12-16 MED ORDER — CHLORHEXIDINE GLUCONATE CLOTH 2 % EX PADS
6.0000 | MEDICATED_PAD | Freq: Every day | CUTANEOUS | Status: DC
  Administered 2018-12-18 – 2019-01-16 (×20): 6 via TOPICAL

## 2018-12-16 NOTE — Progress Notes (Signed)
Pt attempted to prone. Was only able to tolerate for 20 mins due to coughing and unable to catch her breath. Patient stated she will try again in a few hours. NRB has remained on pt for majority of night. [place] remains stable have not had to titrate up on FiO2 or flow.

## 2018-12-16 NOTE — Progress Notes (Signed)
Spoke with patient's husband via phone re: patient updates. Placed phone on speaker phone and husband and wife were also able to communicate. Addressed all questions and concerns. Plan of care continued. Will continue to monitor patient.

## 2018-12-16 NOTE — Progress Notes (Signed)
Attempted to place pt on 15L [place] HFNC but pt states she needs more flow spo2 75%. Placed back on Heated HFNC at 25L/70%. Pt reports feeling better on that. RN aware. Will attempt again later

## 2018-12-16 NOTE — Care Plan (Signed)
PCCM:  Attempted to call husband at numbers provided in the chart there was no answer. We will try again later.   Garner Nash, DO [facility] 12/16/2018 5:45 PM

## 2018-12-16 NOTE — Progress Notes (Signed)
NAME:  Kim Harrison, MRN:  [id number], DOB:  01/22/1956, LOS: 6 ADMISSION DATE:  12/30/2018, CONSULTATION DATE:  12/14/2018 REFERRING MD:  Dr. Tamela Gammon, [facility], CHIEF COMPLAINT:  Short of breath   Brief History   63 yo female former smoker developed fever and chills at end of October 2020.  She tested positive for COVID 19 on 12/06/18 and prescribed ABx and steroids as outpt.  She presented to ER in [place] on 12/05/2018 with worsening dyspnea and cough.  Found to have hypoxia and b/l pulmonary infiltrates consistent with COVID 19 pneumonia.  Hypoxia progressed and transferred to ICU 12/14/18.  Past Medical History  Sarcoidosis, Cervical cancer, Hypothyroidism, [place] [facility] Events   11/09 Admit 11/14 Transfer to ICU  Consults:  pccm  Procedures:    Significant Diagnostic Tests:  CT angio chest 11/09 >> LN up to 1.3 cm, mild centrilobular emphysema, b/l GGO with interlobular septal thickening CT angio chest 11/14 >> progression of ASD, no PE  Micro Data:  SARS VoV2 11/06 >> Positive  COVID Therapy:  Steroids 11/09 >> Remdesivir 11/09 >> 11/14 Convalescent plasma 11/11 Tocilizumab 11/11 Tocilizumab 11/13  Antimicrobials:    Interim history/subjective:   Patient doing well today.  Still requiring oxygen.  Able to sit up some and eat breakfast this morning.  Sats remain in low 90s.  Overall patient feels as if she is improving from yesterday.  Thankful for care.  Objective   Blood pressure 119/67, pulse 66, temperature (!) 97.2 F (36.2 C), temperature source Axillary, resp. rate 19, height 5\' 5"  (1.651 m), weight 63 kg, SpO2 94 %.    FiO2 (%):  [70 %-90 %] 70 %   Intake/Output Summary (Last 24 hours) at 12/16/2018 0756 Last data filed at 12/16/2018 0500 Gross per 24 hour  Intake 990 ml  Output 850 ml  Net 140 ml   Filed Weights   12/18/2018 1930 12/15/18 0600 12/16/18 0500  Weight: 72.6 kg 62.9 kg 63 kg    Examination:  General -alert oriented  following commands no acute distress Eyes -pupils reactive, tracking Cardiac -regular rate and rhythm, S1-S2 no MRG Chest -today she is clear to auscultation, no crackles no wheeze  abdomen -soft, nontender, nondistended, bowel sounds present Extremities -no cyanosis no edema Skin -no rash Neuro -moves all 4 extremities spontaneously no deficit Psych -normal mood  Resolved Hospital Problem list     Assessment & Plan:   Acute hypoxic respiratory failure from COVID 19 pneumonia with worsening hypoxia 11/14. -Patient has completed remdesivir, tocilizumab x2 doses plus convalescent plasma -Day 8 of 10 for steroids -Continue to maintain SPO2 between 85 and 95% Continue zinc plus vitamin C -Chest x-ray as needed -As needed bronchodilators -Mobilization as tolerated -Would like to see her up in chair if possible -I think we should hold onto the ICU for least another day for close observation -If continue to improve may be consider transfer from the ICU tomorrow.  Hx of sarcoidosis. -Outpatient follow-up not active issue  Steroid induced hyperglycemia. -SSI  Hx of hypothyroidism. -Synthroid  Hx of HLD. -Pravastatin  Anticoagulation. -Continue fondaparinux  Best practice:  Diet: Regular DVT prophylaxis: Fondaparinux GI prophylaxis: protonix Mobility: Up in chair Code Status: full code Disposition: ICU  Labs    CMP Latest Ref Rng & Units 12/15/2018 12/14/2018 12/13/2018  Glucose 70 - 99 mg/dL 183(H) 172(H) 155(H)  BUN 8 - 23 mg/dL 35(H) 37(H) 27(H)  Creatinine 0.44 - 1.00 mg/dL 0.95 0.94 0.80  Sodium 135 -  145 mmol/L 136 137 139  Potassium 3.5 - 5.1 mmol/L 4.7 4.7 4.3  Chloride 98 - 111 mmol/L 100 101 106  CO2 22 - 32 mmol/L 27 26 24   Calcium 8.9 - 10.3 mg/dL 8.5(L) 8.5(L) 8.3(L)  Total Protein 6.5 - 8.1 g/dL - 6.4(L) 5.6(L)  Total Bilirubin 0.3 - 1.2 mg/dL - 0.6 0.5  Alkaline Phos 38 - 126 U/L - 61 55  AST 15 - 41 U/L - 62(H) 43(H)  ALT 0 - 44 U/L - 123(H)  58(H)    CBC Latest Ref Rng & Units 12/16/2018 12/15/2018 12/14/2018  WBC 4.0 - 10.5 K/uL 14.0(H) 12.4(H) 6.5  Hemoglobin 12.0 - 15.0 g/dL 14.4 14.4 15.0  Hematocrit 36.0 - 46.0 % 43.1 43.6 44.8  Platelets 150 - 400 K/uL 282 248 241    CBG (last 3)  Recent Labs    12/15/18 1224 12/15/18 2126  GLUCAP 201* 229*        Component Value Date/Time   CRP 0.9 12/15/2018 0600   CRP 2.0 (H) 12/14/2018 0429   CRP 4.1 (H) 12/13/2018 0049     Garner Nash, DO [facility] 12/16/2018 7:56 AM

## 2018-12-17 DIAGNOSIS — D869 Sarcoidosis, unspecified: Secondary | ICD-10-CM | POA: Diagnosis not present

## 2018-12-17 DIAGNOSIS — E039 Hypothyroidism, unspecified: Secondary | ICD-10-CM

## 2018-12-17 DIAGNOSIS — J9601 Acute respiratory failure with hypoxia: Secondary | ICD-10-CM | POA: Diagnosis not present

## 2018-12-17 DIAGNOSIS — U071 COVID-19: Secondary | ICD-10-CM | POA: Diagnosis not present

## 2018-12-17 LAB — GLUCOSE, CAPILLARY
Glucose-Capillary: 228 mg/dL — ABNORMAL HIGH (ref 70–99)
Glucose-Capillary: 196 mg/dL — ABNORMAL HIGH (ref 70–99)
Glucose-Capillary: 303 mg/dL — ABNORMAL HIGH (ref 70–99)
Glucose-Capillary: 239 mg/dL — ABNORMAL HIGH (ref 70–99)
Glucose-Capillary: 297 mg/dL — ABNORMAL HIGH (ref 70–99)

## 2018-12-17 LAB — CBC
HCT: 39.2 % (ref 36.0–46.0)
Hemoglobin: 13.4 g/dL (ref 12.0–15.0)
MCH: 30.7 pg (ref 26.0–34.0)
MCHC: 34.2 g/dL (ref 30.0–36.0)
MCV: 89.7 fL (ref 80.0–100.0)
Platelets: 249 10*3/uL (ref 150–400)
RBC: 4.37 MIL/uL (ref 3.87–5.11)
RDW: 12.2 % (ref 11.5–15.5)
WBC: 14.2 10*3/uL — ABNORMAL HIGH (ref 4.0–10.5)
nRBC: 0 % (ref 0.0–0.2)

## 2018-12-17 LAB — BASIC METABOLIC PANEL
Anion gap: 8 (ref 5–15)
BUN: 36 mg/dL — ABNORMAL HIGH (ref 8–23)
CO2: 26 mmol/L (ref 22–32)
Calcium: 8.2 mg/dL — ABNORMAL LOW (ref 8.9–10.3)
Chloride: 102 mmol/L (ref 98–111)
Creatinine, Ser: 0.84 mg/dL (ref 0.44–1.00)
GFR calc Af Amer: >60 mL/min (ref >60)
GFR calc non Af Amer: >60 mL/min (ref >60)
Glucose, Bld: 206 mg/dL — ABNORMAL HIGH (ref 70–99)
Potassium: 4.7 mmol/L (ref 3.5–5.1)
Sodium: 136 mmol/L (ref 135–145)

## 2018-12-17 LAB — LACTATE DEHYDROGENASE: LDH: 284 U/L — ABNORMAL HIGH (ref 98–192)

## 2018-12-17 LAB — MAGNESIUM: Magnesium: 2.4 mg/dL (ref 1.7–2.4)

## 2018-12-17 LAB — FERRITIN: Ferritin: 177 ng/mL (ref 11–307)

## 2018-12-17 LAB — D-DIMER, QUANTITATIVE: D-Dimer, Quant: 0.99 ug/mL-FEU — ABNORMAL HIGH (ref 0.00–0.50)

## 2018-12-17 LAB — C-REACTIVE PROTEIN: CRP: <0.8 mg/dL (ref <1.0)

## 2018-12-17 MED ORDER — FUROSEMIDE 10 MG/ML IJ SOLN
40.0000 mg | Freq: Three times a day (TID) | INTRAMUSCULAR | Status: AC
  Administered 2018-12-17 (×2): 40 mg via INTRAVENOUS
  Filled 2018-12-17 (×2): qty 4

## 2018-12-17 NOTE — Progress Notes (Signed)
Patient's yellow earring was removed from her right ear and she requested to place it in her wallet which is at the bedside. She refused to have her belongings locked up with security at this time. I educated her on our policy for missing items. She was aware and had no questions regarding that.

## 2018-12-17 NOTE — Progress Notes (Signed)
Results for RUCHIKA, ESHBAUGH (MRN [id number]) as of 12/17/2018 12:22  Ref. Range 12/16/2018 12:23 12/16/2018 16:55 12/16/2018 20:48 12/17/2018 08:05 12/17/2018 11:49  Glucose-Capillary Latest Ref Range: 70 - 99 mg/dL 235 (H) 229 (H) 272 (H) 196 (H) 303 (H)  Noted that postprandial blood sugars are greater than 180 mg/dl.  Recommend adding Novolog 4 units TID with meals if postprandial blood sugars continue to be elevated.   Harvel Ricks RN BSN CDE Diabetes Coordinator Pager: [phone]  8am-5pm

## 2018-12-17 NOTE — Plan of Care (Signed)
Patient Summary: No adverse events throughout the night. No SOB or acute distress noted. No c/o pain reported from patient. Patient stable. VSS. See MAR re: medications administered. See flowsheet re: assessment. Patient remains on HFNC and NRB intermittent to maintain sat> 85% per MD plan of care. Bed in locked low position with call light in reach. Plan of care continued.  Problem: Education: Goal: Knowledge of risk factors and measures for prevention of condition will improve Outcome: Progressing   Problem: Coping: Goal: Psychosocial and spiritual needs will be supported Outcome: Progressing   Problem: Respiratory: Goal: Will maintain a patent airway Outcome: Progressing Goal: Complications related to the disease process, condition or treatment will be avoided or minimized Outcome: Progressing   Problem: Education: Goal: Knowledge of General Education information will improve Description: Including pain rating scale, medication(s)/side effects and non-pharmacologic comfort measures Outcome: Progressing   Problem: Health Behavior/Discharge Planning: Goal: Ability to manage health-related needs will improve Outcome: Progressing   Problem: Clinical Measurements: Goal: Ability to maintain clinical measurements within normal limits will improve Outcome: Progressing Goal: Will remain free from infection Outcome: Progressing Goal: Diagnostic test results will improve Outcome: Progressing Goal: Respiratory complications will improve Outcome: Progressing Goal: Cardiovascular complication will be avoided Outcome: Progressing   Problem: Activity: Goal: Risk for activity intolerance will decrease Outcome: Progressing   Problem: Nutrition: Goal: Adequate nutrition will be maintained Outcome: Progressing   Problem: Coping: Goal: Level of anxiety will decrease Outcome: Progressing   Problem: Elimination: Goal: Will not experience complications related to bowel motility Outcome:  Progressing Goal: Will not experience complications related to urinary retention Outcome: Progressing   Problem: Pain Managment: Goal: General experience of comfort will improve Outcome: Progressing   Problem: Safety: Goal: Ability to remain free from injury will improve Outcome: Progressing   Problem: Skin Integrity: Goal: Risk for impaired skin integrity will decrease Outcome: Progressing

## 2018-12-17 NOTE — Progress Notes (Signed)
NAME:  Kim Harrison, MRN:  [id number], DOB:  [birth year], LOS: 7 ADMISSION DATE:  12/18/2018, CONSULTATION DATE:  12/14/2018 REFERRING MD:  Dr. Tamela Gammon, [facility], CHIEF COMPLAINT:  Short of breath   Brief History   63 yo female former smoker developed fever and chills at end of October 2020.  She tested positive for COVID 19 on 12/06/18 and prescribed ABx and steroids as outpt.  She presented to ER in [place] on 12/28/2018 with worsening dyspnea and cough.  Found to have hypoxia and b/l pulmonary infiltrates consistent with COVID 19 pneumonia.  Hypoxia progressed and transferred to ICU 12/14/18.  Past Medical History  Sarcoidosis, Cervical cancer, Hypothyroidism, [place] Events   11/09 Admit 11/14 Transfer to ICU  Consults:  pccm  Procedures:    Significant Diagnostic Tests:  CT angio chest 11/09 >> LN up to 1.3 cm, mild centrilobular emphysema, b/l GGO with interlobular septal thickening CT angio chest 11/14 >> progression of ASD, no PE  Micro Data:  SARS VoV2 11/06 >> Positive  COVID Therapy:  Steroids 11/09 >> Remdesivir 11/09 >> 11/14 Convalescent plasma 11/11 Tocilizumab 11/11 Tocilizumab 11/13  Antimicrobials:    Interim history/subjective:   No events overnight  Objective   Blood pressure 97/70, pulse 62, temperature 97.7 F (36.5 C), temperature source Axillary, resp. rate (!) 21, height 5\' 5"  (1.651 m), weight 63 kg, SpO2 96 %.    FiO2 (%):  [70 %-80 %] 80 %   Intake/Output Summary (Last 24 hours) at 12/17/2018 0851 Last data filed at 12/16/2018 2100 Gross per 24 hour  Intake 100 ml  Output 450 ml  Net -350 ml   Filed Weights   12/28/2018 1930 12/15/18 0600 12/16/18 0500  Weight: 72.6 kg 62.9 kg 63 kg    Examination:  General Alert and oriented, well appearing HEENT: [place]/AT, PERRL, EOM-I and MMM, HFNC Cardiac RRR, Nl S1/S2 and -M/R/G Chest Diffuse crackles Abdomen -Soft, NT, ND and +BS Extremities: -edema and -tenderness Skin  Intact Neuro Alert and interactive, moving all ext to command Psych -normal mood  I reviewed chest CT myself, diffuse honey combing noted   Resolved Hospital Problem list     Assessment & Plan:   Acute hypoxic respiratory failure from COVID 19 pneumonia with worsening hypoxia 11/14. -Patient has completed remdesivir, tocilizumab x2 doses plus convalescent plasma -Day 9 of 10 for steroids -Continue to maintain SPO2 between 85 and 95% -Continue zinc plus vitamin C -CXR to PRN at this point -Hold in the ICU given O2 demand -As needed bronchodilators -Mobilization as tolerated -Would like to see her up in chair if possible -I think we should hold onto the ICU for least another day for close observation -Awake proning as tolerated  Hx of sarcoidosis. -Outpatient follow-up not active issue  Steroid induced hyperglycemia. -SSI  Hx of hypothyroidism. -Synthroid  Hx of HLD. -Pravastatin  Anticoagulation. -Continue fondaparinux  Transfer care to [facility] in AM with PCCM available as needed  Best practice:  Diet: Regular DVT prophylaxis: Fondaparinux GI prophylaxis: protonix Mobility: Up in chair Code Status: full code Disposition: ICU  Labs    CMP Latest Ref Rng & Units 12/16/2018 12/15/2018 12/14/2018  Glucose 70 - 99 mg/dL 175(H) 183(H) 172(H)  BUN 8 - 23 mg/dL 37(H) 35(H) 37(H)  Creatinine 0.44 - 1.00 mg/dL 0.92 0.95 0.94  Sodium 135 - 145 mmol/L 138 136 137  Potassium 3.5 - 5.1 mmol/L 5.2(H) 4.7 4.7  Chloride 98 - 111 mmol/L 101 100 101  CO2 22 - 32 mmol/L 29 27 26   Calcium 8.9 - 10.3 mg/dL 8.6(L) 8.5(L) 8.5(L)  Total Protein 6.5 - 8.1 g/dL - - 6.4(L)  Total Bilirubin 0.3 - 1.2 mg/dL - - 0.6  Alkaline Phos 38 - 126 U/L - - 61  AST 15 - 41 U/L - - 62(H)  ALT 0 - 44 U/L - - 123(H)    CBC Latest Ref Rng & Units 12/17/2018 12/16/2018 12/15/2018  WBC 4.0 - 10.5 K/uL 14.2(H) 14.0(H) 12.4(H)  Hemoglobin 12.0 - 15.0 g/dL 13.4 14.4 14.4  Hematocrit 36.0 - 46.0 % 39.2  43.1 43.6  Platelets 150 - 400 K/uL 249 282 248    CBG (last 3)  Recent Labs    12/16/18 1655 12/16/18 2048 12/17/18 0805  GLUCAP 229* 272* 196*        Component Value Date/Time   CRP <0.8 12/16/2018 0520   CRP 0.9 12/15/2018 0600   CRP 2.0 (H) 12/14/2018 0429   The patient is critically ill with multiple organ systems failure and requires high complexity decision making for assessment and support, frequent evaluation and titration of therapies, application of advanced monitoring technologies and extensive interpretation of multiple databases.   Critical Care Time devoted to patient care services described in this note is  31  Minutes. This time reflects time of care of this signee Dr Jennet Maduro. This critical care time does not reflect procedure time, or teaching time or supervisory time of PA/NP/Med student/Med Resident etc but could involve care discussion time.  Rush Farmer, M.D. [facility] Pulmonary/Critical Care Medicine.

## 2018-12-17 NOTE — Progress Notes (Signed)
Physical Therapy Treatment Patient Details Name: Kim Harrison MRN: [id number] DOB: [birth year] Today's Date: 12/17/2018    History of Present Illness 63 y/o female pt w/ hx of sarcoidosis, cervical cancer, presented to hospital with c/o SOB, hypothyroidism, HLD, fever and chills on 11/01 was treated and dx home with family, 11/06 sx returned and she returned to hospital when she was dx with COVID. Pt admitted to [facility] 11/10 with ARF w/ hypoxia, PNA sec to COVID 19, hypothyroidism and sarcoidosis.    PT Comments    Pt making progress with mobility today, was able to complete bed mob with SBA and cues after line management and set up. Was able to sit EOB unsupported, but gets very apprehensive and hence 02 saturation fluctuates. Pt was able to stand x 2 initially then able to complete sit<>stand x 5 with HHA and min guard-min a. General comments (skin integrity, edema, etc.): Pt on heated HFNC Fi02 90 flow 32, pt requests to place NRB on to complete EOB and sit<>stand tasks. sats fluctuate between 90s to 60s, initially pedi earlobe probe in place and plenth not uniform, placed on finger probe and pleth much more uniform and sats in 80s and increasing.    Follow Up Recommendations  Home health PT     Equipment Recommendations  None recommended by PT    Recommendations for Other Services       Precautions / Restrictions Precautions Precautions: Fall Precaution Comments: 02 desats w/ activity Restrictions Weight Bearing Restrictions: No    Mobility  Bed Mobility Overal bed mobility: Needs Assistance Bed Mobility: Supine to Sit;Sit to Supine;Rolling Rolling: Supervision   Supine to sit: Supervision Sit to supine: Supervision   General bed mobility comments: once set up and lines managed  Transfers Overall transfer level: Needs assistance Equipment used: 1 person hand held assist Transfers: Sit to/from Stand Sit to Stand: Min guard         General transfer comment:  completed sit<>stand x 5 at EOB  Ambulation/Gait                 Stairs             Wheelchair Mobility    Modified Rankin (Stroke Patients Only)       Balance Overall balance assessment: Needs assistance Sitting-balance support: Feet unsupported Sitting balance-Leahy Scale: Fair     Standing balance support: Single extremity supported Standing balance-Leahy Scale: Fair                              Cognition Arousal/Alertness: Awake/alert Behavior During Therapy: WFL for tasks assessed/performed;Anxious Overall Cognitive Status: Within Functional Limits for tasks assessed                                 General Comments: requires cues for relaxation strategies      Exercises      General Comments General comments (skin integrity, edema, etc.): Pt on heated HFNC Fi02 90 flow 32, pt requests to place NRB on to complete EOB and sit<>stand tasks. sats fluctuate between 90s to 60s, initially pedi earlobe probe in place and plenth not uniform, placed on finger probe and pleth much more uniform and sats in 80s and increasing.       Pertinent Vitals/Pain Pain Assessment: No/denies pain Pain Location: denies pain but states feels shaky    Home Living  Prior Function            PT Goals (current goals can now be found in the care plan section) Acute Rehab PT Goals Patient Stated Goal: feels better than last week PT Goal Formulation: With patient Time For Goal Achievement: 12/25/18 Potential to Achieve Goals: Fair Progress towards PT goals: Progressing toward goals    Frequency    Min 3X/week      PT Plan Current plan remains appropriate    Co-evaluation              AM-PAC PT "6 Clicks" Mobility   Outcome Measure  Help needed turning from your back to your side while in a flat bed without using bedrails?: None Help needed moving from lying on your back to sitting on the side of a  flat bed without using bedrails?: None Help needed moving to and from a bed to a chair (including a wheelchair)?: A Little Help needed standing up from a chair using your arms (e.g., wheelchair or bedside chair)?: A Little Help needed to walk in hospital room?: A Lot Help needed climbing 3-5 steps with a railing? : A Lot 6 Click Score: 18    End of Session Equipment Utilized During Treatment: Oxygen Activity Tolerance: Treatment limited secondary to medical complications (Comment);Other (comment)(apprehension with activity) Patient left: in bed;with nursing/sitter in room Nurse Communication: Mobility status PT Visit Diagnosis: Other abnormalities of gait and mobility (R26.89);Muscle weakness (generalized) (M62.81)     Time: [id number] PT Time Calculation (min) (ACUTE ONLY): 30 min  Charges:  $Therapeutic Activity: 23-37 mins                     Horald Chestnut, PT    Delford Field 12/17/2018, 4:23 PM

## 2018-12-17 NOTE — Progress Notes (Signed)
Spoke with and updated patient's husband. I was able to answer any questions he had. Patient also has her cell phone at the bedside and has been updating other family members.

## 2018-12-18 LAB — BASIC METABOLIC PANEL
Anion gap: 12 (ref 5–15)
BUN: 43 mg/dL — ABNORMAL HIGH (ref 8–23)
CO2: 31 mmol/L (ref 22–32)
Calcium: 8.6 mg/dL — ABNORMAL LOW (ref 8.9–10.3)
Chloride: 92 mmol/L — ABNORMAL LOW (ref 98–111)
Creatinine, Ser: 1.06 mg/dL — ABNORMAL HIGH (ref 0.44–1.00)
GFR calc Af Amer: >60 mL/min (ref >60)
GFR calc non Af Amer: 55 mL/min — ABNORMAL LOW (ref >60)
Glucose, Bld: 260 mg/dL — ABNORMAL HIGH (ref 70–99)
Potassium: 4.3 mmol/L (ref 3.5–5.1)
Sodium: 135 mmol/L (ref 135–145)

## 2018-12-18 LAB — CBC
HCT: 44.1 % (ref 36.0–46.0)
Hemoglobin: 15.1 g/dL — ABNORMAL HIGH (ref 12.0–15.0)
MCH: 30.4 pg (ref 26.0–34.0)
MCHC: 34.2 g/dL (ref 30.0–36.0)
MCV: 88.9 fL (ref 80.0–100.0)
Platelets: 271 10*3/uL (ref 150–400)
RBC: 4.96 MIL/uL (ref 3.87–5.11)
RDW: 12.4 % (ref 11.5–15.5)
WBC: 15.3 10*3/uL — ABNORMAL HIGH (ref 4.0–10.5)
nRBC: 0 % (ref 0.0–0.2)

## 2018-12-18 LAB — LACTATE DEHYDROGENASE: LDH: 339 U/L — ABNORMAL HIGH (ref 98–192)

## 2018-12-18 LAB — GLUCOSE, CAPILLARY
Glucose-Capillary: 222 mg/dL — ABNORMAL HIGH (ref 70–99)
Glucose-Capillary: 382 mg/dL — ABNORMAL HIGH (ref 70–99)
Glucose-Capillary: 276 mg/dL — ABNORMAL HIGH (ref 70–99)
Glucose-Capillary: 159 mg/dL — ABNORMAL HIGH (ref 70–99)

## 2018-12-18 LAB — MAGNESIUM: Magnesium: 2.6 mg/dL — ABNORMAL HIGH (ref 1.7–2.4)

## 2018-12-18 LAB — D-DIMER, QUANTITATIVE: D-Dimer, Quant: 1.04 ug/mL-FEU — ABNORMAL HIGH (ref 0.00–0.50)

## 2018-12-18 LAB — FERRITIN: Ferritin: 149 ng/mL (ref 11–307)

## 2018-12-18 LAB — C-REACTIVE PROTEIN: CRP: <0.8 mg/dL (ref <1.0)

## 2018-12-18 LAB — PHOSPHORUS: Phosphorus: 4.4 mg/dL (ref 2.5–4.6)

## 2018-12-18 MED ORDER — FUROSEMIDE 10 MG/ML IJ SOLN
40.0000 mg | Freq: Once | INTRAMUSCULAR | Status: AC
  Administered 2018-12-18: 40 mg via INTRAVENOUS
  Filled 2018-12-18: qty 4

## 2018-12-18 MED ORDER — METHYLPREDNISOLONE SODIUM SUCC 40 MG IJ SOLR
40.0000 mg | Freq: Two times a day (BID) | INTRAMUSCULAR | Status: DC
  Administered 2018-12-18 – 2018-12-19 (×2): 40 mg via INTRAVENOUS
  Filled 2018-12-18 (×2): qty 1

## 2018-12-18 NOTE — Progress Notes (Signed)
PROGRESS NOTE                                                                                                                                                                                                             Patient Demographics:    Kim Harrison, is a 63 y.o. female, DOB - 07/12/1955, [id number]  Outpatient Primary MD for the patient is Lou Miner, MD   Admit date - 12/08/2018   LOS - 8  Chief Complaint  Patient presents with   Shortness of Breath   Cough       Brief Narrative: Patient is a 63 y.o. female with PMHx of sarcoidosis, hypothyroidism, dyslipidemia-presented with fever, shortness of breath-found to have acute hypoxic respiratory failure.  Hospital course complicated by worsening hypoxemia in spite of treatment for COVID-19.  See below for further details   Subjective:    [name] today feels better-she was transitioned from heated high flow to regular high flow.  She feels less short of breath compared to the past few days.   Assessment  & Plan :   Acute Hypoxic Resp Failure due to Covid 19 Viral pneumonia: Stable-FiO2 requirements slowly coming down.  Has completed a course of remdesivir-continue tapering steroids.  If she is stable on high flow-can possibly transfer to PCU later today or tomorrow.  Fever: afebrile  O2 requirements:  SpO2: 92 % O2 Flow Rate (L/min): 25 L/min FiO2 (%): 90 %   COVID-19 Labs: Recent Labs    12/16/18 0520 12/17/18 0715 12/18/18 0532  DDIMER 1.08* 0.99* 1.04*  FERRITIN 338* 177 149  LDH 430* 284* 339*  CRP <0.8 <0.8 <0.8    No results found for: SARSCOV2NAA   COVID-19 Medications: Steroids:11/9>> Remdesivir:11/9>>11/14 Actemra: x 2 on 11/11 and 11/13 Convalescent Plasma:x 1 on 11/11  Other medications: Diuretics:Euvolemic-Lasix x1 today to maintain negative balance. Antibiotics:Not needed as no evidence of bacterial  infection  Prone/Incentive Spirometry: encouraged patient to lie prone for 3-4 hours at a time for a total of 16 hours a day, and to encourage incentive spirometry use 3-4/hour.  DVT Prophylaxis  :  Fondaparinux  Hypothyroidism:Synthroid  Hyperlipidemia:Pravachol  History of sarcoidosis: Not on chronic steroids at home  GI prophylaxis: PPI  Consults  :  PCCM  ABG: No results found for: PHART, PCO2ART, PO2ART, HCO3, TCO2, ACIDBASEDEF, O2SAT  Vent Settings: N/A  Condition - Guarded  Family Communication  :  Spouse updated over the phone  Code Status :  Full Code  Diet :  Diet Order            Diet regular Room service appropriate? Yes; Fluid consistency: Thin  Diet effective now               Disposition Plan  :  Remain hospitalized  Barriers to discharge: Hypoxia requiring O2 supplementation/complete 5 days of IV Remdesivir  Antimicorbials  :    Anti-infectives (From admission, onward)   Start     Dose/Rate Route Frequency Ordered Stop   12/11/18 1000  remdesivir 100 mg in sodium chloride 0.9 % 250 mL IVPB     100 mg 500 mL/hr over 30 Minutes Intravenous Every 24 hours 12/10/18 0224 12/14/18 1120   12/10/18 0300  remdesivir 200 mg in sodium chloride 0.9 % 250 mL IVPB     200 mg 500 mL/hr over 30 Minutes Intravenous Once 12/10/18 0224 12/10/18 [id number]      Inpatient Medications  Scheduled Meds:  Chlorhexidine Gluconate Cloth  6 each Topical Daily   cholecalciferol  1,000 Units Oral Daily   feeding supplement (ENSURE ENLIVE)  237 mL Oral TID WC   fondaparinux (ARIXTRA) injection  2.5 mg Subcutaneous Q0600   insulin aspart  0-20 Units Subcutaneous TID WC   insulin aspart  0-5 Units Subcutaneous QHS   levothyroxine  75 mcg Oral Daily   mouth rinse  15 mL Mouth Rinse BID   methylPREDNISolone (SOLU-MEDROL) injection  60 mg Intravenous Q8H   pantoprazole  40 mg Oral Daily   vitamin C  500 mg Oral Daily   zinc sulfate  220 mg Oral Daily    Continuous Infusions: PRN Meds:.benzonatate, chlorpheniramine-HYDROcodone, guaiFENesin-dextromethorphan, Ipratropium-Albuterol   Time Spent in minutes  35   See all Orders from today for further details  Oren Binet M.D on 12/18/2018 at 8:28 AM  To page go to [email or web address] - use universal password  [facility] -  Office  [phone]    Objective:   Vitals:   12/18/18 0400 12/18/18 0500 12/18/18 0600 12/18/18 0700  BP: 123/81 124/78 97/73   Pulse: 68 62 76   Resp: 17 18 (!) 25   Temp: 99.2 F (37.3 C)     TempSrc: Oral     SpO2: 94% 92% 92%   Weight:    60.6 kg  Height:        Wt Readings from Last 3 Encounters:  12/18/18 60.6 kg  07/23/15 70.3 kg  02/04/14 76.1 kg     Intake/Output Summary (Last 24 hours) at 12/18/2018 0828 Last data filed at 12/18/2018 0600 Gross per 24 hour  Intake 1415 ml  Output 3250 ml  Net -1835 ml     Physical Exam Gen Exam:Alert awake-not in any distress HEENT:atraumatic, normocephalic Chest: B/L clear to auscultation anteriorly CVS:S1S2 regular Abdomen:soft non tender, non distended Extremities:no edema Neurology: Non focal Skin: no rash   Data Review:    CBC Recent Labs  Lab 12/14/18 0429 12/15/18 0600 12/16/18 0520 12/17/18 0715 12/18/18 0532  WBC 6.5 12.4* 14.0* 14.2* 15.3*  HGB 15.0 14.4 14.4 13.4 15.1*  HCT 44.8 43.6 43.1 39.2 44.1  PLT 241 248 282 249 271  MCV 92.0 91.0 90.5 89.7 88.9  MCH 30.8 30.1 30.3 30.7 30.4  MCHC 33.5 33.0 33.4 34.2 34.2  RDW 12.3 12.2 12.1 12.2 12.4    Chemistries  Recent Labs  Lab 12/12/18 0110 12/13/18 0049 12/14/18 0429 12/15/18 0600 12/16/18 0520 12/17/18 0715 12/18/18 0532  NA 132* 139 137 136 138 136 135  K 4.1 4.3 4.7 4.7 5.2* 4.7 4.3  CL 99 106 101 100 101 102 92*  CO2 23 24 26 27 29 26 31   GLUCOSE 147* 155* 172* 183* 175* 206* 260*  BUN 28* 27* 37* 35* 37* 36* 43*  CREATININE 0.90 0.80 0.94 0.95 0.92 0.84 1.06*  CALCIUM 8.1* 8.3* 8.5* 8.5* 8.6*  8.2* 8.6*  MG 2.1 2.4 2.5* 2.6* 2.5* 2.4 2.6*  AST 29 43* 62*  --   --   --   --   ALT 38 58* 123*  --   --   --   --   ALKPHOS 58 55 61  --   --   --   --   BILITOT 0.6 0.5 0.6  --   --   --   --    ------------------------------------------------------------------------------------------------------------------ No results for input(s): CHOL, HDL, LDLCALC, TRIG, CHOLHDL, LDLDIRECT in the last 72 hours.  Lab Results  Component Value Date   HGBA1C 6.2 (H) 12/15/2018   ------------------------------------------------------------------------------------------------------------------ No results for input(s): TSH, T4TOTAL, T3FREE, THYROIDAB in the last 72 hours.  Invalid input(s): FREET3 ------------------------------------------------------------------------------------------------------------------ Recent Labs    12/17/18 0715 12/18/18 0532  FERRITIN 177 149    Coagulation profile No results for input(s): INR, PROTIME in the last 168 hours.  Recent Labs    12/17/18 0715 12/18/18 0532  DDIMER 0.99* 1.04*    Cardiac Enzymes No results for input(s): CKMB, TROPONINI, MYOGLOBIN in the last 168 hours.  Invalid input(s): CK ------------------------------------------------------------------------------------------------------------------    Component Value Date/Time   BNP 60.6 12/15/2018 0600    Micro Results Recent Results (from the past 240 hour(s))  MRSA PCR Screening     Status: None   Collection Time: 12/14/18  9:50 PM   Specimen: Nasal Mucosa; Nasopharyngeal  Result Value Ref Range Status   MRSA by PCR NEGATIVE NEGATIVE Final    Comment:        The GeneXpert MRSA Assay (FDA approved for NASAL specimens only), is one component of a comprehensive MRSA colonization surveillance program. It is not intended to diagnose MRSA infection nor to guide or monitor treatment for MRSA infections. Performed at [facility], [place] [address]  [place], [place] [address]     Radiology Reports Ct Angio Chest Pe W Or Wo Contrast  Result Date: 12/14/2018 CLINICAL DATA:  Chest pain. EXAM: CT ANGIOGRAPHY CHEST WITH CONTRAST TECHNIQUE: Multidetector CT imaging of the chest was performed using the standard protocol during bolus administration of intravenous contrast. Multiplanar CT image reconstructions and MIPs were obtained to evaluate the vascular anatomy. CONTRAST:  30mL OMNIPAQUE IOHEXOL 350 MG/ML SOLN COMPARISON:  12/26/2018 FINDINGS: Cardiovascular:  Cardiovascular And no sign of acute pulmonary embolism. Mediastinum/Nodes: Enlarged lymph nodes throughout the mediastinum. (Image 52, series 5) 1.3 cm right paratracheal lymph node previously as large as 1.6 cm. 1.2 cm right paratracheal lymph node (image 41, series 5) previously 1.2 cm. (Image 66, series 5): Stable 1.3 cm right subcarinal lymph node with other smaller subcarinal lymph nodes. Mild enlargement of perihilar lymph nodes.) Lungs/Pleura: Septal thickening and ground-glass is moderately is worse than on the prior exam particularly in the right upper lobe inferiorly as well as the extent of disease overall but particularly tracking in the left upper lobe. A. Small nodular focus has developed in the left upper lobe since  the prior study. No signs of dense consolidation. Airways are patent. Upper Abdomen: Small hiatal hernia. No signs of acute process in the upper abdomen. Post cholecystectomy. Musculoskeletal: No acute bone finding or destructive bone process. Review of the MIP images confirms the above findings. IMPRESSION: Moderate is worsening of ground-glass and septal thickening since the prior exam could represent viral pneumonia or asymmetric edema superimposed on changes of sarcoidosis. Small nodular focus in the left mid chest is likely inflammatory and new since prior exam. No signs of acute pulmonary embolism. Aortic Atherosclerosis (ICD10-I70.0). Electronically Signed   By: Zetta Bills M.D.   On: 12/14/2018 12:29   Ct Angio Chest Pe W And/or Wo Contrast  Result Date: 12/20/2018 CLINICAL DATA:  Shortness of breath, covid positive EXAM: CT ANGIOGRAPHY CHEST WITH CONTRAST TECHNIQUE: Multidetector CT imaging of the chest was performed using the standard protocol during bolus administration of intravenous contrast. Multiplanar CT image reconstructions and MIPs were obtained to evaluate the vascular anatomy. CONTRAST:  50mL OMNIPAQUE IOHEXOL 350 MG/ML SOLN COMPARISON:  None. FINDINGS: Cardiovascular: There is a optimal opacification of the pulmonary arteries. There is no central,segmental, or subsegmental filling defects within the pulmonary arteries. The heart is normal in size. No pericardial effusion or thickening. No evidence right heart strain. There is normal three-vessel brachiocephalic anatomy without proximal stenosis. Minimal aortic arch calcifications are seen. Mediastinum/Nodes: Scattered pretracheal, subcarinal, and right hilar lymph nodes are noted the largest measuring 1.3 cm in the subcarinal region. Thyroid gland, trachea, and esophagus demonstrate no significant findings. Lungs/Pleura: Mild centrilobular emphysematous changes are seen at both lung apices. There is interlobular septal thickening with ground-glass opacities seen throughout both lungs, predominantly within the lower lobes. No pleural effusion is seen. No large airspace consolidation is seen. Upper Abdomen: No acute abnormalities present in the visualized portions of the upper abdomen. Musculoskeletal: No chest wall abnormality. No acute or significant osseous findings. Review of the MIP images confirms the above findings. IMPRESSION: No central, segmental, or subsegmental pulmonary embolism. Interlobular septal thickening and there are a spectrum of findings in the lungs which can be seen with acute viral atypical infection (as well as other non-infectious etiologies such as edema). Mild centrilobular  emphysematous changes in both lung apices. Electronically Signed   By: Prudencio Pair M.D.   On: 12/20/2018 22:09   Dg Chest Port 1 View  Result Date: 12/13/2018 CLINICAL DATA:  Dyspnea EXAM: PORTABLE CHEST 1 VIEW COMPARISON:  Portable exam 0556 hours compared to 12/02/2018 FINDINGS: Stable heart size mediastinal contours. Diffuse interstitial infiltrates bilaterally, slightly increased, consistent with pneumonia and history of COVID-19. No pleural effusion or pneumothorax. Bones unremarkable. IMPRESSION: Increased BILATERAL pulmonary infiltrates consistent with pneumonia. Electronically Signed   By: Lavonia Dana M.D.   On: 12/13/2018 07:58   Dg Chest Portable 1 View  Result Date: 12/13/2018 CLINICAL DATA:  Shortness of breath and COVID-19 positivity EXAM: PORTABLE CHEST 1 VIEW COMPARISON:  12/06/2018 FINDINGS: Cardiac shadow is stable. The lungs are well aerated bilaterally. Increasing interstitial markings are noted throughout the mid and lower lung fields bilaterally consistent with the patient's given clinical history of COVID-19 positivity. No focal confluent infiltrate is seen. No effusion is noted. No bony abnormality is seen. IMPRESSION: Increasing interstitial markings consistent with the patient's given clinical history of COVID-19 positivity. Electronically Signed   By: Inez Catalina M.D.   On: 12/04/2018 20:31

## 2018-12-18 NOTE — Progress Notes (Signed)
Initial Nutrition Assessment  RD working remotely.  DOCUMENTATION CODES:   Not applicable  INTERVENTION:    Continue Ensure Enlive po TID, each supplement provides 350 kcal and 20 grams of protein.  Pt receiving Hormel Shake daily with Breakfast which provides 520 kcals and 22 g of protein and Magic cup BID with lunch and dinner, each supplement provides 290 kcal and 9 grams of protein, automatically on meal trays to optimize nutritional intake.   Encourage intake of meals and supplements.  NUTRITION DIAGNOSIS:   Increased nutrient needs related to acute illness(COVID 19 PNA) as evidenced by estimated needs.  GOAL:   Patient will meet greater than or equal to 90% of their needs  MONITOR:   PO intake, Supplement acceptance, Labs  REASON FOR ASSESSMENT:   Malnutrition Screening Tool    ASSESSMENT:   63 yo female admitted with fever, chills, COVID 19 PNA (tested positive on 11/6). PMH includes sarcoidosis, cervical cancer, hypothyroidism, HLD.   Patient is on a regular diet consuming 50-85% of meals. She is also drinking Ensure Enlive supplements TID between meals.  Labs reviewed.  CBG's: 297-222  Medications reviewed and include vitamin D3, Novolog, solu-medrol, vitamin C, zinc sulfate.   NUTRITION - FOCUSED PHYSICAL EXAM:  deferred, working remotely  Diet Order:   Diet Order            Diet regular Room service appropriate? Yes; Fluid consistency: Thin  Diet effective now              EDUCATION NEEDS:   Not appropriate for education at this time  Skin:  Skin Assessment: Reviewed RN Assessment  Last BM:  11/17  Height:   Ht Readings from Last 1 Encounters:  12/13/2018 5\' 5"  (1.651 m)    Weight:   Wt Readings from Last 1 Encounters:  12/18/18 60.6 kg    Ideal Body Weight:  56.8 kg  BMI:  Body mass index is 22.23 kg/m.  Estimated Nutritional Needs:   Kcal:  1800-2000  Protein:  85-100 gm  Fluid:  >/= 1.8 L    Molli Barrows, RD,  LDN, [place] Pager [phone] After Hours Pager [phone]

## 2018-12-19 LAB — FERRITIN: Ferritin: 153 ng/mL (ref 11–307)

## 2018-12-19 LAB — BASIC METABOLIC PANEL
Anion gap: 14 (ref 5–15)
BUN: 46 mg/dL — ABNORMAL HIGH (ref 8–23)
CO2: 33 mmol/L — ABNORMAL HIGH (ref 22–32)
Calcium: 8.7 mg/dL — ABNORMAL LOW (ref 8.9–10.3)
Chloride: 88 mmol/L — ABNORMAL LOW (ref 98–111)
Creatinine, Ser: 1.17 mg/dL — ABNORMAL HIGH (ref 0.44–1.00)
GFR calc Af Amer: 57 mL/min — ABNORMAL LOW (ref >60)
GFR calc non Af Amer: 49 mL/min — ABNORMAL LOW (ref >60)
Glucose, Bld: 222 mg/dL — ABNORMAL HIGH (ref 70–99)
Potassium: 4 mmol/L (ref 3.5–5.1)
Sodium: 135 mmol/L (ref 135–145)

## 2018-12-19 LAB — CBC
HCT: 47.7 % — ABNORMAL HIGH (ref 36.0–46.0)
Hemoglobin: 16.3 g/dL — ABNORMAL HIGH (ref 12.0–15.0)
MCH: 30.8 pg (ref 26.0–34.0)
MCHC: 34.2 g/dL (ref 30.0–36.0)
MCV: 90 fL (ref 80.0–100.0)
Platelets: 280 10*3/uL (ref 150–400)
RBC: 5.3 MIL/uL — ABNORMAL HIGH (ref 3.87–5.11)
RDW: 12.6 % (ref 11.5–15.5)
WBC: 20.8 10*3/uL — ABNORMAL HIGH (ref 4.0–10.5)
nRBC: 0 % (ref 0.0–0.2)

## 2018-12-19 LAB — GLUCOSE, CAPILLARY
Glucose-Capillary: 215 mg/dL — ABNORMAL HIGH (ref 70–99)
Glucose-Capillary: 300 mg/dL — ABNORMAL HIGH (ref 70–99)
Glucose-Capillary: 235 mg/dL — ABNORMAL HIGH (ref 70–99)
Glucose-Capillary: 262 mg/dL — ABNORMAL HIGH (ref 70–99)

## 2018-12-19 LAB — MAGNESIUM: Magnesium: 2.8 mg/dL — ABNORMAL HIGH (ref 1.7–2.4)

## 2018-12-19 LAB — D-DIMER, QUANTITATIVE: D-Dimer, Quant: 0.97 ug/mL-FEU — ABNORMAL HIGH (ref 0.00–0.50)

## 2018-12-19 LAB — LACTATE DEHYDROGENASE: LDH: 328 U/L — ABNORMAL HIGH (ref 98–192)

## 2018-12-19 LAB — C-REACTIVE PROTEIN: CRP: <0.8 mg/dL (ref <1.0)

## 2018-12-19 MED ORDER — FUROSEMIDE 10 MG/ML IJ SOLN
40.0000 mg | Freq: Once | INTRAMUSCULAR | Status: AC
  Administered 2018-12-19: 40 mg via INTRAVENOUS
  Filled 2018-12-19: qty 4

## 2018-12-19 MED ORDER — SALINE SPRAY 0.65 % NA SOLN
1.0000 | NASAL | Status: DC | PRN
  Administered 2018-12-22: 1 via NASAL
  Filled 2018-12-19: qty 44

## 2018-12-19 MED ORDER — CLONAZEPAM 0.5 MG PO TABS
0.5000 mg | ORAL_TABLET | Freq: Two times a day (BID) | ORAL | Status: DC | PRN
  Administered 2018-12-19 – 2018-12-22 (×2): 0.5 mg via ORAL
  Filled 2018-12-19 (×2): qty 1

## 2018-12-19 MED ORDER — METHYLPREDNISOLONE SODIUM SUCC 40 MG IJ SOLR
20.0000 mg | Freq: Two times a day (BID) | INTRAMUSCULAR | Status: DC
  Administered 2018-12-19 – 2018-12-22 (×6): 20 mg via INTRAVENOUS
  Filled 2018-12-19 (×6): qty 1

## 2018-12-19 NOTE — Progress Notes (Signed)
PT Cancellation Note  Patient Details Name: Kim Harrison MRN: [id number] DOB: [birth year]   Cancelled Treatment:    Reason Eval/Treat Not Completed: Medical issues which prohibited therapy, requiring more supplemental O2 this PM, SOB. Will check back tomorrow.   Neharika, Barboza 12/19/2018, 4:09 PM  [place] Pager [phone] Office [phone]

## 2018-12-19 NOTE — Progress Notes (Signed)
MD returned call, will plan to keep patient in ICU overnight for further observation of O2 requirements. And MD will place order for nasal spray for patient.

## 2018-12-19 NOTE — Progress Notes (Signed)
Patient desat to 78% after repositioning in bed on 10L HFNC. Patient unable to recover above 82% on 10L with deep breathing. HFNC increased to 15L with minimal effect, SpO2 mid 80s. NRB placed over 15L HFNC with increase in SpO2 to 100%. Desat again to 58s when NRB removed. Placed on 10L HFNC+NRB at this time with SpO2 90s. Will continue to monitor and wean O2 as tolerated.

## 2018-12-19 NOTE — Plan of Care (Signed)
Teaching continued with patient, all questions answered at this time. Emotional support provided to patient as needed. Remains on HFNC at 10L, weaning as tolerated. RT following. Discharge planning ongoing. Husband is currently +COVID, quarantining at home. VSS at this time. Afebrile. PT/OT following. Encouraging self T&R in bed. Pt desats with activity, but recovers quickly without intervention per RN report from previous shift. Encouraging PO intake, appetite fair. Ensure shakes TID. +Bs/+flatus, last BM 12/15/18. Vdg on bedpan. 40mg  IV lasix to be given x1 this AM. No c/o pain. Safe environment of care maintained. Skin intact. Will continue to monitor.

## 2018-12-19 NOTE — Progress Notes (Signed)
Patient continues to require higher level O2 support with HFNC now at 15L. Patient desats to mid 60%s with minimal movement in bed and requires NRB +15L HFNC to recover. SpO2 on 15L HFNC 89-94% at rest. 99-100% with NRB +15L HFNC. Pt also complaining about irritated nares d/t high flow O2. Dr. Sloan Leiter paged via [facility] and made aware of increasing O2 requirements and nasal irritation. Awaiting MD response at this time.

## 2018-12-19 NOTE — Progress Notes (Signed)
Called to update patient's husband Kim Harrison, no answer, voicemail left with return number to call if he would like an update from the RN.

## 2018-12-19 NOTE — Progress Notes (Signed)
PROGRESS NOTE                                                                                                                                                                                                             Patient Demographics:    Kim Harrison, is a 63 y.o. female, DOB - August 09, 1955, [id number]  Outpatient Primary MD for the patient is Lou Miner, MD   Admit date - 12/24/2018   LOS - 9  Chief Complaint  Patient presents with   Shortness of Breath   Cough       Brief Narrative: Patient is a 63 y.o. female with PMHx of sarcoidosis, hypothyroidism, dyslipidemia-presented with fever, shortness of breath-found to have acute hypoxic respiratory failure.  Hospital course complicated by worsening hypoxemia in spite of treatment for COVID-19.  See below for further details   Subjective:    [name] today feels  Much better-can now eat and talk without getting SOB-has been titrated down to 10L   Assessment  & Plan :   Acute Hypoxic Resp Failure due to Covid 19 Viral pneumonia: Clinical improving continues, less O2 requirement-on 10 L high flow. Continue to taper down steroids-should be ok to transfer to PCU today  Fever: afebrile  O2 requirements:  SpO2: 90 % O2 Flow Rate (L/min): 10 L/min FiO2 (%): 80 %   COVID-19 Labs: Recent Labs    12/17/18 0715 12/18/18 0532 12/19/18 0515  DDIMER 0.99* 1.04* 0.97*  FERRITIN 177 149 153  LDH 284* 339* 328*  CRP <0.8 <0.8 <0.8    No results found for: SARSCOV2NAA   COVID-19 Medications: Steroids:11/9>> Remdesivir:11/9>>11/14 Actemra: x 2 on 11/11 and 11/13 Convalescent Plasma:x 1 on 11/11  Other medications: Diuretics:Euvolemic-repeat Lasix x 1 to maintain neg balance Antibiotics:Not needed as no evidence of bacterial infection  Prone/Incentive Spirometry: encouraged patient to lie prone for 3-4 hours at a time for a total of 16  hours a day, and to encourage incentive spirometry use 3-4/hour.  DVT Prophylaxis  :  Fondaparinux  Hypothyroidism:continue synthroid  Hyperlipidemia:contiue pravachol  History of sarcoidosis: Not on chronic steroids at home  GI prophylaxis: PPI  Consults  :  PCCM  ABG: No results found for: PHART, PCO2ART, PO2ART, HCO3, TCO2, ACIDBASEDEF, O2SAT  Vent Settings: N/A  Condition - Guarded  Family Communication  :  Will  update spouse later  Code Status :  Full Code  Diet :  Diet Order            Diet regular Room service appropriate? Yes; Fluid consistency: Thin  Diet effective now               Disposition Plan  :  Remain hospitalized  Barriers to discharge: Hypoxia requiring O2 supplementation  Antimicorbials  :    Anti-infectives (From admission, onward)   Start     Dose/Rate Route Frequency Ordered Stop   12/11/18 1000  remdesivir 100 mg in sodium chloride 0.9 % 250 mL IVPB     100 mg 500 mL/hr over 30 Minutes Intravenous Every 24 hours 12/10/18 0224 12/14/18 1120   12/10/18 0300  remdesivir 200 mg in sodium chloride 0.9 % 250 mL IVPB     200 mg 500 mL/hr over 30 Minutes Intravenous Once 12/10/18 0224 12/10/18 [id number]      Inpatient Medications  Scheduled Meds:  Chlorhexidine Gluconate Cloth  6 each Topical Daily   cholecalciferol  1,000 Units Oral Daily   feeding supplement (ENSURE ENLIVE)  237 mL Oral TID WC   fondaparinux (ARIXTRA) injection  2.5 mg Subcutaneous Q0600   insulin aspart  0-20 Units Subcutaneous TID WC   insulin aspart  0-5 Units Subcutaneous QHS   levothyroxine  75 mcg Oral Daily   mouth rinse  15 mL Mouth Rinse BID   methylPREDNISolone (SOLU-MEDROL) injection  40 mg Intravenous Q12H   pantoprazole  40 mg Oral Daily   vitamin C  500 mg Oral Daily   zinc sulfate  220 mg Oral Daily   Continuous Infusions: PRN Meds:.benzonatate, chlorpheniramine-HYDROcodone, guaiFENesin-dextromethorphan, Ipratropium-Albuterol   Time  Spent in minutes  35   See all Orders from today for further details  Oren Binet M.D on 12/19/2018 at 7:46 AM  To page go to [email or web address] - use universal password  [facility] -  Office  [phone]    Objective:   Vitals:   12/18/18 2000 12/18/18 2100 12/19/18 0000 12/19/18 0700  BP: 112/70  101/70 (!) 104/49  Pulse: 92 90 80 88  Resp: (!) 22  19 20   Temp:   98 F (36.7 C)   TempSrc:   Oral   SpO2: (!) 86% 99% 96% 90%  Weight:      Height:        Wt Readings from Last 3 Encounters:  12/18/18 60.6 kg  07/23/15 70.3 kg  02/04/14 76.1 kg     Intake/Output Summary (Last 24 hours) at 12/19/2018 0746 Last data filed at 12/18/2018 2300 Gross per 24 hour  Intake --  Output 1275 ml  Net -1275 ml     Physical Exam Gen Exam:Alert awake-not in any distress HEENT:atraumatic, normocephalic Chest: B/L clear to auscultation anteriorly CVS:S1S2 regular Abdomen:soft non tender, non distended Extremities:no edema Neurology: Non focal Skin: no rash   Data Review:    CBC Recent Labs  Lab 12/15/18 0600 12/16/18 0520 12/17/18 0715 12/18/18 0532 12/19/18 0515  WBC 12.4* 14.0* 14.2* 15.3* 20.8*  HGB 14.4 14.4 13.4 15.1* 16.3*  HCT 43.6 43.1 39.2 44.1 47.7*  PLT 248 282 249 271 280  MCV 91.0 90.5 89.7 88.9 90.0  MCH 30.1 30.3 30.7 30.4 30.8  MCHC 33.0 33.4 34.2 34.2 34.2  RDW 12.2 12.1 12.2 12.4 12.6    Chemistries  Recent Labs  Lab 12/13/18 0049 12/14/18 0429 12/15/18 0600 12/16/18 0520 12/17/18 0715 12/18/18 0532 12/19/18 0515  NA  139 137 136 138 136 135 135  K 4.3 4.7 4.7 5.2* 4.7 4.3 4.0  CL 106 101 100 101 102 92* 88*  CO2 24 26 27 29 26 31  33*  GLUCOSE 155* 172* 183* 175* 206* 260* 222*  BUN 27* 37* 35* 37* 36* 43* 46*  CREATININE 0.80 0.94 0.95 0.92 0.84 1.06* 1.17*  CALCIUM 8.3* 8.5* 8.5* 8.6* 8.2* 8.6* 8.7*  MG 2.4 2.5* 2.6* 2.5* 2.4 2.6* 2.8*  AST 43* 62*  --   --   --   --   --   ALT 58* 123*  --   --   --   --   --    ALKPHOS 55 61  --   --   --   --   --   BILITOT 0.5 0.6  --   --   --   --   --    ------------------------------------------------------------------------------------------------------------------ No results for input(s): CHOL, HDL, LDLCALC, TRIG, CHOLHDL, LDLDIRECT in the last 72 hours.  Lab Results  Component Value Date   HGBA1C 6.2 (H) 12/15/2018   ------------------------------------------------------------------------------------------------------------------ No results for input(s): TSH, T4TOTAL, T3FREE, THYROIDAB in the last 72 hours.  Invalid input(s): FREET3 ------------------------------------------------------------------------------------------------------------------ Recent Labs    12/18/18 0532 12/19/18 0515  FERRITIN 149 153    Coagulation profile No results for input(s): INR, PROTIME in the last 168 hours.  Recent Labs    12/18/18 0532 12/19/18 0515  DDIMER 1.04* 0.97*    Cardiac Enzymes No results for input(s): CKMB, TROPONINI, MYOGLOBIN in the last 168 hours.  Invalid input(s): CK ------------------------------------------------------------------------------------------------------------------    Component Value Date/Time   BNP 60.6 12/15/2018 0600    Micro Results Recent Results (from the past 240 hour(s))  MRSA PCR Screening     Status: None   Collection Time: 12/14/18  9:50 PM   Specimen: Nasal Mucosa; Nasopharyngeal  Result Value Ref Range Status   MRSA by PCR NEGATIVE NEGATIVE Final    Comment:        The GeneXpert MRSA Assay (FDA approved for NASAL specimens only), is one component of a comprehensive MRSA colonization surveillance program. It is not intended to diagnose MRSA infection nor to guide or monitor treatment for MRSA infections. Performed at [facility], [place] [address] [place], [place] [address]     Radiology Reports Ct Angio Chest Pe W Or Wo Contrast  Result Date: 12/14/2018 CLINICAL DATA:   Chest pain. EXAM: CT ANGIOGRAPHY CHEST WITH CONTRAST TECHNIQUE: Multidetector CT imaging of the chest was performed using the standard protocol during bolus administration of intravenous contrast. Multiplanar CT image reconstructions and MIPs were obtained to evaluate the vascular anatomy. CONTRAST:  41mL OMNIPAQUE IOHEXOL 350 MG/ML SOLN COMPARISON:  12/02/2018 FINDINGS: Cardiovascular:  Cardiovascular And no sign of acute pulmonary embolism. Mediastinum/Nodes: Enlarged lymph nodes throughout the mediastinum. (Image 52, series 5) 1.3 cm right paratracheal lymph node previously as large as 1.6 cm. 1.2 cm right paratracheal lymph node (image 41, series 5) previously 1.2 cm. (Image 66, series 5): Stable 1.3 cm right subcarinal lymph node with other smaller subcarinal lymph nodes. Mild enlargement of perihilar lymph nodes.) Lungs/Pleura: Septal thickening and ground-glass is moderately is worse than on the prior exam particularly in the right upper lobe inferiorly as well as the extent of disease overall but particularly tracking in the left upper lobe. A. Small nodular focus has developed in the left upper lobe since the prior study. No signs of dense consolidation. Airways are patent. Upper  Abdomen: Small hiatal hernia. No signs of acute process in the upper abdomen. Post cholecystectomy. Musculoskeletal: No acute bone finding or destructive bone process. Review of the MIP images confirms the above findings. IMPRESSION: Moderate is worsening of ground-glass and septal thickening since the prior exam could represent viral pneumonia or asymmetric edema superimposed on changes of sarcoidosis. Small nodular focus in the left mid chest is likely inflammatory and new since prior exam. No signs of acute pulmonary embolism. Aortic Atherosclerosis (ICD10-I70.0). Electronically Signed   By: Zetta Bills M.D.   On: 12/14/2018 12:29   Ct Angio Chest Pe W And/or Wo Contrast  Result Date: 12/12/2018 CLINICAL DATA:  Shortness  of breath, covid positive EXAM: CT ANGIOGRAPHY CHEST WITH CONTRAST TECHNIQUE: Multidetector CT imaging of the chest was performed using the standard protocol during bolus administration of intravenous contrast. Multiplanar CT image reconstructions and MIPs were obtained to evaluate the vascular anatomy. CONTRAST:  56mL OMNIPAQUE IOHEXOL 350 MG/ML SOLN COMPARISON:  None. FINDINGS: Cardiovascular: There is a optimal opacification of the pulmonary arteries. There is no central,segmental, or subsegmental filling defects within the pulmonary arteries. The heart is normal in size. No pericardial effusion or thickening. No evidence right heart strain. There is normal three-vessel brachiocephalic anatomy without proximal stenosis. Minimal aortic arch calcifications are seen. Mediastinum/Nodes: Scattered pretracheal, subcarinal, and right hilar lymph nodes are noted the largest measuring 1.3 cm in the subcarinal region. Thyroid gland, trachea, and esophagus demonstrate no significant findings. Lungs/Pleura: Mild centrilobular emphysematous changes are seen at both lung apices. There is interlobular septal thickening with ground-glass opacities seen throughout both lungs, predominantly within the lower lobes. No pleural effusion is seen. No large airspace consolidation is seen. Upper Abdomen: No acute abnormalities present in the visualized portions of the upper abdomen. Musculoskeletal: No chest wall abnormality. No acute or significant osseous findings. Review of the MIP images confirms the above findings. IMPRESSION: No central, segmental, or subsegmental pulmonary embolism. Interlobular septal thickening and there are a spectrum of findings in the lungs which can be seen with acute viral atypical infection (as well as other non-infectious etiologies such as edema). Mild centrilobular emphysematous changes in both lung apices. Electronically Signed   By: Prudencio Pair M.D.   On: 12/24/2018 22:09   Dg Chest Port 1  View  Result Date: 12/13/2018 CLINICAL DATA:  Dyspnea EXAM: PORTABLE CHEST 1 VIEW COMPARISON:  Portable exam 0556 hours compared to 12/21/2018 FINDINGS: Stable heart size mediastinal contours. Diffuse interstitial infiltrates bilaterally, slightly increased, consistent with pneumonia and history of COVID-19. No pleural effusion or pneumothorax. Bones unremarkable. IMPRESSION: Increased BILATERAL pulmonary infiltrates consistent with pneumonia. Electronically Signed   By: Lavonia Dana M.D.   On: 12/13/2018 07:58   Dg Chest Portable 1 View  Result Date: 12/01/2018 CLINICAL DATA:  Shortness of breath and COVID-19 positivity EXAM: PORTABLE CHEST 1 VIEW COMPARISON:  12/06/2018 FINDINGS: Cardiac shadow is stable. The lungs are well aerated bilaterally. Increasing interstitial markings are noted throughout the mid and lower lung fields bilaterally consistent with the patient's given clinical history of COVID-19 positivity. No focal confluent infiltrate is seen. No effusion is noted. No bony abnormality is seen. IMPRESSION: Increasing interstitial markings consistent with the patient's given clinical history of COVID-19 positivity. Electronically Signed   By: Inez Catalina M.D.   On: 12/30/2018 20:31

## 2018-12-19 NOTE — Progress Notes (Signed)
Patient very tearful and anxious about witnessing deterioration in several previous roommate's conditions requiring emergency interventions. Emotional support provided to patient and Dr. Sloan Leiter paged to request PRN anti-anxiety medication per patient request. MD returned call and will place a PRN order. Will continue to monitor patient and provide emotional support.

## 2018-12-19 NOTE — Progress Notes (Signed)
Patient's husband Shanon Brow returned call to ICU, update on patient's status provided. All questions answered.

## 2018-12-20 LAB — COMPREHENSIVE METABOLIC PANEL
ALT: 161 U/L — ABNORMAL HIGH (ref 0–44)
AST: 46 U/L — ABNORMAL HIGH (ref 15–41)
Albumin: 2.8 g/dL — ABNORMAL LOW (ref 3.5–5.0)
Alkaline Phosphatase: 63 U/L (ref 38–126)
Anion gap: 15 (ref 5–15)
BUN: 48 mg/dL — ABNORMAL HIGH (ref 8–23)
CO2: 33 mmol/L — ABNORMAL HIGH (ref 22–32)
Calcium: 8.3 mg/dL — ABNORMAL LOW (ref 8.9–10.3)
Chloride: 83 mmol/L — ABNORMAL LOW (ref 98–111)
Creatinine, Ser: 1.2 mg/dL — ABNORMAL HIGH (ref 0.44–1.00)
GFR calc Af Amer: 55 mL/min — ABNORMAL LOW (ref >60)
GFR calc non Af Amer: 48 mL/min — ABNORMAL LOW (ref >60)
Glucose, Bld: 297 mg/dL — ABNORMAL HIGH (ref 70–99)
Potassium: 3.3 mmol/L — ABNORMAL LOW (ref 3.5–5.1)
Sodium: 131 mmol/L — ABNORMAL LOW (ref 135–145)
Total Bilirubin: 1 mg/dL (ref 0.3–1.2)
Total Protein: 5.9 g/dL — ABNORMAL LOW (ref 6.5–8.1)

## 2018-12-20 LAB — GLUCOSE, CAPILLARY
Glucose-Capillary: 226 mg/dL — ABNORMAL HIGH (ref 70–99)
Glucose-Capillary: 144 mg/dL — ABNORMAL HIGH (ref 70–99)
Glucose-Capillary: 192 mg/dL — ABNORMAL HIGH (ref 70–99)
Glucose-Capillary: 278 mg/dL — ABNORMAL HIGH (ref 70–99)
Glucose-Capillary: 212 mg/dL — ABNORMAL HIGH (ref 70–99)

## 2018-12-20 LAB — CBC
HCT: 46.6 % — ABNORMAL HIGH (ref 36.0–46.0)
Hemoglobin: 15.9 g/dL — ABNORMAL HIGH (ref 12.0–15.0)
MCH: 30.3 pg (ref 26.0–34.0)
MCHC: 34.1 g/dL (ref 30.0–36.0)
MCV: 88.9 fL (ref 80.0–100.0)
Platelets: 234 10*3/uL (ref 150–400)
RBC: 5.24 MIL/uL — ABNORMAL HIGH (ref 3.87–5.11)
RDW: 12.5 % (ref 11.5–15.5)
WBC: 21.4 10*3/uL — ABNORMAL HIGH (ref 4.0–10.5)
nRBC: 0 % (ref 0.0–0.2)

## 2018-12-20 LAB — C-REACTIVE PROTEIN: CRP: 1.2 mg/dL — ABNORMAL HIGH (ref <1.0)

## 2018-12-20 LAB — FERRITIN: Ferritin: 231 ng/mL (ref 11–307)

## 2018-12-20 LAB — D-DIMER, QUANTITATIVE: D-Dimer, Quant: 1.04 ug/mL-FEU — ABNORMAL HIGH (ref 0.00–0.50)

## 2018-12-20 MED ORDER — FUROSEMIDE 10 MG/ML IJ SOLN
40.0000 mg | Freq: Two times a day (BID) | INTRAMUSCULAR | Status: AC
  Administered 2018-12-20 (×2): 40 mg via INTRAVENOUS
  Filled 2018-12-20 (×2): qty 4

## 2018-12-20 NOTE — Progress Notes (Signed)
Transferred to room 107 and 100% NRB and CM. Patient tolerated transfer well, placed back on [place] 15L.Report given to [employer].

## 2018-12-20 NOTE — Progress Notes (Addendum)
PROGRESS NOTE                                                                                                                                                                                                             Patient Demographics:    Kim Harrison, is a 63 y.o. female, DOB - [birth year], AS:[id number]  Outpatient Primary MD for the patient is Kim Miner, MD   Admit date - 12/20/2018   LOS - 15  Chief Complaint  Patient presents with   Shortness of Breath   Cough       Brief Narrative: Patient is a 63 y.o. female with PMHx of sarcoidosis, hypothyroidism, dyslipidemia-presented with fever, shortness of breath-found to have acute hypoxic respiratory failure.  Hospital course complicated by worsening hypoxemia despite maximal treatment for COVID-19.  See below for further details  Significant events: 11/9>>admit [facility] 11/14>>Transfer to ICU 11/20>transfer to PCU  COVID-19 Medications: Steroids:11/9>> Remdesivir:11/9>>11/14 Actemra: x 2 on 11/11 and 11/13 Convalescent Plasma:x 1 on 11/11   Subjective:    Kim Harrison remains stable-FiO2 slowly coming down-on around 10 to 13 L of oxygen at rest-when she moves-she requires more oxygen support.   Assessment  & Plan :   Acute Hypoxic Resp Failure due to Covid 19 Viral pneumonia: Slowly improving-has been stable on anywhere from 10-13 L of high flow oxygen over the past few days-continue to taper steroids-keep in negative balance and monitor closely  Fever: afebrile  O2 requirements:  SpO2: 91 % O2 Flow Rate (L/min): 13 L/min FiO2 (%): 100 %   COVID-19 Labs: Recent Labs    12/18/18 0532 12/19/18 0515  DDIMER 1.04* 0.97*  FERRITIN 149 153  LDH 339* 328*  CRP <0.8 <0.8    No results found for: SARSCOV2NAA    Other medications: Diuretics:Euvolemic-repeat Lasix x1 to maintain negative balance Antibiotics:Not needed as no  evidence of bacterial infection  Prone/Incentive Spirometry: encouraged patient to lie prone for 3-4 hours at a time for a total of 16 hours a day, and to encourage incentive spirometry use 3-4/hour.  DVT Prophylaxis  :  Fondaparinux  Anxiety: While in the ICU-as seen numerous other patients in her pod-undergo resuscitation for cardiac arrest or get intubated.  Very anxious and tearful at times-start as needed Klonopin.    Leukocytosis: Likely secondary to steroids-no indication of superimposed infection.  Follow.  Hypothyroidism:continue synthroid  Hyperlipidemia:contiue pravachol  History of sarcoidosis: Not on chronic steroids at home  GI prophylaxis: PPI  Consults  :  PCCM  ABG: No results found for: PHART, PCO2ART, PO2ART, HCO3, TCO2, ACIDBASEDEF, O2SAT  Vent Settings: N/A  Condition - Guarded  Family Communication  :  Spouse over the phone 11/20  Code Status :  Full Code  Diet :  Diet Order            Diet regular Room service appropriate? Yes; Fluid consistency: Thin  Diet effective now               Disposition Plan  :  Remain hospitalized-transfer to PCU  Barriers to discharge: Hypoxia requiring O2 supplementation  Antimicorbials  :    Anti-infectives (From admission, onward)   Start     Dose/Rate Route Frequency Ordered Stop   12/11/18 1000  remdesivir 100 mg in sodium chloride 0.9 % 250 mL IVPB     100 mg 500 mL/hr over 30 Minutes Intravenous Every 24 hours 12/10/18 0224 12/14/18 1120   12/10/18 0300  remdesivir 200 mg in sodium chloride 0.9 % 250 mL IVPB     200 mg 500 mL/hr over 30 Minutes Intravenous Once 12/10/18 0224 12/10/18 [id number]      Inpatient Medications  Scheduled Meds:  Chlorhexidine Gluconate Cloth  6 each Topical Daily   cholecalciferol  1,000 Units Oral Daily   feeding supplement (ENSURE ENLIVE)  237 mL Oral TID WC   fondaparinux (ARIXTRA) injection  2.5 mg Subcutaneous Q0600   furosemide  40 mg Intravenous Q12H    insulin aspart  0-20 Units Subcutaneous TID WC   insulin aspart  0-5 Units Subcutaneous QHS   levothyroxine  75 mcg Oral Daily   mouth rinse  15 mL Mouth Rinse BID   methylPREDNISolone (SOLU-MEDROL) injection  20 mg Intravenous Q12H   pantoprazole  40 mg Oral Daily   vitamin C  500 mg Oral Daily   zinc sulfate  220 mg Oral Daily   Continuous Infusions: PRN Meds:.benzonatate, chlorpheniramine-HYDROcodone, clonazePAM, guaiFENesin-dextromethorphan, Ipratropium-Albuterol, sodium chloride   Time Spent in minutes  35   See all Orders from today for further details  Oren Binet M.D on 12/20/2018 at 3:33 PM  To page go to [email or web address] - use universal password  [facility] -  Office  [phone]    Objective:   Vitals:   12/20/18 0900 12/20/18 1000 12/20/18 1100 12/20/18 1141  BP: 104/74 100/77 110/69   Pulse: 78  86 90  Resp: 17 (!) 21 20 (!) 21  Temp:    97.8 F (36.6 C)  TempSrc:      SpO2: 100%  (!) 89% 91%  Weight:      Height:        Wt Readings from Last 3 Encounters:  12/18/18 60.6 kg  07/23/15 70.3 kg  02/04/14 76.1 kg     Intake/Output Summary (Last 24 hours) at 12/20/2018 1533 Last data filed at 12/20/2018 0000 Gross per 24 hour  Intake 285 ml  Output 660 ml  Net -375 ml     Physical Exam Gen Exam:Alert awake-not in any distress HEENT:atraumatic, normocephalic Chest: B/L clear to auscultation anteriorly  CVS:S1S2 regular Abdomen:soft non tender, non distended Extremities:no edema Neurology: Non focal Skin: no rash   Data Review:    CBC Recent Labs  Lab 12/15/18 0600 12/16/18 0520 12/17/18 0715 12/18/18 0532 12/19/18 0515  WBC 12.4* 14.0* 14.2* 15.3* 20.8*  HGB 14.4  14.4 13.4 15.1* 16.3*  HCT 43.6 43.1 39.2 44.1 47.7*  PLT 248 282 249 271 280  MCV 91.0 90.5 89.7 88.9 90.0  MCH 30.1 30.3 30.7 30.4 30.8  MCHC 33.0 33.4 34.2 34.2 34.2  RDW 12.2 12.1 12.2 12.4 12.6    Chemistries  Recent Labs  Lab 12/14/18 0429  12/15/18 0600 12/16/18 0520 12/17/18 0715 12/18/18 0532 12/19/18 0515  NA 137 136 138 136 135 135  K 4.7 4.7 5.2* 4.7 4.3 4.0  CL 101 100 101 102 92* 88*  CO2 26 27 29 26 31  33*  GLUCOSE 172* 183* 175* 206* 260* 222*  BUN 37* 35* 37* 36* 43* 46*  CREATININE 0.94 0.95 0.92 0.84 1.06* 1.17*  CALCIUM 8.5* 8.5* 8.6* 8.2* 8.6* 8.7*  MG 2.5* 2.6* 2.5* 2.4 2.6* 2.8*  AST 62*  --   --   --   --   --   ALT 123*  --   --   --   --   --   ALKPHOS 61  --   --   --   --   --   BILITOT 0.6  --   --   --   --   --    ------------------------------------------------------------------------------------------------------------------ No results for input(s): CHOL, HDL, LDLCALC, TRIG, CHOLHDL, LDLDIRECT in the last 72 hours.  Lab Results  Component Value Date   HGBA1C 6.2 (H) 12/15/2018   ------------------------------------------------------------------------------------------------------------------ No results for input(s): TSH, T4TOTAL, T3FREE, THYROIDAB in the last 72 hours.  Invalid input(s): FREET3 ------------------------------------------------------------------------------------------------------------------ Recent Labs    12/18/18 0532 12/19/18 0515  FERRITIN 149 153    Coagulation profile No results for input(s): INR, PROTIME in the last 168 hours.  Recent Labs    12/18/18 0532 12/19/18 0515  DDIMER 1.04* 0.97*    Cardiac Enzymes No results for input(s): CKMB, TROPONINI, MYOGLOBIN in the last 168 hours.  Invalid input(s): CK ------------------------------------------------------------------------------------------------------------------    Component Value Date/Time   BNP 60.6 12/15/2018 0600    Micro Results Recent Results (from the past 240 hour(s))  MRSA PCR Screening     Status: None   Collection Time: 12/14/18  9:50 PM   Specimen: Nasal Mucosa; Nasopharyngeal  Result Value Ref Range Status   MRSA by PCR NEGATIVE NEGATIVE Final    Comment:        The  GeneXpert MRSA Assay (FDA approved for NASAL specimens only), is one component of a comprehensive MRSA colonization surveillance program. It is not intended to diagnose MRSA infection nor to guide or monitor treatment for MRSA infections. Performed at [facility], [place] [address], [place], [place] [address]     Radiology Reports Ct Angio Chest Pe W Or Wo Contrast  Result Date: 12/14/2018 CLINICAL DATA:  Chest pain. EXAM: CT ANGIOGRAPHY CHEST WITH CONTRAST TECHNIQUE: Multidetector CT imaging of the chest was performed using the standard protocol during bolus administration of intravenous contrast. Multiplanar CT image reconstructions and MIPs were obtained to evaluate the vascular anatomy. CONTRAST:  59mL OMNIPAQUE IOHEXOL 350 MG/ML SOLN COMPARISON:  12/15/2018 FINDINGS: Cardiovascular:  Cardiovascular And no sign of acute pulmonary embolism. Mediastinum/Nodes: Enlarged lymph nodes throughout the mediastinum. (Image 52, series 5) 1.3 cm right paratracheal lymph node previously as large as 1.6 cm. 1.2 cm right paratracheal lymph node (image 41, series 5) previously 1.2 cm. (Image 66, series 5): Stable 1.3 cm right subcarinal lymph node with other smaller subcarinal lymph nodes. Mild enlargement of perihilar lymph nodes.) Lungs/Pleura: Septal thickening and ground-glass  is moderately is worse than on the prior exam particularly in the right upper lobe inferiorly as well as the extent of disease overall but particularly tracking in the left upper lobe. A. Small nodular focus has developed in the left upper lobe since the prior study. No signs of dense consolidation. Airways are patent. Upper Abdomen: Small hiatal hernia. No signs of acute process in the upper abdomen. Post cholecystectomy. Musculoskeletal: No acute bone finding or destructive bone process. Review of the MIP images confirms the above findings. IMPRESSION: Moderate is worsening of ground-glass and septal thickening  since the prior exam could represent viral pneumonia or asymmetric edema superimposed on changes of sarcoidosis. Small nodular focus in the left mid chest is likely inflammatory and new since prior exam. No signs of acute pulmonary embolism. Aortic Atherosclerosis (ICD10-I70.0). Electronically Signed   By: Zetta Bills M.D.   On: 12/14/2018 12:29   Ct Angio Chest Pe W And/or Wo Contrast  Result Date: 12/21/2018 CLINICAL DATA:  Shortness of breath, covid positive EXAM: CT ANGIOGRAPHY CHEST WITH CONTRAST TECHNIQUE: Multidetector CT imaging of the chest was performed using the standard protocol during bolus administration of intravenous contrast. Multiplanar CT image reconstructions and MIPs were obtained to evaluate the vascular anatomy. CONTRAST:  64mL OMNIPAQUE IOHEXOL 350 MG/ML SOLN COMPARISON:  None. FINDINGS: Cardiovascular: There is a optimal opacification of the pulmonary arteries. There is no central,segmental, or subsegmental filling defects within the pulmonary arteries. The heart is normal in size. No pericardial effusion or thickening. No evidence right heart strain. There is normal three-vessel brachiocephalic anatomy without proximal stenosis. Minimal aortic arch calcifications are seen. Mediastinum/Nodes: Scattered pretracheal, subcarinal, and right hilar lymph nodes are noted the largest measuring 1.3 cm in the subcarinal region. Thyroid gland, trachea, and esophagus demonstrate no significant findings. Lungs/Pleura: Mild centrilobular emphysematous changes are seen at both lung apices. There is interlobular septal thickening with ground-glass opacities seen throughout both lungs, predominantly within the lower lobes. No pleural effusion is seen. No large airspace consolidation is seen. Upper Abdomen: No acute abnormalities present in the visualized portions of the upper abdomen. Musculoskeletal: No chest wall abnormality. No acute or significant osseous findings. Review of the MIP images  confirms the above findings. IMPRESSION: No central, segmental, or subsegmental pulmonary embolism. Interlobular septal thickening and there are a spectrum of findings in the lungs which can be seen with acute viral atypical infection (as well as other non-infectious etiologies such as edema). Mild centrilobular emphysematous changes in both lung apices. Electronically Signed   By: Prudencio Pair M.D.   On: 12/06/2018 22:09   Dg Chest Port 1 View  Result Date: 12/13/2018 CLINICAL DATA:  Dyspnea EXAM: PORTABLE CHEST 1 VIEW COMPARISON:  Portable exam 0556 hours compared to 12/06/2018 FINDINGS: Stable heart size mediastinal contours. Diffuse interstitial infiltrates bilaterally, slightly increased, consistent with pneumonia and history of COVID-19. No pleural effusion or pneumothorax. Bones unremarkable. IMPRESSION: Increased BILATERAL pulmonary infiltrates consistent with pneumonia. Electronically Signed   By: Lavonia Dana M.D.   On: 12/13/2018 07:58   Dg Chest Portable 1 View  Result Date: 12/01/2018 CLINICAL DATA:  Shortness of breath and COVID-19 positivity EXAM: PORTABLE CHEST 1 VIEW COMPARISON:  12/06/2018 FINDINGS: Cardiac shadow is stable. The lungs are well aerated bilaterally. Increasing interstitial markings are noted throughout the mid and lower lung fields bilaterally consistent with the patient's given clinical history of COVID-19 positivity. No focal confluent infiltrate is seen. No effusion is noted. No bony abnormality is seen. IMPRESSION: Increasing interstitial  markings consistent with the patient's given clinical history of COVID-19 positivity. Electronically Signed   By: Inez Catalina M.D.   On: 12/27/2018 20:31

## 2018-12-21 LAB — GLUCOSE, CAPILLARY
Glucose-Capillary: 134 mg/dL — ABNORMAL HIGH (ref 70–99)
Glucose-Capillary: 212 mg/dL — ABNORMAL HIGH (ref 70–99)
Glucose-Capillary: 184 mg/dL — ABNORMAL HIGH (ref 70–99)
Glucose-Capillary: 185 mg/dL — ABNORMAL HIGH (ref 70–99)

## 2018-12-21 LAB — COMPREHENSIVE METABOLIC PANEL
ALT: 141 U/L — ABNORMAL HIGH (ref 0–44)
AST: 33 U/L (ref 15–41)
Albumin: 3.1 g/dL — ABNORMAL LOW (ref 3.5–5.0)
Alkaline Phosphatase: 68 U/L (ref 38–126)
Anion gap: 16 — ABNORMAL HIGH (ref 5–15)
BUN: 44 mg/dL — ABNORMAL HIGH (ref 8–23)
CO2: 36 mmol/L — ABNORMAL HIGH (ref 22–32)
Calcium: 8.2 mg/dL — ABNORMAL LOW (ref 8.9–10.3)
Chloride: 83 mmol/L — ABNORMAL LOW (ref 98–111)
Creatinine, Ser: 1.1 mg/dL — ABNORMAL HIGH (ref 0.44–1.00)
GFR calc Af Amer: >60 mL/min (ref >60)
GFR calc non Af Amer: 53 mL/min — ABNORMAL LOW (ref >60)
Glucose, Bld: 149 mg/dL — ABNORMAL HIGH (ref 70–99)
Potassium: 3.4 mmol/L — ABNORMAL LOW (ref 3.5–5.1)
Sodium: 135 mmol/L (ref 135–145)
Total Bilirubin: 1.5 mg/dL — ABNORMAL HIGH (ref 0.3–1.2)
Total Protein: 5.9 g/dL — ABNORMAL LOW (ref 6.5–8.1)

## 2018-12-21 LAB — CBC
HCT: 46.9 % — ABNORMAL HIGH (ref 36.0–46.0)
Hemoglobin: 16 g/dL — ABNORMAL HIGH (ref 12.0–15.0)
MCH: 30.3 pg (ref 26.0–34.0)
MCHC: 34.1 g/dL (ref 30.0–36.0)
MCV: 88.8 fL (ref 80.0–100.0)
Platelets: 231 10*3/uL (ref 150–400)
RBC: 5.28 MIL/uL — ABNORMAL HIGH (ref 3.87–5.11)
RDW: 12.5 % (ref 11.5–15.5)
WBC: 21.5 10*3/uL — ABNORMAL HIGH (ref 4.0–10.5)
nRBC: 0 % (ref 0.0–0.2)

## 2018-12-21 LAB — D-DIMER, QUANTITATIVE: D-Dimer, Quant: 0.96 ug/mL-FEU — ABNORMAL HIGH (ref 0.00–0.50)

## 2018-12-21 LAB — FERRITIN: Ferritin: 202 ng/mL (ref 11–307)

## 2018-12-21 LAB — C-REACTIVE PROTEIN: CRP: <0.8 mg/dL (ref <1.0)

## 2018-12-21 MED ORDER — ESCITALOPRAM OXALATE 10 MG PO TABS
10.0000 mg | ORAL_TABLET | Freq: Every day | ORAL | Status: DC
  Administered 2018-12-22 – 2018-12-30 (×9): 10 mg via ORAL
  Filled 2018-12-21 (×10): qty 1

## 2018-12-21 MED ORDER — POLYETHYLENE GLYCOL 3350 17 G PO PACK
17.0000 g | PACK | Freq: Every day | ORAL | Status: DC
  Administered 2018-12-21 – 2018-12-23 (×3): 17 g via ORAL
  Filled 2018-12-21 (×3): qty 1

## 2018-12-21 MED ORDER — ALUM & MAG HYDROXIDE-SIMETH 200-200-20 MG/5ML PO SUSP
15.0000 mL | Freq: Four times a day (QID) | ORAL | Status: DC | PRN
  Administered 2018-12-21: 15 mL via ORAL
  Filled 2018-12-21 (×2): qty 30

## 2018-12-21 NOTE — Plan of Care (Signed)
Patient Summary: No adverse events throughout the night. No SOB or acute distress noted. Patient given Maalox for gas pain relief per MD orders. See MAR re: medications administered. See flowsheet re: VS and assessment. Patient sat >90% throughout the night with no acute distress episodes noted. Plan of care discussed with patient and her husband. Addressed all questions and concerns. Plan of care continued. Bed in low locked position. Problem: Education: Goal: Knowledge of risk factors and measures for prevention of condition will improve Outcome: Progressing   Problem: Coping: Goal: Psychosocial and spiritual needs will be supported Outcome: Progressing   Problem: Respiratory: Goal: Will maintain a patent airway Outcome: Progressing Goal: Complications related to the disease process, condition or treatment will be avoided or minimized Outcome: Progressing   Problem: Education: Goal: Knowledge of General Education information will improve Description: Including pain rating scale, medication(s)/side effects and non-pharmacologic comfort measures Outcome: Progressing   Problem: Health Behavior/Discharge Planning: Goal: Ability to manage health-related needs will improve Outcome: Progressing   Problem: Clinical Measurements: Goal: Ability to maintain clinical measurements within normal limits will improve Outcome: Progressing Goal: Will remain free from infection Outcome: Progressing Goal: Diagnostic test results will improve Outcome: Progressing Goal: Respiratory complications will improve Outcome: Progressing Goal: Cardiovascular complication will be avoided Outcome: Progressing   Problem: Activity: Goal: Risk for activity intolerance will decrease Outcome: Progressing   Problem: Nutrition: Goal: Adequate nutrition will be maintained Outcome: Progressing   Problem: Coping: Goal: Level of anxiety will decrease Outcome: Progressing   Problem: Elimination: Goal: Will not  experience complications related to bowel motility Outcome: Progressing Goal: Will not experience complications related to urinary retention Outcome: Progressing   Problem: Pain Managment: Goal: General experience of comfort will improve Outcome: Progressing   Problem: Safety: Goal: Ability to remain free from injury will improve Outcome: Progressing   Problem: Skin Integrity: Goal: Risk for impaired skin integrity will decrease Outcome: Progressing

## 2018-12-21 NOTE — Progress Notes (Addendum)
PROGRESS NOTE                                                                                                                                                                                                             Patient Demographics:    Kim Harrison, is a 63 y.o. female, DOB - 04/06/1955, XN:[id number]  Outpatient Primary MD for the patient is Lou Miner, MD   Admit date - 12/17/2018   LOS - 29  Chief Complaint  Patient presents with  . Shortness of Breath  . Cough       Brief Narrative: Patient is a 63 y.o. female with PMHx of sarcoidosis, hypothyroidism, dyslipidemia-presented with fever, shortness of breath-found to have acute hypoxic respiratory failure.  Hospital course complicated by worsening hypoxemia despite maximal treatment for COVID-19.  See below for further details  Significant events: 11/9>>admit [facility] 11/14>>Transfer to ICU 11/20>transfer to PCU  COVID-19 Medications: Steroids:11/9>> Remdesivir:11/9>>11/14 Actemra: x 2 on 11/11 and 11/13 Convalescent Plasma:x 1 on 11/11   Subjective:    [name] remains a somewhat anxious-she is requiring anywhere from 12-15 L of high flow oxygen.  She remains essentially the same for the past few days.   Assessment  & Plan :   Acute Hypoxic Resp Failure due to Covid 19 Viral pneumonia: Remains essentially the same for the past 2 days-requiring anywhere from 12-15 L of high flow oxygen.  Appears very comfortable.  Continue to taper steroids-continue IV Lasix in an attempt to keep patient in negative balance.  Continue to closely monitor in the progressive care unit.  Fever: afebrile  O2 requirements:  SpO2: (!) 88 % O2 Flow Rate (L/min): 14 L/min FiO2 (%): 100 %   COVID-19 Labs: Recent Labs    12/19/18 0515 12/20/18 1635 12/21/18 0345  DDIMER 0.97* 1.04* 0.96*  FERRITIN 153 231 202  LDH 328*  --   --   CRP <0.8 1.2* <0.8     No results found for: SARSCOV2NAA    Other medications: Diuretics:Euvolemic-repeat Lasix x1 to maintain negative balance Antibiotics:Not needed as no evidence of bacterial infection  Prone/Incentive Spirometry: encouraged patient to lie prone for 3-4 hours at a time for a total of 16 hours a day, and to encourage incentive spirometry use 3-4/hour.  DVT Prophylaxis  :  Fondaparinux  Anxiety: While in the ICU-she  has seen numerous other patients in her pod-undergo resuscitation for cardiac arrest or get intubated.  Remains very anxious-continue as needed Klonopin-starting Lexapro.  V  Leukocytosis: Likely secondary to steroids-no indication of superimposed infection.  But will repeat procalcitonin tomorrow-continue to follow closely.    Hypothyroidism:continue synthroid  Hyperlipidemia:contiue pravachol  History of sarcoidosis: Not on chronic steroids at home  GI prophylaxis: PPI  Consults  :  PCCM  ABG: No results found for: PHART, PCO2ART, PO2ART, HCO3, TCO2, ACIDBASEDEF, O2SAT  Vent Settings: N/A  Condition - Guarded  Family Communication  :  Spouse over the phone 11/21  Code Status :  Full Code  Diet :  Diet Order            Diet regular Room service appropriate? Yes; Fluid consistency: Thin  Diet effective now               Disposition Plan  :  Remain hospitalized-transfer to PCU  Barriers to discharge: Hypoxia requiring O2 supplementation  Antimicorbials  :    Anti-infectives (From admission, onward)   Start     Dose/Rate Route Frequency Ordered Stop   12/11/18 1000  remdesivir 100 mg in sodium chloride 0.9 % 250 mL IVPB     100 mg 500 mL/hr over 30 Minutes Intravenous Every 24 hours 12/10/18 0224 12/14/18 1120   12/10/18 0300  remdesivir 200 mg in sodium chloride 0.9 % 250 mL IVPB     200 mg 500 mL/hr over 30 Minutes Intravenous Once 12/10/18 0224 12/10/18 0727      Inpatient Medications  Scheduled Meds: . Chlorhexidine Gluconate Cloth  6  each Topical Daily  . cholecalciferol  1,000 Units Oral Daily  . escitalopram  10 mg Oral Daily  . feeding supplement (ENSURE ENLIVE)  237 mL Oral TID WC  . fondaparinux (ARIXTRA) injection  2.5 mg Subcutaneous Q0600  . insulin aspart  0-20 Units Subcutaneous TID WC  . insulin aspart  0-5 Units Subcutaneous QHS  . levothyroxine  75 mcg Oral Daily  . mouth rinse  15 mL Mouth Rinse BID  . methylPREDNISolone (SOLU-MEDROL) injection  20 mg Intravenous Q12H  . pantoprazole  40 mg Oral Daily  . polyethylene glycol  17 g Oral Daily  . vitamin C  500 mg Oral Daily  . zinc sulfate  220 mg Oral Daily   Continuous Infusions: PRN Meds:.alum & mag hydroxide-simeth, benzonatate, chlorpheniramine-HYDROcodone, clonazePAM, guaiFENesin-dextromethorphan, Ipratropium-Albuterol, sodium chloride   Time Spent in minutes  25   See all Orders from today for further details  Oren Binet M.D on 12/21/2018 at 2:51 PM  To page go to [email or web address] - use universal password  [facility] -  Office  [phone]    Objective:   Vitals:   12/21/18 1133 12/21/18 1200 12/21/18 1300 12/21/18 1400  BP: 97/63 116/74    Pulse: 87 94 97 98  Resp: 18 20 20  (!) 23  Temp: 98 F (36.7 C)     TempSrc: Oral     SpO2: 90% 92% 91% (!) 88%  Weight:      Height:        Wt Readings from Last 3 Encounters:  12/18/18 60.6 kg  07/23/15 70.3 kg  02/04/14 76.1 kg     Intake/Output Summary (Last 24 hours) at 12/21/2018 1451 Last data filed at 12/21/2018 1200 Gross per 24 hour  Intake 800 ml  Output 950 ml  Net -150 ml     Physical Exam Gen Exam:Alert awake-not in any  distress HEENT:atraumatic, normocephalic Chest: B/L clear to auscultation anteriorly CVS:S1S2 regular Abdomen:soft non tender, non distended Extremities:no edema Neurology: Non focal Skin: no rash   Data Review:    CBC Recent Labs  Lab 12/17/18 0715 12/18/18 0532 12/19/18 0515 12/20/18 1635 12/21/18 0345  WBC 14.2* 15.3*  20.8* 21.4* 21.5*  HGB 13.4 15.1* 16.3* 15.9* 16.0*  HCT 39.2 44.1 47.7* 46.6* 46.9*  PLT 249 271 280 234 231  MCV 89.7 88.9 90.0 88.9 88.8  MCH 30.7 30.4 30.8 30.3 30.3  MCHC 34.2 34.2 34.2 34.1 34.1  RDW 12.2 12.4 12.6 12.5 12.5    Chemistries  Recent Labs  Lab 12/15/18 0600 12/16/18 0520 12/17/18 0715 12/18/18 0532 12/19/18 0515 12/20/18 1635 12/21/18 0345  NA 136 138 136 135 135 131* 135  K 4.7 5.2* 4.7 4.3 4.0 3.3* 3.4*  CL 100 101 102 92* 88* 83* 83*  CO2 27 29 26 31  33* 33* 36*  GLUCOSE 183* 175* 206* 260* 222* 297* 149*  BUN 35* 37* 36* 43* 46* 48* 44*  CREATININE 0.95 0.92 0.84 1.06* 1.17* 1.20* 1.10*  CALCIUM 8.5* 8.6* 8.2* 8.6* 8.7* 8.3* 8.2*  MG 2.6* 2.5* 2.4 2.6* 2.8*  --   --   AST  --   --   --   --   --  46* 33  ALT  --   --   --   --   --  161* 141*  ALKPHOS  --   --   --   --   --  63 68  BILITOT  --   --   --   --   --  1.0 1.5*   ------------------------------------------------------------------------------------------------------------------ No results for input(s): CHOL, HDL, LDLCALC, TRIG, CHOLHDL, LDLDIRECT in the last 72 hours.  Lab Results  Component Value Date   HGBA1C 6.2 (H) 12/15/2018   ------------------------------------------------------------------------------------------------------------------ No results for input(s): TSH, T4TOTAL, T3FREE, THYROIDAB in the last 72 hours.  Invalid input(s): FREET3 ------------------------------------------------------------------------------------------------------------------ Recent Labs    12/20/18 1635 12/21/18 0345  FERRITIN 231 202    Coagulation profile No results for input(s): INR, PROTIME in the last 168 hours.  Recent Labs    12/20/18 1635 12/21/18 0345  DDIMER 1.04* 0.96*    Cardiac Enzymes No results for input(s): CKMB, TROPONINI, MYOGLOBIN in the last 168 hours.  Invalid input(s): CK  ------------------------------------------------------------------------------------------------------------------    Component Value Date/Time   BNP 60.6 12/15/2018 0600    Micro Results Recent Results (from the past 240 hour(s))  MRSA PCR Screening     Status: None   Collection Time: 12/14/18  9:50 PM   Specimen: Nasal Mucosa; Nasopharyngeal  Result Value Ref Range Status   MRSA by PCR NEGATIVE NEGATIVE Final    Comment:        The GeneXpert MRSA Assay (FDA approved for NASAL specimens only), is one component of a comprehensive MRSA colonization surveillance program. It is not intended to diagnose MRSA infection nor to guide or monitor treatment for MRSA infections. Performed at [facility], [place] [address], [place], [place] [address]     Radiology Reports Ct Angio Chest Pe W Or Wo Contrast  Result Date: 12/14/2018 CLINICAL DATA:  Chest pain. EXAM: CT ANGIOGRAPHY CHEST WITH CONTRAST TECHNIQUE: Multidetector CT imaging of the chest was performed using the standard protocol during bolus administration of intravenous contrast. Multiplanar CT image reconstructions and MIPs were obtained to evaluate the vascular anatomy. CONTRAST:  83mL OMNIPAQUE IOHEXOL 350 MG/ML SOLN COMPARISON:  12/05/2018  FINDINGS: Cardiovascular:  Cardiovascular And no sign of acute pulmonary embolism. Mediastinum/Nodes: Enlarged lymph nodes throughout the mediastinum. (Image 52, series 5) 1.3 cm right paratracheal lymph node previously as large as 1.6 cm. 1.2 cm right paratracheal lymph node (image 41, series 5) previously 1.2 cm. (Image 66, series 5): Stable 1.3 cm right subcarinal lymph node with other smaller subcarinal lymph nodes. Mild enlargement of perihilar lymph nodes.) Lungs/Pleura: Septal thickening and ground-glass is moderately is worse than on the prior exam particularly in the right upper lobe inferiorly as well as the extent of disease overall but particularly tracking in the left  upper lobe. A. Small nodular focus has developed in the left upper lobe since the prior study. No signs of dense consolidation. Airways are patent. Upper Abdomen: Small hiatal hernia. No signs of acute process in the upper abdomen. Post cholecystectomy. Musculoskeletal: No acute bone finding or destructive bone process. Review of the MIP images confirms the above findings. IMPRESSION: Moderate is worsening of ground-glass and septal thickening since the prior exam could represent viral pneumonia or asymmetric edema superimposed on changes of sarcoidosis. Small nodular focus in the left mid chest is likely inflammatory and new since prior exam. No signs of acute pulmonary embolism. Aortic Atherosclerosis (ICD10-I70.0). Electronically Signed   By: Zetta Bills M.D.   On: 12/14/2018 12:29   Ct Angio Chest Pe W And/or Wo Contrast  Result Date: 12/03/2018 CLINICAL DATA:  Shortness of breath, covid positive EXAM: CT ANGIOGRAPHY CHEST WITH CONTRAST TECHNIQUE: Multidetector CT imaging of the chest was performed using the standard protocol during bolus administration of intravenous contrast. Multiplanar CT image reconstructions and MIPs were obtained to evaluate the vascular anatomy. CONTRAST:  28mL OMNIPAQUE IOHEXOL 350 MG/ML SOLN COMPARISON:  None. FINDINGS: Cardiovascular: There is a optimal opacification of the pulmonary arteries. There is no central,segmental, or subsegmental filling defects within the pulmonary arteries. The heart is normal in size. No pericardial effusion or thickening. No evidence right heart strain. There is normal three-vessel brachiocephalic anatomy without proximal stenosis. Minimal aortic arch calcifications are seen. Mediastinum/Nodes: Scattered pretracheal, subcarinal, and right hilar lymph nodes are noted the largest measuring 1.3 cm in the subcarinal region. Thyroid gland, trachea, and esophagus demonstrate no significant findings. Lungs/Pleura: Mild centrilobular emphysematous changes  are seen at both lung apices. There is interlobular septal thickening with ground-glass opacities seen throughout both lungs, predominantly within the lower lobes. No pleural effusion is seen. No large airspace consolidation is seen. Upper Abdomen: No acute abnormalities present in the visualized portions of the upper abdomen. Musculoskeletal: No chest wall abnormality. No acute or significant osseous findings. Review of the MIP images confirms the above findings. IMPRESSION: No central, segmental, or subsegmental pulmonary embolism. Interlobular septal thickening and there are a spectrum of findings in the lungs which can be seen with acute viral atypical infection (as well as other non-infectious etiologies such as edema). Mild centrilobular emphysematous changes in both lung apices. Electronically Signed   By: Prudencio Pair M.D.   On: 12/27/2018 22:09   Dg Chest Port 1 View  Result Date: 12/13/2018 CLINICAL DATA:  Dyspnea EXAM: PORTABLE CHEST 1 VIEW COMPARISON:  Portable exam 0556 hours compared to 12/06/2018 FINDINGS: Stable heart size mediastinal contours. Diffuse interstitial infiltrates bilaterally, slightly increased, consistent with pneumonia and history of COVID-19. No pleural effusion or pneumothorax. Bones unremarkable. IMPRESSION: Increased BILATERAL pulmonary infiltrates consistent with pneumonia. Electronically Signed   By: Lavonia Dana M.D.   On: 12/13/2018 07:58   Dg Chest Portable  1 View  Result Date: 12/17/2018 CLINICAL DATA:  Shortness of breath and COVID-19 positivity EXAM: PORTABLE CHEST 1 VIEW COMPARISON:  12/06/2018 FINDINGS: Cardiac shadow is stable. The lungs are well aerated bilaterally. Increasing interstitial markings are noted throughout the mid and lower lung fields bilaterally consistent with the patient's given clinical history of COVID-19 positivity. No focal confluent infiltrate is seen. No effusion is noted. No bony abnormality is seen. IMPRESSION: Increasing interstitial  markings consistent with the patient's given clinical history of COVID-19 positivity. Electronically Signed   By: Inez Catalina M.D.   On: 12/20/2018 20:31

## 2018-12-21 NOTE — Progress Notes (Signed)
Paged Dr. Shanon Brow re: gas pain on both sides. Patient also spoke with husband c/o pain to gas pain/dull pain to bilateral sides. Paged Dr. Shanon Brow. Waiting a call back. Will continue to monitor patient.

## 2018-12-22 LAB — COMPREHENSIVE METABOLIC PANEL
ALT: 109 U/L — ABNORMAL HIGH (ref 0–44)
AST: 32 U/L (ref 15–41)
Albumin: 2.6 g/dL — ABNORMAL LOW (ref 3.5–5.0)
Alkaline Phosphatase: 62 U/L (ref 38–126)
Anion gap: 15 (ref 5–15)
BUN: 40 mg/dL — ABNORMAL HIGH (ref 8–23)
CO2: 35 mmol/L — ABNORMAL HIGH (ref 22–32)
Calcium: 8.5 mg/dL — ABNORMAL LOW (ref 8.9–10.3)
Chloride: 83 mmol/L — ABNORMAL LOW (ref 98–111)
Creatinine, Ser: 0.99 mg/dL (ref 0.44–1.00)
GFR calc Af Amer: >60 mL/min (ref >60)
GFR calc non Af Amer: >60 mL/min (ref >60)
Glucose, Bld: 177 mg/dL — ABNORMAL HIGH (ref 70–99)
Potassium: 3.3 mmol/L — ABNORMAL LOW (ref 3.5–5.1)
Sodium: 133 mmol/L — ABNORMAL LOW (ref 135–145)
Total Bilirubin: 1.1 mg/dL (ref 0.3–1.2)
Total Protein: 5.3 g/dL — ABNORMAL LOW (ref 6.5–8.1)

## 2018-12-22 LAB — D-DIMER, QUANTITATIVE: D-Dimer, Quant: 1.9 ug/mL-FEU — ABNORMAL HIGH (ref 0.00–0.50)

## 2018-12-22 LAB — GLUCOSE, CAPILLARY
Glucose-Capillary: 162 mg/dL — ABNORMAL HIGH (ref 70–99)
Glucose-Capillary: 228 mg/dL — ABNORMAL HIGH (ref 70–99)
Glucose-Capillary: 151 mg/dL — ABNORMAL HIGH (ref 70–99)
Glucose-Capillary: 172 mg/dL — ABNORMAL HIGH (ref 70–99)

## 2018-12-22 LAB — CBC
HCT: 45.6 % (ref 36.0–46.0)
Hemoglobin: 15.6 g/dL — ABNORMAL HIGH (ref 12.0–15.0)
MCH: 30.6 pg (ref 26.0–34.0)
MCHC: 34.2 g/dL (ref 30.0–36.0)
MCV: 89.4 fL (ref 80.0–100.0)
Platelets: 197 10*3/uL (ref 150–400)
RBC: 5.1 MIL/uL (ref 3.87–5.11)
RDW: 12.5 % (ref 11.5–15.5)
WBC: 18.1 10*3/uL — ABNORMAL HIGH (ref 4.0–10.5)
nRBC: 0 % (ref 0.0–0.2)

## 2018-12-22 LAB — C-REACTIVE PROTEIN: CRP: <0.8 mg/dL (ref <1.0)

## 2018-12-22 LAB — PROCALCITONIN: Procalcitonin: 0.23 ng/mL

## 2018-12-22 LAB — FERRITIN: Ferritin: 228 ng/mL (ref 11–307)

## 2018-12-22 MED ORDER — POTASSIUM CHLORIDE CRYS ER 20 MEQ PO TBCR: 40.0000 meq | EXTENDED_RELEASE_TABLET | Freq: Once | ORAL | Status: DC

## 2018-12-22 MED ORDER — POTASSIUM CHLORIDE 20 MEQ PO PACK
40.0000 meq | PACK | Freq: Once | ORAL | Status: AC
  Administered 2018-12-22: 40 meq via ORAL
  Filled 2018-12-22 (×2): qty 2

## 2018-12-22 MED ORDER — CLONAZEPAM 0.5 MG PO TABS: 0.5000 mg | ORAL_TABLET | Freq: Two times a day (BID) | ORAL | Status: DC

## 2018-12-22 MED ORDER — ALPRAZOLAM 0.25 MG PO TABS
0.5000 mg | ORAL_TABLET | Freq: Two times a day (BID) | ORAL | Status: DC | PRN
  Administered 2018-12-26: 0.5 mg via ORAL
  Filled 2018-12-22: qty 1

## 2018-12-22 MED ORDER — DOCUSATE SODIUM 100 MG PO CAPS
100.0000 mg | ORAL_CAPSULE | Freq: Two times a day (BID) | ORAL | Status: DC
  Administered 2018-12-22 – 2018-12-30 (×13): 100 mg via ORAL
  Filled 2018-12-22 (×13): qty 1

## 2018-12-22 MED ORDER — POTASSIUM CHLORIDE CRYS ER 20 MEQ PO TBCR: 20.0000 meq | EXTENDED_RELEASE_TABLET | Freq: Once | ORAL | Status: DC

## 2018-12-22 MED ORDER — MAGNESIUM HYDROXIDE 400 MG/5ML PO SUSP
30.0000 mL | Freq: Two times a day (BID) | ORAL | Status: AC
  Administered 2018-12-22 (×2): 30 mL via ORAL
  Filled 2018-12-22 (×2): qty 30

## 2018-12-22 MED ORDER — CLONAZEPAM 1 MG PO TABS
1.0000 mg | ORAL_TABLET | Freq: Two times a day (BID) | ORAL | Status: DC
  Administered 2018-12-22 – 2018-12-30 (×12): 1 mg via ORAL
  Filled 2018-12-22 (×14): qty 1

## 2018-12-22 MED ORDER — METHYLPREDNISOLONE SODIUM SUCC 40 MG IJ SOLR
15.0000 mg | Freq: Two times a day (BID) | INTRAMUSCULAR | Status: DC
  Administered 2018-12-23: 15.2 mg via INTRAVENOUS
  Filled 2018-12-22 (×2): qty 1

## 2018-12-22 MED ORDER — SODIUM CHLORIDE 0.9 % IV SOLN
INTRAVENOUS | Status: DC
  Administered 2018-12-22 – 2018-12-23 (×2): via INTRAVENOUS

## 2018-12-22 MED ORDER — POTASSIUM CHLORIDE CRYS ER 20 MEQ PO TBCR
40.0000 meq | EXTENDED_RELEASE_TABLET | Freq: Once | ORAL | Status: AC
  Administered 2018-12-22: 40 meq via ORAL
  Filled 2018-12-22: qty 2

## 2018-12-22 NOTE — Progress Notes (Signed)
PROGRESS NOTE                                                                                                                                                                                                             Patient Demographics:    Kim Harrison, is a 63 y.o. female, DOB - 1955-08-27, [id number]  Outpatient Primary MD for the patient is Lou Miner, MD   Admit date - 12/19/2018   LOS - 12  Chief Complaint  Patient presents with  . Shortness of Breath  . Cough       Brief Narrative: Patient is a 63 y.o. female with PMHx of sarcoidosis, hypothyroidism, dyslipidemia-presented with fever, shortness of breath-found to have acute hypoxic respiratory failure.  Hospital course complicated by worsening hypoxemia despite maximal treatment for COVID-19.  See below for further details  Significant events: 11/9>>admit [facility] 11/14>>Transfer to ICU 11/20>transfer to PCU  COVID-19 Medications: Steroids:11/9>> Remdesivir:11/9>>11/14 Actemra: x 2 on 11/11 and 11/13 Convalescent Plasma:x 1 on 11/11   Subjective:   Patient in bed, appears comfortable, denies any headache, no fever, no chest pain or pressure, no shortness of breath , no abdominal pain. No focal weakness.   Assessment  & Plan :   Acute Hypoxic Resp Failure due to Covid 19 Viral pneumonia: she had severe disease was treated with IV steroids, remdesivir, 2 doses of Actemra along with convalescent plasma.  Was initially in ICU.  Still on high flow nasal cannula oxygen 12 to 15 L.  Note benched patient moves she requires 15 L high flow nasal cannula oxygen along with nonrebreather mask.  Once she is at rest oxygen can started to be tapered.    Requested her to advance activity, sit in chair in the morning, use flutter valve and I-S for pulmonary toiletry and then prone at night.  Patient's anxiety seems to be the main hindrance at this time.       O2 requirements:  SpO2: 99 % O2 Flow Rate (L/min): 10 L/min FiO2 (%): 100 %   COVID-19 Labs: Recent Labs    12/20/18 1635 12/21/18 0345 12/22/18 0246  DDIMER 1.04* 0.96*  --   FERRITIN 231 202 228  CRP 1.2* <0.8 <0.8    No results found for: SARSCOV2NAA    Severe Anxiety and Panic attacks : While in the ICU-she has  seen numerous other patients in her pod-undergo resuscitation for cardiac arrest or get intubated.  She remains extremely anxious and had a panic attack morning of 12/22/2018.  Has been on Lexapro added scheduled Klonopin with as needed Xanax.  Monitor closely.  Leukocytosis: Likely secondary to steroids-no indication of superimposed infection.  No productive cough or signs of infection, trend procalcitonin closely.  Hyperlipidemia: contiue pravachol  History of sarcoidosis: Not on chronic steroids at home  Hypokalemia.  Replaced will monitor.  Hypothyroidism.  On Synthroid.    Hypochloremia.  Likely due to excessive diuresis, will give gentle NS and monitor.  Discontinue Lasix.    GI prophylaxis: PPI  Consults  :  PCCM  Condition - Guarded  Family Communication  :  Spouse over the phone 11/22  Code Status :  Full Code  Diet :  Diet Order            Diet regular Room service appropriate? Yes; Fluid consistency: Thin  Diet effective now               Disposition Plan  :  Remain hospitalized-transfer to PCU  Barriers to discharge: Hypoxia requiring O2 supplementation  Antimicorbials  :    Anti-infectives (From admission, onward)   Start     Dose/Rate Route Frequency Ordered Stop   12/11/18 1000  remdesivir 100 mg in sodium chloride 0.9 % 250 mL IVPB     100 mg 500 mL/hr over 30 Minutes Intravenous Every 24 hours 12/10/18 0224 12/14/18 1120   12/10/18 0300  remdesivir 200 mg in sodium chloride 0.9 % 250 mL IVPB     200 mg 500 mL/hr over 30 Minutes Intravenous Once 12/10/18 0224 12/10/18 0727     DVT Prophylaxis  :  Fondaparinux   Inpatient Medications  Scheduled Meds: . Chlorhexidine Gluconate Cloth  6 each Topical Daily  . cholecalciferol  1,000 Units Oral Daily  . clonazepam  0.5 mg Oral BID  . docusate sodium  100 mg Oral BID  . escitalopram  10 mg Oral Daily  . feeding supplement (ENSURE ENLIVE)  237 mL Oral TID WC  . fondaparinux (ARIXTRA) injection  2.5 mg Subcutaneous Q0600  . insulin aspart  0-20 Units Subcutaneous TID WC  . insulin aspart  0-5 Units Subcutaneous QHS  . levothyroxine  75 mcg Oral Daily  . magnesium hydroxide  30 mL Oral BID  . mouth rinse  15 mL Mouth Rinse BID  . methylPREDNISolone (SOLU-MEDROL) injection  15.2 mg Intravenous Q12H  . pantoprazole  40 mg Oral Daily  . polyethylene glycol  17 g Oral Daily  . vitamin C  500 mg Oral Daily  . zinc sulfate  220 mg Oral Daily   Continuous Infusions: PRN Meds:.alum & mag hydroxide-simeth, benzonatate, chlorpheniramine-HYDROcodone, clonazePAM, guaiFENesin-dextromethorphan, Ipratropium-Albuterol, sodium chloride   Time Spent in minutes  25   See all Orders from today for further details  Lala Lund M.D on 12/22/2018 at 12:14 PM  To page go to [email or web address] - use universal password  [facility] -  Office  [phone]    Objective:   Vitals:   12/22/18 1140 12/22/18 1145 12/22/18 1150 12/22/18 1155  BP: 104/72     Pulse: 96 89 87 85  Resp: (!) 21 (!) 24 19 20   Temp: 98 F (36.7 C)     TempSrc: Oral     SpO2: 92% 96% 99% 99%  Weight:      Height:        Wt  Readings from Last 3 Encounters:  12/18/18 60.6 kg  07/23/15 70.3 kg  02/04/14 76.1 kg     Intake/Output Summary (Last 24 hours) at 12/22/2018 1214 Last data filed at 12/22/2018 1100 Gross per 24 hour  Intake 240 ml  Output 1050 ml  Net -810 ml     Physical Exam  Awake Alert, Oriented X 3, No new F.N deficits, anxious affect .AT,PERRAL Supple Neck,No JVD, No cervical lymphadenopathy appriciated.  Symmetrical Chest wall movement, Good air  movement bilaterally, CTAB RRR,No Gallops, Rubs or new Murmurs, No Parasternal Heave +ve B.Sounds, Abd Soft, No tenderness, No organomegaly appriciated, No rebound - guarding or rigidity. No Cyanosis, Clubbing or edema, No new Rash or bruise    Data Review:    CBC Recent Labs  Lab 12/18/18 0532 12/19/18 0515 12/20/18 1635 12/21/18 0345 12/22/18 0246  WBC 15.3* 20.8* 21.4* 21.5* 18.1*  HGB 15.1* 16.3* 15.9* 16.0* 15.6*  HCT 44.1 47.7* 46.6* 46.9* 45.6  PLT 271 280 234 231 197  MCV 88.9 90.0 88.9 88.8 89.4  MCH 30.4 30.8 30.3 30.3 30.6  MCHC 34.2 34.2 34.1 34.1 34.2  RDW 12.4 12.6 12.5 12.5 12.5    Chemistries  Recent Labs  Lab 12/16/18 0520 12/17/18 0715 12/18/18 0532 12/19/18 0515 12/20/18 1635 12/21/18 0345 12/22/18 0246  NA 138 136 135 135 131* 135 133*  K 5.2* 4.7 4.3 4.0 3.3* 3.4* 3.3*  CL 101 102 92* 88* 83* 83* 83*  CO2 29 26 31  33* 33* 36* 35*  GLUCOSE 175* 206* 260* 222* 297* 149* 177*  BUN 37* 36* 43* 46* 48* 44* 40*  CREATININE 0.92 0.84 1.06* 1.17* 1.20* 1.10* 0.99  CALCIUM 8.6* 8.2* 8.6* 8.7* 8.3* 8.2* 8.5*  MG 2.5* 2.4 2.6* 2.8*  --   --   --   AST  --   --   --   --  46* 33 32  ALT  --   --   --   --  161* 141* 109*  ALKPHOS  --   --   --   --  63 68 62  BILITOT  --   --   --   --  1.0 1.5* 1.1   ------------------------------------------------------------------------------------------------------------------ No results for input(s): CHOL, HDL, LDLCALC, TRIG, CHOLHDL, LDLDIRECT in the last 72 hours.  Lab Results  Component Value Date   HGBA1C 6.2 (H) 12/15/2018   ------------------------------------------------------------------------------------------------------------------ No results for input(s): TSH, T4TOTAL, T3FREE, THYROIDAB in the last 72 hours.  Invalid input(s): FREET3 ------------------------------------------------------------------------------------------------------------------ Recent Labs    12/21/18 0345 12/22/18 0246   FERRITIN 202 228    Coagulation profile No results for input(s): INR, PROTIME in the last 168 hours.  Recent Labs    12/20/18 1635 12/21/18 0345  DDIMER 1.04* 0.96*    Cardiac Enzymes No results for input(s): CKMB, TROPONINI, MYOGLOBIN in the last 168 hours.  Invalid input(s): CK ------------------------------------------------------------------------------------------------------------------    Component Value Date/Time   BNP 60.6 12/15/2018 0600    Micro Results Recent Results (from the past 240 hour(s))  MRSA PCR Screening     Status: None   Collection Time: 12/14/18  9:50 PM   Specimen: Nasal Mucosa; Nasopharyngeal  Result Value Ref Range Status   MRSA by PCR NEGATIVE NEGATIVE Final    Comment:        The GeneXpert MRSA Assay (FDA approved for NASAL specimens only), is one component of a comprehensive MRSA colonization surveillance program. It is not intended to diagnose MRSA infection nor to  guide or monitor treatment for MRSA infections. Performed at [facility], [place] [address] [place], [place] [address]     Radiology Reports Ct Angio Chest Pe W Or Wo Contrast  Result Date: 12/14/2018 CLINICAL DATA:  Chest pain. EXAM: CT ANGIOGRAPHY CHEST WITH CONTRAST TECHNIQUE: Multidetector CT imaging of the chest was performed using the standard protocol during bolus administration of intravenous contrast. Multiplanar CT image reconstructions and MIPs were obtained to evaluate the vascular anatomy. CONTRAST:  58mL OMNIPAQUE IOHEXOL 350 MG/ML SOLN COMPARISON:  12/28/2018 FINDINGS: Cardiovascular:  Cardiovascular And no sign of acute pulmonary embolism. Mediastinum/Nodes: Enlarged lymph nodes throughout the mediastinum. (Image 52, series 5) 1.3 cm right paratracheal lymph node previously as large as 1.6 cm. 1.2 cm right paratracheal lymph node (image 41, series 5) previously 1.2 cm. (Image 66, series 5): Stable 1.3 cm right subcarinal lymph node with other  smaller subcarinal lymph nodes. Mild enlargement of perihilar lymph nodes.) Lungs/Pleura: Septal thickening and ground-glass is moderately is worse than on the prior exam particularly in the right upper lobe inferiorly as well as the extent of disease overall but particularly tracking in the left upper lobe. A. Small nodular focus has developed in the left upper lobe since the prior study. No signs of dense consolidation. Airways are patent. Upper Abdomen: Small hiatal hernia. No signs of acute process in the upper abdomen. Post cholecystectomy. Musculoskeletal: No acute bone finding or destructive bone process. Review of the MIP images confirms the above findings. IMPRESSION: Moderate is worsening of ground-glass and septal thickening since the prior exam could represent viral pneumonia or asymmetric edema superimposed on changes of sarcoidosis. Small nodular focus in the left mid chest is likely inflammatory and new since prior exam. No signs of acute pulmonary embolism. Aortic Atherosclerosis (ICD10-I70.0). Electronically Signed   By: Zetta Bills M.D.   On: 12/14/2018 12:29   Ct Angio Chest Pe W And/or Wo Contrast  Result Date: 12/21/2018 CLINICAL DATA:  Shortness of breath, covid positive EXAM: CT ANGIOGRAPHY CHEST WITH CONTRAST TECHNIQUE: Multidetector CT imaging of the chest was performed using the standard protocol during bolus administration of intravenous contrast. Multiplanar CT image reconstructions and MIPs were obtained to evaluate the vascular anatomy. CONTRAST:  82mL OMNIPAQUE IOHEXOL 350 MG/ML SOLN COMPARISON:  None. FINDINGS: Cardiovascular: There is a optimal opacification of the pulmonary arteries. There is no central,segmental, or subsegmental filling defects within the pulmonary arteries. The heart is normal in size. No pericardial effusion or thickening. No evidence right heart strain. There is normal three-vessel brachiocephalic anatomy without proximal stenosis. Minimal aortic arch  calcifications are seen. Mediastinum/Nodes: Scattered pretracheal, subcarinal, and right hilar lymph nodes are noted the largest measuring 1.3 cm in the subcarinal region. Thyroid gland, trachea, and esophagus demonstrate no significant findings. Lungs/Pleura: Mild centrilobular emphysematous changes are seen at both lung apices. There is interlobular septal thickening with ground-glass opacities seen throughout both lungs, predominantly within the lower lobes. No pleural effusion is seen. No large airspace consolidation is seen. Upper Abdomen: No acute abnormalities present in the visualized portions of the upper abdomen. Musculoskeletal: No chest wall abnormality. No acute or significant osseous findings. Review of the MIP images confirms the above findings. IMPRESSION: No central, segmental, or subsegmental pulmonary embolism. Interlobular septal thickening and there are a spectrum of findings in the lungs which can be seen with acute viral atypical infection (as well as other non-infectious etiologies such as edema). Mild centrilobular emphysematous changes in both lung apices. Electronically Signed  By: Prudencio Pair M.D.   On: 12/28/2018 22:09   Dg Chest Port 1 View  Result Date: 12/13/2018 CLINICAL DATA:  Dyspnea EXAM: PORTABLE CHEST 1 VIEW COMPARISON:  Portable exam 0556 hours compared to 12/17/2018 FINDINGS: Stable heart size mediastinal contours. Diffuse interstitial infiltrates bilaterally, slightly increased, consistent with pneumonia and history of COVID-19. No pleural effusion or pneumothorax. Bones unremarkable. IMPRESSION: Increased BILATERAL pulmonary infiltrates consistent with pneumonia. Electronically Signed   By: Lavonia Dana M.D.   On: 12/13/2018 07:58   Dg Chest Portable 1 View  Result Date: 12/01/2018 CLINICAL DATA:  Shortness of breath and COVID-19 positivity EXAM: PORTABLE CHEST 1 VIEW COMPARISON:  12/06/2018 FINDINGS: Cardiac shadow is stable. The lungs are well aerated  bilaterally. Increasing interstitial markings are noted throughout the mid and lower lung fields bilaterally consistent with the patient's given clinical history of COVID-19 positivity. No focal confluent infiltrate is seen. No effusion is noted. No bony abnormality is seen. IMPRESSION: Increasing interstitial markings consistent with the patient's given clinical history of COVID-19 positivity. Electronically Signed   By: Inez Catalina M.D.   On: 12/07/2018 20:31

## 2018-12-22 NOTE — Progress Notes (Signed)
Rapid response called on patient due to persistent low sp02 in the 60s and 70s.. Patient on the bedside commode when I arrived. The patient was pale, approximately 45 breaths per minute, labored with persistent dry cough. Placed on 15L HFNC + 15L NRB. Blood pressure stable at 90/53, HR SR in the 90s, sp02 at 67%. Per primary RN this had sustained for >5 min. Dr. Candiss Norse paged by Cristie Hem PCU CN to notify him of the event. Patient put back in bed and asked to prone. Sp02 increased to the 90s. Patient's color returned. Work of breathing decreased and respiratory rate dropped to 28 breaths per minute. Primary RN will continue to monitor and Rapid Response Team will follow-up later today.

## 2018-12-22 NOTE — Progress Notes (Signed)
Remains in bed/prone position. HFNC 10 L and NRB  W/ sats 96-98%. Denies pain, dizziness, shortness of breath. Vs Stable. Continuing to monitor.

## 2018-12-22 NOTE — Progress Notes (Signed)
Notified front desk/charge of need for assistance at bedside due to O2 sats decreasing when up from bed to bedside commode. spo2 70s on 5L HFNC/quickly increased to 15 L HF and NRB and sats remain in 70s and not coming up. BP and HR stable. Denies shortness of breath. Appears fatigued/pale. MD notified. ICU nurse came to bedside. We assisted her back to bed and assist to prone position.  Continuiing to monitor at bedside now.

## 2018-12-22 NOTE — Progress Notes (Signed)
Primary RN notified PCU Charge Nurse that patient was experiencing a panic attack on HFNC 15L and NRB 15L and having difficulty maintaining SPO2.  Requested assistance initiating Rapid Response call and notifying MD.  Rapid initiated and ICU Charge called and en route to see patient.  Attending Dr. Candiss Norse paged via [place] with call back number to primary RN.

## 2018-12-22 NOTE — Progress Notes (Signed)
Updated husband, Shanon Brow via phone. He is very [employer] attorney.

## 2018-12-23 LAB — CBC
HCT: 41.7 % (ref 36.0–46.0)
Hemoglobin: 14 g/dL (ref 12.0–15.0)
MCH: 30.6 pg (ref 26.0–34.0)
MCHC: 33.6 g/dL (ref 30.0–36.0)
MCV: 91 fL (ref 80.0–100.0)
Platelets: 152 10*3/uL (ref 150–400)
RBC: 4.58 MIL/uL (ref 3.87–5.11)
RDW: 12.5 % (ref 11.5–15.5)
WBC: 18.6 10*3/uL — ABNORMAL HIGH (ref 4.0–10.5)
nRBC: 0 % (ref 0.0–0.2)

## 2018-12-23 LAB — GLUCOSE, CAPILLARY
Glucose-Capillary: 149 mg/dL — ABNORMAL HIGH (ref 70–99)
Glucose-Capillary: 193 mg/dL — ABNORMAL HIGH (ref 70–99)
Glucose-Capillary: 147 mg/dL — ABNORMAL HIGH (ref 70–99)
Glucose-Capillary: 130 mg/dL — ABNORMAL HIGH (ref 70–99)

## 2018-12-23 LAB — COMPREHENSIVE METABOLIC PANEL
ALT: 103 U/L — ABNORMAL HIGH (ref 0–44)
AST: 42 U/L — ABNORMAL HIGH (ref 15–41)
Albumin: 2.4 g/dL — ABNORMAL LOW (ref 3.5–5.0)
Alkaline Phosphatase: 60 U/L (ref 38–126)
Anion gap: 11 (ref 5–15)
BUN: 40 mg/dL — ABNORMAL HIGH (ref 8–23)
CO2: 31 mmol/L (ref 22–32)
Calcium: 7.7 mg/dL — ABNORMAL LOW (ref 8.9–10.3)
Chloride: 92 mmol/L — ABNORMAL LOW (ref 98–111)
Creatinine, Ser: 0.78 mg/dL (ref 0.44–1.00)
GFR calc Af Amer: >60 mL/min (ref >60)
GFR calc non Af Amer: >60 mL/min (ref >60)
Glucose, Bld: 192 mg/dL — ABNORMAL HIGH (ref 70–99)
Potassium: 4.1 mmol/L (ref 3.5–5.1)
Sodium: 134 mmol/L — ABNORMAL LOW (ref 135–145)
Total Bilirubin: 1.4 mg/dL — ABNORMAL HIGH (ref 0.3–1.2)
Total Protein: 4.7 g/dL — ABNORMAL LOW (ref 6.5–8.1)

## 2018-12-23 LAB — MAGNESIUM: Magnesium: 2.6 mg/dL — ABNORMAL HIGH (ref 1.7–2.4)

## 2018-12-23 LAB — C-REACTIVE PROTEIN: CRP: 1 mg/dL — ABNORMAL HIGH (ref <1.0)

## 2018-12-23 LAB — D-DIMER, QUANTITATIVE: D-Dimer, Quant: 1.13 ug/mL-FEU — ABNORMAL HIGH (ref 0.00–0.50)

## 2018-12-23 MED ORDER — BISACODYL 10 MG RE SUPP: 10.0000 mg | Freq: Every day | RECTAL | Status: DC | PRN

## 2018-12-23 MED ORDER — METHYLPREDNISOLONE SODIUM SUCC 40 MG IJ SOLR
15.0000 mg | Freq: Two times a day (BID) | INTRAMUSCULAR | Status: DC
  Administered 2018-12-23 – 2018-12-25 (×4): 15.2 mg via INTRAVENOUS
  Filled 2018-12-23 (×4): qty 1

## 2018-12-23 MED ORDER — POLYETHYLENE GLYCOL 3350 17 G PO PACK
17.0000 g | PACK | Freq: Two times a day (BID) | ORAL | Status: DC
  Administered 2018-12-23 – 2018-12-26 (×6): 17 g via ORAL
  Filled 2018-12-23 (×9): qty 1

## 2018-12-23 NOTE — Progress Notes (Signed)
Occupational Therapy Treatment Patient Details Name: Kim Harrison MRN: [id number] DOB: 1955-02-22 Today's Date: 12/23/2018    History of present illness 63 y/o female pt w/ hx of sarcoidosis, cervical cancer, presented to hospital with c/o SOB, hypothyroidism, HLD, fever and chills on 11/01 was treated and dx home with family, 11/06 sx returned and she returned to hospital when she was dx with COVID. Pt admitted to [facility] 11/10 with ARF w/ hypoxia, PNA sec to COVID 19, hypothyroidism and sarcoidosis.   OT comments  Pt with slow progress towards OT goals. Pt agreeable to work with therapies and needing to get to [facility] for toileting needs. Pt initially tolerating mobility well, performing stand pivot to [facility] with minA(+2), pt on 15L HFNC and NRB with O2 sats fluctuating mid 80s-90s post transer, RR 21 and HR stable. When attempting to stand/perform peri-care pt suddenly becoming more unsteady, returned to seated on BSC and pt with blank stare and unresponsive, mild jerking of extremities (?hypotensive vs vasovagal), RN called. During this time pt's O2 sats decreasing as low as mid 60s, RR 24. After approx 1-2 min pt came to, becoming more alert/responsive and sats improving. +2 assist utilized to return pt to EOB/supine in bed, BP 100/49 seated EOB. Once returned to supine pt reports feeling much better, engaging and more alert. End of session trialled pt with bed in chair position, BP initially 97/65, HR 74 and SpO2 96% on HFNC and NRB, after approx 3 min BP 103/60, pt denies dizziness and reports feels okay. Setup with lunch tray while upright. Have updated discharge recommendations given pt slow progress. Feel she may require follow up therapy services prior to return home. Will continue to follow acutely.   Follow Up Recommendations  CIR;Supervision/Assistance - 24 hour(pending progress)    Equipment Recommendations  3 in 1 bedside commode          Precautions / Restrictions  Precautions Precautions: Fall Precaution Comments: O2 desats w/ activity, hx of vasovagal, increased O2 needs Restrictions Weight Bearing Restrictions: No       Mobility Bed Mobility Overal bed mobility: Needs Assistance Bed Mobility: Supine to Sit;Sit to Supine     Supine to sit: Min guard Sit to supine: Mod assist;+2 for physical assistance;+2 for safety/equipment   General bed mobility comments: increased time to transition to EOB, modA+2 to return to supine given recent episode  Transfers Overall transfer level: Needs assistance Equipment used: 1 person hand held assist;2 person hand held assist Transfers: Sit to/from [employer] Pivot Transfers Sit to Stand: Min assist;+2 safety/equipment;+2 physical assistance Stand pivot transfers: Min assist;+2 physical assistance;+2 safety/equipment Squat pivot transfers: Max assist;+2 physical assistance;+2 safety/equipment     General transfer comment: minA+2 initially to [facility], pt with ?hypotensive vs vasovagal episode during pericare post toileting, ultimately requires +36maxA for pivot back to EOB from [facility]     Balance Overall balance assessment: Needs assistance Sitting-balance support: Feet unsupported Sitting balance-Leahy Scale: Fair     Standing balance support: Single extremity supported;Bilateral upper extremity supported;During functional activity Standing balance-Leahy Scale: Poor Standing balance comment: reliant on external assist                           ADL either performed or assessed with clinical judgement   ADL Overall ADL's : Needs assistance/impaired Eating/Feeding: Set up;Sitting Eating/Feeding Details (indicate cue type and reason): assist to cut up food  Toilet Transfer: Minimal assistance;Maximal assistance;+2 for physical assistance;+2 for safety/equipment;Stand-pivot;BSC Toilet Transfer Details (indicate cue type and reason): minA (+2) for  tranfser to [facility], maxA+2 for transfer back to EOB due to pt had vasovagal episode Toileting- Clothing Manipulation and Hygiene: Maximal assistance;+2 for physical assistance;+2 for safety/equipment;Sit to/from stand Toileting - Clothing Manipulation Details (indicate cue type and reason): assist for pericare in standing     Functional mobility during ADLs: Minimal assistance;+2 for physical assistance;+2 for safety/equipment(HHA) General ADL Comments: pt with ?hypotensive vs vasovagal episode during completion of peri-care post toileting, pt with blank stare, mild jerking of extremities, and color leaving face. given time in sitting pt became more alert/responsive to therapists, assisted back to bed and to supine to further assess. pt significantly improved by end of session, color back to face, denied dizziness, VSS end of session; RN made aware and in to assess pt by end of session                       Cognition Arousal/Alertness: Awake/alert Behavior During Therapy: [facility] for tasks assessed/performed;Anxious Overall Cognitive Status: Within Functional Limits for tasks assessed                                 General Comments: appears less anxious today than previous reported        Exercises     Shoulder Instructions       General Comments      Pertinent Vitals/ Pain       Pain Assessment: No/denies pain  Home Living                                          Prior Functioning/Environment              Frequency  Min 2X/week(will benefit from 3x if able)        Progress Toward Goals  OT Goals(current goals can now be found in the care plan section)  Progress towards OT goals: Progressing toward goals  Acute Rehab OT Goals Patient Stated Goal: motivated to keep getting better OT Goal Formulation: With patient Time For Goal Achievement: 12/27/18 Potential to Achieve Goals: Good ADL Goals Pt Will Perform Grooming: with  supervision;sitting Pt Will Perform Lower Body Bathing: with supervision;sitting/lateral leans;sit to/from stand Pt Will Perform Lower Body Dressing: with supervision;sitting/lateral leans;sit to/from stand Pt Will Transfer to Toilet: with supervision;ambulating;stand pivot transfer Pt Will Perform Toileting - Clothing Manipulation and hygiene: with supervision;sit to/from stand Additional ADL Goal #1: Pt will tolerate EOB/OOB ADL >7 min with O2 sats maintaining >90%.  Plan Discharge plan needs to be updated    Co-evaluation    PT/OT/SLP Co-Evaluation/Treatment: Yes Reason for Co-Treatment: For patient/therapist safety   OT goals addressed during session: ADL's and self-care      AM-PAC OT "6 Clicks" Daily Activity     Outcome Measure   Help from another person eating meals?: A Little Help from another person taking care of personal grooming?: A Little Help from another person toileting, which includes using toliet, bedpan, or urinal?: A Lot Help from another person bathing (including washing, rinsing, drying)?: A Lot Help from another person to put on and taking off regular upper body clothing?: A Lot Help from another person to put on and taking off regular lower  body clothing?: A Lot 6 Click Score: 14    End of Session Equipment Utilized During Treatment: Oxygen  OT Visit Diagnosis: Muscle weakness (generalized) (M62.81);Unsteadiness on feet (R26.81);Other (comment)(decreased activity tolerance)   Activity Tolerance Treatment limited secondary to medical complications (Comment);Patient tolerated treatment well(hypotensive ?vasovagal)   Patient Left in bed;with call bell/phone within reach   Nurse Communication Mobility status        Time: 1150-1230 OT Time Calculation (min): 40 min  Charges: OT General Charges $OT Visit: 1 Visit OT Treatments $Self Care/Home Management : 23-37 mins   Lou Cal, OT Supplemental Rehabilitation Services Pager  [phone] Office [phone]    Raymondo Band 12/23/2018, 1:40 PM

## 2018-12-23 NOTE — Plan of Care (Addendum)
See flowsheet attempt to disimpact pt twice, only able to retrieve a small amount. Stool very soft. Encouraged pt to increase fluid intake. Educated spouse on pt's status. Writer encouraged pt to continue breathing exercise.  Problem: Education: Goal: Knowledge of risk factors and measures for prevention of condition will improve Outcome: Progressing   Problem: Coping: Goal: Psychosocial and spiritual needs will be supported Outcome: Progressing   Problem: Respiratory: Goal: Will maintain a patent airway Outcome: Progressing Goal: Complications related to the disease process, condition or treatment will be avoided or minimized Outcome: Progressing   Problem: Education: Goal: Knowledge of General Education information will improve Description: Including pain rating scale, medication(s)/side effects and non-pharmacologic comfort measures Outcome: Progressing   Problem: Health Behavior/Discharge Planning: Goal: Ability to manage health-related needs will improve Outcome: Progressing   Problem: Clinical Measurements: Goal: Ability to maintain clinical measurements within normal limits will improve Outcome: Progressing Goal: Will remain free from infection Outcome: Progressing Goal: Diagnostic test results will improve Outcome: Progressing Goal: Respiratory complications will improve Outcome: Progressing Goal: Cardiovascular complication will be avoided Outcome: Progressing   Problem: Nutrition: Goal: Adequate nutrition will be maintained Outcome: Progressing   Problem: Coping: Goal: Level of anxiety will decrease Outcome: Progressing   Problem: Elimination: Goal: Will not experience complications related to bowel motility Outcome: Progressing Goal: Will not experience complications related to urinary retention Outcome: Progressing   Problem: Pain Managment: Goal: General experience of comfort will improve Outcome: Progressing   Problem: Safety: Goal: Ability to  remain free from injury will improve Outcome: Progressing   Problem: Skin Integrity: Goal: Risk for impaired skin integrity will decrease Outcome: Progressing

## 2018-12-23 NOTE — Plan of Care (Signed)

## 2018-12-23 NOTE — Progress Notes (Signed)
A/O, no noted distress. C/O discomfort unable to have BM, Bowel sounds active. Notified MD.

## 2018-12-23 NOTE — Progress Notes (Addendum)
PROGRESS NOTE    Kim Harrison  [id number] DOB: [birth year] DOA: 12/01/2018 PCP: Lou Miner, MD    Brief Narrative:  63 year old female who presented with dyspnea.  She does have significant past medical history of sarcoidosis, hypothyroidism, and dyslipidemia.  She reported chills for about 10 days, then she tested negative for COVID-19 (11/29/18), she received antibiotics and steroids with mild improvement of her symptoms.  6 days later she had recurrent symptoms, she was seen at [facility] ED and tested positive for COVID-19.  She was treated as an outpatient with prednisone with no improvement of her symptoms, now with worsening dyspnea associated with generalized weakness, fevers and chills..  3 days later on November 9, she was admitted to the hospital.  On her initial physical examination blood pressure 116/73, pulse rate 79, respiratory rate 20, oxygen saturation 93%.  Her lungs had no rhonchi or rales, heart S1-S2 present, abdomen soft, no lower extremity edema.  Her chest radiograph had interstitial infiltrates, lower zone of right lobe and left lower lobe.  EKG 73 bpm, left axis deviation, left anterior fascicular block, right bundle branch block, sinus rhythm, no significant ST segment or T wave changes.  Patient was admitted to the hospital with a working diagnosis of acute hypoxic respiratory failure due to SARS COVID-19 viral pneumonia.  Patient had worsening hypoxic respiratory failure despite treatment with systemic corticosteroids, remdesivir, Actemra x2, and convalescent plasma.   She continued to have high oxygen requirements, 15 L per high flow nasal cannula along with nonrebreather mask.  Further work-up with CT chest showed worsening groundglass opacities with septal thickening.  Negative for pulmonary embolism.  Her hospitalization has been complicated by severe anxiety and panic attacks.  Slowly progressing, today down to 6 LPM per non rebreather mask,  with good toleration.   Assessment & Plan:   Principal Problem:   Acute respiratory failure with hypoxia (HCC) Active Problems:   Pneumonia due to COVID-19 virus   Hypothyroidism   Sarcoidosis   1.  Acute hypoxic respiratory failure due to SARS COVID-19 viral pneumonia.  RR: 17  Pulse oxymetry:90 to 93%  Fi02: 6 LPM per non rebreather mask.  [place]    12/20/18 1635 12/21/18 0345 12/22/18 0246 12/23/18 0113  DDIMER 1.04* 0.96* 1.90* 1.13*  FERRITIN 231 202 228  --   CRP 1.2* <0.8 <0.8 1.0*    No results found for: SARSCOV2NAA  Inflammatory markers are getting better.   Patient has completed medical therapy with for viral pneumonia, imaging personally reviewed noted significant lung injury due SARS COVD 19, will continue to taper down supplemental oxygen, target 02 saturation grater than 88%.)  Continue antitussive agents, airway clearing techniques with flutter valve and incentive spirometer. Out of bed as tolerated and physical therapy.  Reactive leukocytosis, no signs of bacterial infection, will continue to hold on antibiotic therapy, check cbc in 48 H.   Continue with slow steroid taper.   2. Severe anxiety and panic attacks. Will continue neuro checks per unit protocol, today is more calm and able to sleep. Continue medical therapy with lexapro, clonazepam and as needed alprazolam.   3. Dyslipidemia. Continue with statin therapy.   4. Hypothyroid. Continue with levothyroxine   5. Hypokalemia and hypochloremia/ metabolic alkalosis. Stable renal function with serum cr at 0.78 with K at 4,1 and serum bicarbonate at 31. Hold on further diuresis, continue to encourage po intake. DC IV fluids for now.   6. Sarcoidosis. Stable with no signs  of exacerbation.    DVT prophylaxis: enoxaparin   Code Status:  full Family Communication: I spoke over the phone with the patient's husband about patient's  condition, plan of care and all questions were  addressed. Disposition Plan/ discharge barriers: transfer to telemetry.   Body mass index is 22.23 kg/m. Malnutrition Type:  Nutrition Problem: Increased nutrient needs Etiology: acute illness(COVID 19 PNA)   Malnutrition Characteristics:  Signs/Symptoms: estimated needs   Nutrition Interventions:  Interventions: Ensure Enlive (each supplement provides 350kcal and 20 grams of protein), Hormel Shake, Magic cup  RN Pressure Injury Documentation:     Consultants:     Procedures:     Antimicrobials:       Subjective: Patient has able to sleep better last night, her dyspnea is stable, but continue to be very weak and deconditioned, no nausea or vomiting.   Objective: Vitals:   12/22/18 1600 12/22/18 1929 12/23/18 0350 12/23/18 0725  BP: 100/69 91/61 94/60  103/65  Pulse: 81  78 77  Resp: 18  17   Temp: 98.1 F (36.7 C) 97.8 F (36.6 C) 97.7 F (36.5 C) 98.3 F (36.8 C)  TempSrc: Oral Oral Oral Axillary  SpO2: 99%  90% 93%  Weight:      Height:        Intake/Output Summary (Last 24 hours) at 12/23/2018 0833 Last data filed at 12/23/2018 0300 Gross per 24 hour  Intake 1299.29 ml  Output 400 ml  Net 899.29 ml   Filed Weights   12/15/18 0600 12/16/18 0500 12/18/18 0700  Weight: 62.9 kg 63 kg 60.6 kg    Examination:   General: deconditioned and ill looking appearing.  Neurology: Awake and alert, non focal  E ENT: mild pallor, no icterus, oral mucosa moist Cardiovascular: No JVD. S1-S2 present, rhythmic, no gallops, rubs, or murmurs. No lower extremity edema. Pulmonary: positive breath sounds bilaterally. Gastrointestinal. Abdomen with no organomegaly, non tender, no rebound or guarding Skin. No rashes Musculoskeletal: no joint deformities     Data Reviewed: I have personally reviewed following labs and imaging studies  CBC: Recent Labs  Lab 12/19/18 0515 12/20/18 1635 12/21/18 0345 12/22/18 0246 12/23/18 0113  WBC 20.8* 21.4* 21.5*  18.1* 18.6*  HGB 16.3* 15.9* 16.0* 15.6* 14.0  HCT 47.7* 46.6* 46.9* 45.6 41.7  MCV 90.0 88.9 88.8 89.4 91.0  PLT 280 234 231 197 0000000   Basic Metabolic Panel: Recent Labs  Lab 12/17/18 0715 12/18/18 0532 12/19/18 0515 12/20/18 1635 12/21/18 0345 12/22/18 0246 12/23/18 0113  NA 136 135 135 131* 135 133* 134*  K 4.7 4.3 4.0 3.3* 3.4* 3.3* 4.1  CL 102 92* 88* 83* 83* 83* 92*  CO2 26 31 33* 33* 36* 35* 31  GLUCOSE 206* 260* 222* 297* 149* 177* 192*  BUN 36* 43* 46* 48* 44* 40* 40*  CREATININE 0.84 1.06* 1.17* 1.20* 1.10* 0.99 0.78  CALCIUM 8.2* 8.6* 8.7* 8.3* 8.2* 8.5* 7.7*  MG 2.4 2.6* 2.8*  --   --   --  2.6*  PHOS  --  4.4  --   --   --   --   --    GFR: Estimated Creatinine Clearance: 63.9 mL/min (by C-G formula based on SCr of 0.78 mg/dL). Liver Function Tests: Recent Labs  Lab 12/20/18 1635 12/21/18 0345 12/22/18 0246 12/23/18 0113  AST 46* 33 32 42*  ALT 161* 141* 109* 103*  ALKPHOS 63 68 62 60  BILITOT 1.0 1.5* 1.1 1.4*  PROT 5.9* 5.9* 5.3* 4.7*  ALBUMIN 2.8* 3.1* 2.6* 2.4*   No results for input(s): LIPASE, AMYLASE in the last 168 hours. No results for input(s): AMMONIA in the last 168 hours. Coagulation Profile: No results for input(s): INR, PROTIME in the last 168 hours. Cardiac Enzymes: No results for input(s): CKTOTAL, CKMB, CKMBINDEX, TROPONINI in the last 168 hours. BNP (last 3 results) No results for input(s): PROBNP in the last 8760 hours. HbA1C: No results for input(s): HGBA1C in the last 72 hours. CBG: Recent Labs  Lab 12/22/18 0819 12/22/18 1104 12/22/18 1631 12/22/18 2115 12/23/18 0724  GLUCAP 162* 228* 151* 172* 149*   Lipid Profile: No results for input(s): CHOL, HDL, LDLCALC, TRIG, CHOLHDL, LDLDIRECT in the last 72 hours. Thyroid Function Tests: No results for input(s): TSH, T4TOTAL, FREET4, T3FREE, THYROIDAB in the last 72 hours. Anemia Panel: Recent Labs    12/21/18 0345 12/22/18 0246  FERRITIN 202 5      Radiology  Studies: I have reviewed all of the imaging during this hospital visit personally     Scheduled Meds: . Chlorhexidine Gluconate Cloth  6 each Topical Daily  . cholecalciferol  1,000 Units Oral Daily  . clonazePAM  1 mg Oral BID  . docusate sodium  100 mg Oral BID  . escitalopram  10 mg Oral Daily  . feeding supplement (ENSURE ENLIVE)  237 mL Oral TID WC  . fondaparinux (ARIXTRA) injection  2.5 mg Subcutaneous Q0600  . insulin aspart  0-20 Units Subcutaneous TID WC  . insulin aspart  0-5 Units Subcutaneous QHS  . levothyroxine  75 mcg Oral Daily  . mouth rinse  15 mL Mouth Rinse BID  . methylPREDNISolone (SOLU-MEDROL) injection  15.2 mg Intravenous Q12H  . pantoprazole  40 mg Oral Daily  . polyethylene glycol  17 g Oral Daily  . vitamin C  500 mg Oral Daily  . zinc sulfate  220 mg Oral Daily   Continuous Infusions: . sodium chloride 100 mL/hr at 12/23/18 0038     LOS: 13 days        Alexander Aument Gerome Apley, MD

## 2018-12-23 NOTE — Progress Notes (Signed)
Physical Therapy Treatment Patient Details Name: Kim Harrison MRN: [id number] DOB: 10/20/1955 Today's Date: 12/23/2018    History of Present Illness 63 y/o female pt w/ hx of sarcoidosis, cervical cancer, presented to hospital with c/o SOB, hypothyroidism, HLD, fever and chills on 11/01 was treated and dx home with family, 11/06 sx returned and she returned to hospital when she was dx with COVID. Pt admitted to [facility] 11/10 with ARF w/ hypoxia, PNA sec to COVID 19, hypothyroidism and sarcoidosis.    PT Comments      Pt agreeable to work with therapies and needing to get to [facility] for toileting needs. Pt initially tolerating mobility well, performing stand pivot to [facility] with minA(+2), pt on 15L HFNC and NRB with O2 sats fluctuating mid 80s-90s post transer, RR 21 and HR stable. When attempting to stand/perform peri-care pt suddenly becoming more unsteady, returned to seated on BSC and pt with blank stare and unresponsive, mild jerking of extremities (?hypotensive vs vasovagal), RN called. During this time pt's O2 sats decreasing as low as mid 60s, RR 24. After approx 1-2 min pt came to, becoming more alert/responsive and sats improving. +2 assist utilized to return pt to EOB/supine in bed, BP 100/49 seated EOB. Once returned to supine pt reports feeling much better, engaging and more alert. End of session trialled pt with bed in chair position, BP initially 97/65, HR 74 and SpO2 96% on HFNC and NRB, after approx 3 min BP 103/60, pt denies dizziness and reports feels better and back to alert and oriented. Patient is  Making slow progress due to respiratory and syncopal episodes/ Patient does report that she  Is liable to pass out PTA.  Follow Up Recommendations  Home health PT;CIR     Equipment Recommendations  None recommended by PT    Recommendations for Other Services Rehab consult;OT consult     Precautions / Restrictions Precautions Precautions: Fall Precaution Comments: O2 desats w/  activity, hx of vasovagal, increased O2 needs Restrictions Weight Bearing Restrictions: No    Mobility  Bed Mobility Overal bed mobility: Needs Assistance Bed Mobility: Supine to Sit;Sit to Supine Rolling: Supervision   Supine to sit: Min guard Sit to supine: Mod assist;+2 for physical assistance;+2 for safety/equipment   General bed mobility comments: increased time to transition to EOB, modA+2 to return to supine given recent episode  Transfers Overall transfer level: Needs assistance Equipment used: 1 person hand held assist;2 person hand held assist Transfers: Sit to/from [employer] Pivot Transfers Sit to Stand: Min assist;+2 safety/equipment;+2 physical assistance Stand pivot transfers: Min assist;+2 physical assistance;+2 safety/equipment Squat pivot transfers: Max assist;+2 physical assistance;+2 safety/equipment     General transfer comment: minA+2 initially to [facility], pt with ?hypotensive vs vasovagal episode during pericare post toileting, ultimately requires +44maxA for pivot back to EOB from [facility]   Ambulation/Gait                 Stairs             Wheelchair Mobility    Modified Rankin (Stroke Patients Only)       Balance Overall balance assessment: Needs assistance Sitting-balance support: Feet unsupported Sitting balance-Leahy Scale: Fair     Standing balance support: Single extremity supported;Bilateral upper extremity supported;During functional activity Standing balance-Leahy Scale: Poor Standing balance comment: reliant on external assist  Cognition Arousal/Alertness: Awake/alert Behavior During Therapy: WFL for tasks assessed/performed;Anxious Overall Cognitive Status: Within Functional Limits for tasks assessed                                 General Comments: appears less anxious today than previous reported      Exercises      General Comments         Pertinent Vitals/Pain Pain Assessment: No/denies pain    Home Living                      Prior Function            PT Goals (current goals can now be found in the care plan section) Acute Rehab PT Goals Patient Stated Goal: motivated to keep getting better Progress towards PT goals: Not progressing toward goals - comment(continues with issues with BP and require HFNC and NRB)    Frequency    Min 3X/week      PT Plan Current plan remains appropriate    Co-evaluation PT/OT/SLP Co-Evaluation/Treatment: Yes Reason for Co-Treatment: Complexity of the patient's impairments (multi-system involvement);For patient/therapist safety PT goals addressed during session: Mobility/safety with mobility OT goals addressed during session: ADL's and self-care      AM-PAC PT "6 Clicks" Mobility   Outcome Measure  Help needed turning from your back to your side while in a flat bed without using bedrails?: A Little Help needed moving from lying on your back to sitting on the side of a flat bed without using bedrails?: A Little Help needed moving to and from a bed to a chair (including a wheelchair)?: A Lot Help needed standing up from a chair using your arms (e.g., wheelchair or bedside chair)?: A Lot Help needed to walk in hospital room?: Total Help needed climbing 3-5 steps with a railing? : Total 6 Click Score: 12    End of Session Equipment Utilized During Treatment: Oxygen   Patient left: in bed;with nursing/sitter in room Nurse Communication: Mobility status PT Visit Diagnosis: Other abnormalities of gait and mobility (R26.89);Muscle weakness (generalized) (M62.81)     Time: [id number] PT Time Calculation (min) (ACUTE ONLY): 42 min  Charges:  $Therapeutic Activity: 8-22 mins                     Tresa Endo PT Acute Rehabilitation Services  Office [phone]    [place], [place] 12/23/2018, 5:15 PM

## 2018-12-24 DIAGNOSIS — R0603 Acute respiratory distress: Secondary | ICD-10-CM

## 2018-12-24 LAB — COMPREHENSIVE METABOLIC PANEL
ALT: 94 U/L — ABNORMAL HIGH (ref 0–44)
AST: 34 U/L (ref 15–41)
Albumin: 2.2 g/dL — ABNORMAL LOW (ref 3.5–5.0)
Alkaline Phosphatase: 46 U/L (ref 38–126)
Anion gap: 9 (ref 5–15)
BUN: 34 mg/dL — ABNORMAL HIGH (ref 8–23)
CO2: 30 mmol/L (ref 22–32)
Calcium: 7.6 mg/dL — ABNORMAL LOW (ref 8.9–10.3)
Chloride: 96 mmol/L — ABNORMAL LOW (ref 98–111)
Creatinine, Ser: 0.78 mg/dL (ref 0.44–1.00)
GFR calc Af Amer: >60 mL/min (ref >60)
GFR calc non Af Amer: >60 mL/min (ref >60)
Glucose, Bld: 150 mg/dL — ABNORMAL HIGH (ref 70–99)
Potassium: 4.2 mmol/L (ref 3.5–5.1)
Sodium: 135 mmol/L (ref 135–145)
Total Bilirubin: 0.7 mg/dL (ref 0.3–1.2)
Total Protein: 4.4 g/dL — ABNORMAL LOW (ref 6.5–8.1)

## 2018-12-24 LAB — GLUCOSE, CAPILLARY
Glucose-Capillary: 114 mg/dL — ABNORMAL HIGH (ref 70–99)
Glucose-Capillary: 120 mg/dL — ABNORMAL HIGH (ref 70–99)
Glucose-Capillary: 198 mg/dL — ABNORMAL HIGH (ref 70–99)
Glucose-Capillary: 138 mg/dL — ABNORMAL HIGH (ref 70–99)
Glucose-Capillary: 121 mg/dL — ABNORMAL HIGH (ref 70–99)

## 2018-12-24 MED ORDER — FLEET ENEMA 7-19 GM/118ML RE ENEM
1.0000 | ENEMA | Freq: Once | RECTAL | Status: AC
  Administered 2018-12-24: 1 via RECTAL
  Filled 2018-12-24: qty 1

## 2018-12-24 MED ORDER — BISACODYL 5 MG PO TBEC
10.0000 mg | DELAYED_RELEASE_TABLET | Freq: Once | ORAL | Status: AC
  Administered 2018-12-24: 10 mg via ORAL
  Filled 2018-12-24: qty 2

## 2018-12-24 MED ORDER — MAGNESIUM CITRATE PO SOLN
0.5000 | Freq: Once | ORAL | Status: AC
  Administered 2018-12-24: 0.5 via ORAL
  Filled 2018-12-24: qty 296

## 2018-12-24 MED ORDER — MAGNESIUM CITRATE PO SOLN
0.5000 | Freq: Once | ORAL | Status: AC
  Administered 2018-12-24: 0.5 via ORAL

## 2018-12-24 NOTE — TOC Transition Note (Signed)
Transition of Care [facility]) - CM/SW Discharge Note   Patient Details  Name: Kim Harrison MRN: [id number] Date of Birth: 1955-02-26  [facility] CM/SW Contact:  Ninfa Meeker, RN Phone Number: [phone] (working remotely) 12/24/2018, 1:41 PM   Clinical Narrative:   Case manager contacted patient's husband Shanon Brow, as requested to discuss discharge plan. Mr. Clippard doesn't want his wife to go to SNF if at all possible. States he wants to know exactly what her coming home looks like. He is willing to pay for private care if needed. Case manager explained about the quarantine time period before outsiders will come in. Also explained that we have not been able to find a Home Health agency that will accept BC/BS. Case manager has reached out to therapist and MD requesting they contact patient to give him more insight into what his wife may need should she go home. CM will continue to monitor for disposition. Patient will remain in hospital until Friday at least per MD.      Final next level of care: [place] Barriers to Discharge: Continued Medical Work up   Patient Goals and CMS Choice Patient states their goals for this hospitalization and ongoing recovery are:: "to get stronger"   Choice offered to / list presented to : Patient  Discharge Placement                       Discharge Plan and Services In-house Referral: NA Discharge Planning Services: CM Consult Post Acute Care Choice: Home Health          DME Arranged: 3-N-1 DME Agency: [facility] Date DME Agency Contacted: 12/23/18 Time DME Agency Contacted: [phone] Representative spoke with at DME Agency: Magda Paganini HH Arranged: PT, OT   Date [place] Agency Contacted: 12/23/18      Social Determinants of Health (SDOH) Interventions     Readmission Risk Interventions No flowsheet data found.

## 2018-12-24 NOTE — Progress Notes (Signed)
PROGRESS NOTE    Kim Harrison  [id number] DOB: 03/22/1955 DOA: 12/10/2018 PCP: Lou Miner, MD    Brief Narrative:  63 year old female who presented with dyspnea.  She does have significant past medical history of sarcoidosis, hypothyroidism, and dyslipidemia.  She reported chills for about 10 days, then she tested negative for COVID-19 (11/29/18), she received antibiotics and steroids with mild improvement of her symptoms.  6 days later she had recurrent symptoms, she was seen at [facility] ED and tested positive for COVID-19.  She was treated as an outpatient with prednisone with no improvement of her symptoms, now with worsening dyspnea associated with generalized weakness, fevers and chills..  3 days later on November 9, she was admitted to the hospital.  On her initial physical examination blood pressure 116/73, pulse rate 79, respiratory rate 20, oxygen saturation 93%.  Her lungs had no rhonchi or rales, heart S1-S2 present, abdomen soft, no lower extremity edema.  Her chest radiograph had interstitial infiltrates, lower zone of right lobe and left lower lobe.  EKG 73 bpm, left axis deviation, left anterior fascicular block, right bundle branch block, sinus rhythm, no significant ST segment or T wave changes.  Patient was admitted to the hospital with a working diagnosis of acute hypoxic respiratory failure due to SARS COVID-19 viral pneumonia.  Patient had worsening hypoxic respiratory failure despite treatment with systemic corticosteroids, remdesivir, Actemra x2, and convalescent plasma.   She continued to have high oxygen requirements, 15 L per high flow nasal cannula along with nonrebreather mask.  Further work-up with CT chest showed worsening groundglass opacities with septal thickening.  Negative for pulmonary embolism.  Her hospitalization has been complicated by severe anxiety and panic attacks.  Slowly progressing, today down to 6 LPM per non rebreather  mask, with good toleration   Assessment & Plan:   Principal Problem:   Acute respiratory failure with hypoxia (HCC) Active Problems:   Pneumonia due to COVID-19 virus   Hypothyroidism   Sarcoidosis   1.  Acute hypoxic respiratory failure due to SARS COVID-19 viral pneumonia. Patient has completed medical therapy with for viral pneumonia, imaging personally reviewed noted significant lung injury due SARS COVD 19.  RR: 18  Pulse oxymetry: 99 Fi02: 3 LPM per HFNC  Patient with improved oxygen requirements, down to 3 LPM per HFNC, will continue with antitussive agents, bronchodilators, and airway clearing techniques with flutter valve - incentive spirometer.    Continue with slow steroid taper.   2. Severe anxiety and panic attacks. Tolerating well lexapro, clonazepam and as needed alprazolam.   3. Dyslipidemia. On statin therapy.   4. Hypothyroid. On levothyroxine   5. Hypokalemia and hypochloremia/ metabolic alkalosis. Stable renal function, with serum cr at 0,78, K at 4,2 and serum bicarbonate at 30. Will continue to follow renal function, patient is tolerating po well.  6. Sarcoidosis.  No signs of exacerbation.    DVT prophylaxis: enoxaparin   Code Status:  full Family Communication: I spoke over the phone with the patient's husband about patient's  condition, plan of care and all questions were addressed. Disposition Plan/ discharge barriers: transfer to telemetry.      Body mass index is 22.23 kg/m. Malnutrition Type:  Nutrition Problem: Increased nutrient needs Etiology: acute illness(COVID 19 PNA)   Malnutrition Characteristics:  Signs/Symptoms: estimated needs   Nutrition Interventions:  Interventions: Ensure Enlive (each supplement provides 350kcal and 20 grams of protein), Hormel Shake, Magic cup  RN Pressure Injury Documentation:     Consultants:  Procedures:     Antimicrobials:       Subjective: Patient is feeling  better today, her dyspnea has been improving, but continue to have symptoms with minimal efforts. Had decreased oxygen requirements this am.   Objective: Vitals:   12/23/18 1116 12/23/18 1625 12/23/18 2015 12/24/18 0413  BP: 95/72 98/63 (!) 91/55 90/60  Pulse: 79     Resp:      Temp: (!) 97.5 F (36.4 C) 98 F (36.7 C) 97.8 F (36.6 C) 98.8 F (37.1 C)  TempSrc: Axillary Oral Oral Oral  SpO2: 98% 94%    Weight:      Height:        Intake/Output Summary (Last 24 hours) at 12/24/2018 0743 Last data filed at 12/23/2018 0900 Gross per 24 hour  Intake 120 ml  Output -  Net 120 ml   Filed Weights   12/15/18 0600 12/16/18 0500 12/18/18 0700  Weight: 62.9 kg 63 kg 60.6 kg    Examination:   General: positive dyspnea, deconditioned and ill looking appearing.  Neurology: Awake and alert, non focal  E ENT: mild pallor, no icterus, oral mucosa moist Cardiovascular: No JVD. S1-S2 present, rhythmic, no gallops, rubs, or murmurs. No lower extremity edema. Pulmonary: positive breath sounds bilaterally. Gastrointestinal. Abdomen with no organomegaly, non tender, no rebound or guarding Skin. No rashes Musculoskeletal: no joint deformities     Data Reviewed: I have personally reviewed following labs and imaging studies  CBC: Recent Labs  Lab 12/19/18 0515 12/20/18 1635 12/21/18 0345 12/22/18 0246 12/23/18 0113  WBC 20.8* 21.4* 21.5* 18.1* 18.6*  HGB 16.3* 15.9* 16.0* 15.6* 14.0  HCT 47.7* 46.6* 46.9* 45.6 41.7  MCV 90.0 88.9 88.8 89.4 91.0  PLT 280 234 231 197 0000000   Basic Metabolic Panel: Recent Labs  Lab 12/18/18 0532 12/19/18 0515 12/20/18 1635 12/21/18 0345 12/22/18 0246 12/23/18 0113 12/24/18 0236  NA 135 135 131* 135 133* 134* 135  K 4.3 4.0 3.3* 3.4* 3.3* 4.1 4.2  CL 92* 88* 83* 83* 83* 92* 96*  CO2 31 33* 33* 36* 35* 31 30  GLUCOSE 260* 222* 297* 149* 177* 192* 150*  BUN 43* 46* 48* 44* 40* 40* 34*  CREATININE 1.06* 1.17* 1.20* 1.10* 0.99 0.78 0.78   CALCIUM 8.6* 8.7* 8.3* 8.2* 8.5* 7.7* 7.6*  MG 2.6* 2.8*  --   --   --  2.6*  --   PHOS 4.4  --   --   --   --   --   --    GFR: Estimated Creatinine Clearance: 63.9 mL/min (by C-G formula based on SCr of 0.78 mg/dL). Liver Function Tests: Recent Labs  Lab 12/20/18 1635 12/21/18 0345 12/22/18 0246 12/23/18 0113 12/24/18 0236  AST 46* 33 32 42* 34  ALT 161* 141* 109* 103* 94*  ALKPHOS 63 68 62 60 46  BILITOT 1.0 1.5* 1.1 1.4* 0.7  PROT 5.9* 5.9* 5.3* 4.7* 4.4*  ALBUMIN 2.8* 3.1* 2.6* 2.4* 2.2*   No results for input(s): LIPASE, AMYLASE in the last 168 hours. No results for input(s): AMMONIA in the last 168 hours. Coagulation Profile: No results for input(s): INR, PROTIME in the last 168 hours. Cardiac Enzymes: No results for input(s): CKTOTAL, CKMB, CKMBINDEX, TROPONINI in the last 168 hours. BNP (last 3 results) No results for input(s): PROBNP in the last 8760 hours. HbA1C: No results for input(s): HGBA1C in the last 72 hours. CBG: Recent Labs  Lab 12/23/18 0724 12/23/18 1115 12/23/18 1624 12/23/18 2050  12/23/18 2119  GLUCAP 149* 193* 147* 130* 114*   Lipid Profile: No results for input(s): CHOL, HDL, LDLCALC, TRIG, CHOLHDL, LDLDIRECT in the last 72 hours. Thyroid Function Tests: No results for input(s): TSH, T4TOTAL, FREET4, T3FREE, THYROIDAB in the last 72 hours. Anemia Panel: Recent Labs    12/22/18 0246  FERRITIN 36      Radiology Studies: I have reviewed all of the imaging during this hospital visit personally     Scheduled Meds: . Chlorhexidine Gluconate Cloth  6 each Topical Daily  . cholecalciferol  1,000 Units Oral Daily  . clonazePAM  1 mg Oral BID  . docusate sodium  100 mg Oral BID  . escitalopram  10 mg Oral Daily  . feeding supplement (ENSURE ENLIVE)  237 mL Oral TID WC  . fondaparinux (ARIXTRA) injection  2.5 mg Subcutaneous Q0600  . insulin aspart  0-20 Units Subcutaneous TID WC  . insulin aspart  0-5 Units Subcutaneous QHS  .  levothyroxine  75 mcg Oral Daily  . mouth rinse  15 mL Mouth Rinse BID  . methylPREDNISolone (SOLU-MEDROL) injection  15.2 mg Intravenous Q12H  . pantoprazole  40 mg Oral Daily  . polyethylene glycol  17 g Oral BID  . vitamin C  500 mg Oral Daily  . zinc sulfate  220 mg Oral Daily   Continuous Infusions:   LOS: 14 days        Mauricio Gerome Apley, MD

## 2018-12-24 NOTE — Progress Notes (Signed)
Spoke with patients spouse on phone for 10 minutes on plan of care regarding discharge. Husband is adamant on getting her daily small goals to accomplish for an "exit strategy". Reached out to PT/OT and they agreed this is a good idea for patient and her needs.    Agreed upon goals for today with patient, RN and PT is: -No NRB when not clinically necessary. -Have a BM  -Sit on side of bed to wash up and change linens.  End of week goal: Up in chair for 2 meals  Husband is worried about patient's lack of motivation and wants RN staff to continue small daily goals with patient. He wants to know a "plan" to get paitent ready for discharge. Reassured him that she is receiving the care and different therapies she needs to heal. No further questions at this time.

## 2018-12-24 NOTE — Progress Notes (Signed)
See flowsheets able to wean to 3L HFNC while sleep. Updated spouse of pt's status. Pt has not had a BM in "two weeks," noted by pt. Encouraged pt to increase fluid intake. Encouraged pt to use the Incentive as much as she can and other breathing techniques, she agreed she would try.

## 2018-12-24 NOTE — Plan of Care (Signed)
  Problem: Education: Goal: Knowledge of risk factors and measures for prevention of condition will improve Outcome: Progressing   Problem: Coping: Goal: Psychosocial and spiritual needs will be supported Outcome: Progressing   Problem: Respiratory: Goal: Will maintain a patent airway Outcome: Progressing Goal: Complications related to the disease process, condition or treatment will be avoided or minimized Outcome: Progressing   Problem: Education: Goal: Knowledge of General Education information will improve Description: Including pain rating scale, medication(s)/side effects and non-pharmacologic comfort measures Outcome: Progressing   Problem: Clinical Measurements: Goal: Ability to maintain clinical measurements within normal limits will improve Outcome: Progressing Goal: Will remain free from infection Outcome: Progressing Goal: Diagnostic test results will improve Outcome: Progressing Goal: Respiratory complications will improve Outcome: Progressing Goal: Cardiovascular complication will be avoided Outcome: Progressing   Problem: Activity: Goal: Risk for activity intolerance will decrease Outcome: Progressing   Problem: Elimination: Goal: Will not experience complications related to bowel motility Outcome: Progressing Goal: Will not experience complications related to urinary retention Outcome: Progressing   Problem: Pain Managment: Goal: General experience of comfort will improve Outcome: Progressing   Problem: Safety: Goal: Ability to remain free from injury will improve Outcome: Progressing   Problem: Skin Integrity: Goal: Risk for impaired skin integrity will decrease Outcome: Progressing

## 2018-12-24 NOTE — Progress Notes (Signed)
Nutrition Follow-up  RD working remotely.  DOCUMENTATION CODES:   Not applicable  INTERVENTION:    Continue Ensure Enlive po TID, each supplement provides 350 kcal and 20 grams of protein.  Continue Hormel Shake daily with Breakfast which provides 520 kcals and 22 g of protein and Magic cup BID with lunch and dinner, each supplement provides 290 kcal and 9 grams of protein, automatically on meal trays to optimize nutritional intake.   Encourage intake of meals and supplements.  NUTRITION DIAGNOSIS:   Increased nutrient needs related to acute illness(COVID 19 PNA) as evidenced by estimated needs.  Ongoing  GOAL:   Patient will meet greater than or equal to 90% of their needs  Progressing  MONITOR:   PO intake, Supplement acceptance, Labs  ASSESSMENT:   62 yo female admitted with fever, chills, COVID 19 PNA (tested positive on 11/6). PMH includes sarcoidosis, cervical cancer, hypothyroidism, HLD.   Patient is on a regular diet consuming 25-45% of meals over the past 4 days. She is also drinking Ensure Enlive supplements 2-3 times per day.  Labs reviewed.  CBG's: 147-130-114-120  Medications reviewed and include vitamin D3, Novolog, colace, solu-medrol, miralax, vitamin C, zinc sulfate.  Weight is trending down. Patient with increased nutrient needs and decreased PO intake.   Diet Order:   Diet Order            Diet regular Room service appropriate? Yes; Fluid consistency: Thin  Diet effective now              EDUCATION NEEDS:   Not appropriate for education at this time  Skin:  Skin Assessment: Reviewed RN Assessment  Last BM:  11/13 per RN documentation  Height:   Ht Readings from Last 1 Encounters:  12/22/2018 5\' 5"  (1.651 m)    Weight:   Wt Readings from Last 1 Encounters:  12/18/18 60.6 kg    Ideal Body Weight:  56.8 kg  BMI:  Body mass index is 22.23 kg/m.  Estimated Nutritional Needs:   Kcal:  1800-2000  Protein:  85-100  gm  Fluid:  >/= 1.8 L    Molli Barrows, RD, LDN, [place] Pager [phone] After Hours Pager [phone]

## 2018-12-25 LAB — COMPREHENSIVE METABOLIC PANEL
ALT: 97 U/L — ABNORMAL HIGH (ref 0–44)
AST: 36 U/L (ref 15–41)
Albumin: 2.4 g/dL — ABNORMAL LOW (ref 3.5–5.0)
Alkaline Phosphatase: 47 U/L (ref 38–126)
Anion gap: 10 (ref 5–15)
BUN: 33 mg/dL — ABNORMAL HIGH (ref 8–23)
CO2: 29 mmol/L (ref 22–32)
Calcium: 8.1 mg/dL — ABNORMAL LOW (ref 8.9–10.3)
Chloride: 97 mmol/L — ABNORMAL LOW (ref 98–111)
Creatinine, Ser: 0.8 mg/dL (ref 0.44–1.00)
GFR calc Af Amer: >60 mL/min (ref >60)
GFR calc non Af Amer: >60 mL/min (ref >60)
Glucose, Bld: 197 mg/dL — ABNORMAL HIGH (ref 70–99)
Potassium: 4.6 mmol/L (ref 3.5–5.1)
Sodium: 136 mmol/L (ref 135–145)
Total Bilirubin: 0.7 mg/dL (ref 0.3–1.2)
Total Protein: 4.4 g/dL — ABNORMAL LOW (ref 6.5–8.1)

## 2018-12-25 LAB — GLUCOSE, CAPILLARY
Glucose-Capillary: 110 mg/dL — ABNORMAL HIGH (ref 70–99)
Glucose-Capillary: 168 mg/dL — ABNORMAL HIGH (ref 70–99)
Glucose-Capillary: 148 mg/dL — ABNORMAL HIGH (ref 70–99)
Glucose-Capillary: 228 mg/dL — ABNORMAL HIGH (ref 70–99)

## 2018-12-25 MED ORDER — BISACODYL 5 MG PO TBEC
10.0000 mg | DELAYED_RELEASE_TABLET | Freq: Every day | ORAL | Status: DC | PRN
  Administered 2018-12-25: 10 mg via ORAL
  Filled 2018-12-25: qty 2

## 2018-12-25 MED ORDER — PREDNISOLONE 5 MG PO TABS
20.0000 mg | ORAL_TABLET | Freq: Every day | ORAL | Status: DC
  Administered 2018-12-26 – 2018-12-28 (×3): 20 mg via ORAL
  Filled 2018-12-25 (×3): qty 4

## 2018-12-25 NOTE — Progress Notes (Signed)
Physical Therapy Treatment Patient Details Name: Kim Harrison MRN: [id number] DOB: 1955-09-20 Today's Date: 12/25/2018    History of Present Illness 63 y/o female pt w/ hx of sarcoidosis, cervical cancer, presented to hospital with c/o SOB, hypothyroidism, HLD, fever and chills on 11/01 was treated and dx home with family, 11/06 sx returned and she returned to hospital when she was dx with COVID. Pt admitted to [facility] 11/10 with ARF w/ hypoxia, PNA sec to COVID 19, hypothyroidism and sarcoidosis.    PT Comments     Patient reports continued need for BM.  Patient encouraged to mobilize to recliner.  Pt making slow progress towards PT goals, continues to have limitations due to respiratory status and soft BP/dizziness with being upright. Pt able to progress OOB to chair during this session, requiring minA+2 for functional transfers using RW. Trialled pt on 4L HFNC, SpO2 decreasing to 71% with transition to EOB, bumped to 8L and then 10L with sats maintaining in mid 70s however pt in no significant distress. Post transfer to recliner increased to 12L with SpO2 increasing to 87% (within approx 2-3 min), RR 22 and HR 75. (use of 15L HFNC end of session while pt eating lunch). Included use of flutter valve with seated rest to further promote slow, deep breathing and improvements noted. BP start of session: 98/63, sitting EOB: 95/49, post transfer to recliner 89/65. Did test SPO2 on forehead with [employer] and found SPO2 in 70's on 8 then 10 L. RN in to adjust O2 to 12 then 15 L. Finally SPO2 >88%. Pattient does require increased time to recover O2 saturation. Is noted to be less visible SOB  Per patient/ case management- Patient  Will return home. Currently needs more mobilization  And working through SOB and monitoring of SPO2 closely.  Follow Up Recommendations  Home health PT     Equipment Recommendations  Wheelchair (measurements PT);Wheelchair cushion (measurements PT);Rolling walker with 5"  wheels    Recommendations for Other Services       Precautions / Restrictions Precautions Precautions: Fall Precaution Comments: O2 desats w/ activity, hx of vasovagal, increased O2 needs Restrictions Weight Bearing Restrictions: No    Mobility  Bed Mobility Overal bed mobility: Needs Assistance Bed Mobility: Supine to Sit     Supine to sit: Min assist     General bed mobility comments: increased time/effort, HOB elevated and use of bedrail, minA for trunk elevation, + dizziness  Transfers Overall transfer level: Needs assistance Equipment used: Rolling walker (2 wheeled) Transfers: Sit to/from [facility] Sit to Stand: Min assist;+2 safety/equipment;+2 physical assistance Stand pivot transfers: Min assist;+2 physical assistance;+2 safety/equipment       General transfer comment: steadying assist throughout, cues for safetym sat on bed edge x 5 minutes before transfer  Ambulation/Gait                 Stairs             Wheelchair Mobility    Modified Rankin (Stroke Patients Only)       Balance Overall balance assessment: Needs assistance Sitting-balance support: Feet supported Sitting balance-Leahy Scale: Fair     Standing balance support: Bilateral upper extremity supported Standing balance-Leahy Scale: Poor Standing balance comment: realiant on UE support/external assist                            Cognition Arousal/Alertness: Awake/alert Behavior During Therapy: WFL for tasks assessed/performed;Anxious Overall Cognitive Status:  Within Functional Limits for tasks assessed                                 General Comments: appears less anxious today than previous reported      Exercises General Exercises - Lower Extremity Ankle Circles/Pumps: AROM;Both;Other (comment);Seated(performing multiple reps throughout session) Other Exercises Other Exercises: use of flutter valve while seated EOB and in  recliner to help control breathing    General Comments        Pertinent Vitals/Pain Pain Assessment: Faces Faces Pain Scale: Hurts even more Pain Location: discomfort in abdomen intermittently, cramping Pain Descriptors / Indicators: Cramping;Discomfort;Grimacing Pain Intervention(s): Monitored during session    Home Living                      Prior Function            PT Goals (current goals can now be found in the care plan section) Acute Rehab PT Goals Patient Stated Goal: motivated to keep getting better PT Goal Formulation: With patient Time For Goal Achievement: 01/08/19 Potential to Achieve Goals: Fair Progress towards PT goals: Progressing toward goals    Frequency    Min 3X/week      PT Plan Current plan remains appropriate    Co-evaluation PT/OT/SLP Co-Evaluation/Treatment: Yes Reason for Co-Treatment: Complexity of the patient's impairments (multi-system involvement);For patient/therapist safety PT goals addressed during session: Mobility/safety with mobility OT goals addressed during session: ADL's and self-care      AM-PAC PT "6 Clicks" Mobility   Outcome Measure  Help needed turning from your back to your side while in a flat bed without using bedrails?: A Little Help needed moving from lying on your back to sitting on the side of a flat bed without using bedrails?: A Little Help needed moving to and from a bed to a chair (including a wheelchair)?: A Lot Help needed standing up from a chair using your arms (e.g., wheelchair or bedside chair)?: A Lot Help needed to walk in hospital room?: Total Help needed climbing 3-5 steps with a railing? : Total 6 Click Score: 12    End of Session Equipment Utilized During Treatment: Oxygen Activity Tolerance: Treatment limited secondary to medical complications (Comment);Other (comment) Patient left: in chair;with call bell/phone within reach;with nursing/sitter in room Nurse Communication:  Mobility status PT Visit Diagnosis: Other abnormalities of gait and mobility (R26.89);Muscle weakness (generalized) (M62.81)     Time: [id number] PT Time Calculation (min) (ACUTE ONLY): 44 min  Charges:  $Therapeutic Activity: 8-22 mins                     Tresa Endo PT Acute Rehabilitation Services  Office [phone]    Rua, Lucus 12/25/2018, 3:01 PM

## 2018-12-25 NOTE — Progress Notes (Addendum)
PROGRESS NOTE    Kim Harrison  [id number] DOB: Jun 20, 1955 DOA: 12/23/2018 PCP: Lou Miner, MD    Brief Narrative:  63 year old female who presented with dyspnea. She does have significant past medical history of sarcoidosis, hypothyroidism, and dyslipidemia. She reported chills for about 10 days, then she tested negative for COVID-19 (11/29/18), she received antibiotics and steroidswith mild improvement of her symptoms. 6 days later she had recurrent symptoms, she was seen at [facility] tested positive for COVID-19. She was treated as an outpatient with prednisone with no improvement of her symptoms,now with worsening dyspnea associated with generalized weakness, fevers and chills.. 3 days later onNovember 9,she was admitted to the hospital. On her initial physical examination blood pressure 116/73, pulse rate 79, respiratory rate 20, oxygen saturation 93%. Her lungs had no rhonchi or rales, heart S1-S2 present, abdomen soft, no lower extremity edema. Her chest radiograph had interstitial infiltrates, lower zone of right lobe and left lower lobe.EKG 73 bpm, left axis deviation, left anterior fascicular block, right bundle branch block, sinus rhythm, no significant ST segment or T wave changes.  Patient was admitted to the hospitalwith aworking diagnosis of acute hypoxic respiratory failure due to SARS COVID-19 viral pneumonia.  Patient had worsening hypoxic respiratory failure despite treatment with systemic corticosteroids, remdesivir, Actemrax2,and convalescent plasma.  She continued to have high oxygen requirements, 15 L per high flow nasal cannula along with nonrebreather mask. Further work-up with CT chest showed worsening groundglass opacities with septal thickening. Negative for pulmonary embolism.  Her hospitalization has been complicated by severe anxiety and panic attacks.  Slowly progressing, today down to 6 LPM per non rebreather  mask, with good toleration   Assessment & Plan:   Principal Problem:   Acute respiratory failure with hypoxia (HCC) Active Problems:   Pneumonia due to COVID-19 virus   Hypothyroidism   Sarcoidosis   1.Acute hypoxic respiratory failure due to SARS COVID-19 viral pneumonia. Patient has completed medical therapy with for viral pneumonia, imaging personally reviewed noted significant lung injury dueSARS COVD 19.  RR: 18  Pulse oxymetry: 99 Fi02: 15LPM per HFNC  Patient continue with high oxygen requirements. Continue with antitussive agents, bronchodilators, and airway clearing techniques with flutter valve - incentive spirometer.  Physical therapy and out of bed as tolerated.   Taper steroids to prednisone 20 mg daily.   Patient continue at high risk for worsening respiratory failure.   2. Severe anxiety and panic attacks. On lexapro, clonazepam and as needed alprazolam. No confusion or agitation. No insomnia.   3. Dyslipidemia. continue with statin therapy.   4. Hypothyroid. Continue with levothyroxine   5. Hypokalemia and hypochloremia/ metabolic alkalosis. electrolytes continue to be stable with K at 4,6 and Cl at 97 with serum bicarbonate at 29, renal function with serum cr at 0,80. Continue to hold IV fluids and continue to encourage po intake.   6. Sarcoidosis.  No clinical signs of exacerbation.  7. Constipation. Continue bowel regime with enemas, bisacodyl and miralax. Out of bed as tolerated.   DVT prophylaxis:enoxaparin Code Status:full Family Communication:I spoke over the phone with the patient'shusbandabout patient's condition, plan of care and all questions were addressed. Disposition Plan/ discharge barriers: pending clinical improvement.  Body mass index is 22.23 kg/m. Malnutrition Type:  Nutrition Problem: Increased nutrient needs Etiology: acute illness(COVID 19 PNA)   Malnutrition Characteristics:  Signs/Symptoms: estimated  needs   Nutrition Interventions:  Interventions: Ensure Enlive (each supplement provides 350kcal and 20 grams of protein), Hormel Shake, Magic cup  RN Pressure Injury Documentation:     Consultants:     Procedures:     Antimicrobials:       Subjective: Patient continue to have constipation, dyspnea is slowly improving, continue to be very weak and deconditioned, no nausea or vomiting.   Objective: Vitals:   12/24/18 1900 12/24/18 2000 12/24/18 2031 12/25/18 0500  BP:   (!) 85/63 93/61  Pulse: 72 86 72 73  Resp: 18 (!) 25 18 20   Temp:   98.4 F (36.9 C) 98.6 F (37 C)  TempSrc:   Oral Oral  SpO2: 99% 95% 94% 93%  Weight:      Height:       No intake or output data in the 24 hours ending 12/25/18 0843 Filed Weights   12/15/18 0600 12/16/18 0500 12/18/18 0700  Weight: 62.9 kg 63 kg 60.6 kg    Examination:   General: deconditioned and ill looking appearing.  Neurology: Awake and alert, non focal  E ENT: mild pallor, no icterus, oral mucosa moist Cardiovascular: No JVD. S1-S2 present, rhythmic, no gallops, rubs, or murmurs. No lower extremity edema. Pulmonary: vesicular breath sounds bilaterally. Gastrointestinal. Abdomen with no organomegaly, non tender, no rebound or guarding Skin. No rashes Musculoskeletal: no joint deformities     Data Reviewed: I have personally reviewed following labs and imaging studies  CBC: Recent Labs  Lab 12/19/18 0515 12/20/18 1635 12/21/18 0345 12/22/18 0246 12/23/18 0113  WBC 20.8* 21.4* 21.5* 18.1* 18.6*  HGB 16.3* 15.9* 16.0* 15.6* 14.0  HCT 47.7* 46.6* 46.9* 45.6 41.7  MCV 90.0 88.9 88.8 89.4 91.0  PLT 280 234 231 197 0000000   Basic Metabolic Panel: Recent Labs  Lab 12/19/18 0515  12/21/18 0345 12/22/18 0246 12/23/18 0113 12/24/18 0236 12/25/18 0135  NA 135   < > 135 133* 134* 135 136  K 4.0   < > 3.4* 3.3* 4.1 4.2 4.6  CL 88*   < > 83* 83* 92* 96* 97*  CO2 33*   < > 36* 35* 31 30 29   GLUCOSE 222*   <  > 149* 177* 192* 150* 197*  BUN 46*   < > 44* 40* 40* 34* 33*  CREATININE 1.17*   < > 1.10* 0.99 0.78 0.78 0.80  CALCIUM 8.7*   < > 8.2* 8.5* 7.7* 7.6* 8.1*  MG 2.8*  --   --   --  2.6*  --   --    < > = values in this interval not displayed.   GFR: Estimated Creatinine Clearance: 63.9 mL/min (by C-G formula based on SCr of 0.8 mg/dL). Liver Function Tests: Recent Labs  Lab 12/21/18 0345 12/22/18 0246 12/23/18 0113 12/24/18 0236 12/25/18 0135  AST 33 32 42* 34 36  ALT 141* 109* 103* 94* 97*  ALKPHOS 68 62 60 46 47  BILITOT 1.5* 1.1 1.4* 0.7 0.7  PROT 5.9* 5.3* 4.7* 4.4* 4.4*  ALBUMIN 3.1* 2.6* 2.4* 2.2* 2.4*   No results for input(s): LIPASE, AMYLASE in the last 168 hours. No results for input(s): AMMONIA in the last 168 hours. Coagulation Profile: No results for input(s): INR, PROTIME in the last 168 hours. Cardiac Enzymes: No results for input(s): CKTOTAL, CKMB, CKMBINDEX, TROPONINI in the last 168 hours. BNP (last 3 results) No results for input(s): PROBNP in the last 8760 hours. HbA1C: No results for input(s): HGBA1C in the last 72 hours. CBG: Recent Labs  Lab 12/24/18 0807 12/24/18 1139 12/24/18 1633 12/24/18 1959 12/25/18 0822  GLUCAP 120*  198* 138* 121* 110*   Lipid Profile: No results for input(s): CHOL, HDL, LDLCALC, TRIG, CHOLHDL, LDLDIRECT in the last 72 hours. Thyroid Function Tests: No results for input(s): TSH, T4TOTAL, FREET4, T3FREE, THYROIDAB in the last 72 hours. Anemia Panel: No results for input(s): VITAMINB12, FOLATE, FERRITIN, TIBC, IRON, RETICCTPCT in the last 72 hours.    Radiology Studies: I have reviewed all of the imaging during this hospital visit personally     Scheduled Meds: . Chlorhexidine Gluconate Cloth  6 each Topical Daily  . cholecalciferol  1,000 Units Oral Daily  . clonazePAM  1 mg Oral BID  . docusate sodium  100 mg Oral BID  . escitalopram  10 mg Oral Daily  . feeding supplement (ENSURE ENLIVE)  237 mL Oral TID  WC  . fondaparinux (ARIXTRA) injection  2.5 mg Subcutaneous Q0600  . insulin aspart  0-20 Units Subcutaneous TID WC  . insulin aspart  0-5 Units Subcutaneous QHS  . levothyroxine  75 mcg Oral Daily  . mouth rinse  15 mL Mouth Rinse BID  . methylPREDNISolone (SOLU-MEDROL) injection  15.2 mg Intravenous Q12H  . pantoprazole  40 mg Oral Daily  . polyethylene glycol  17 g Oral BID  . vitamin C  500 mg Oral Daily  . zinc sulfate  220 mg Oral Daily   Continuous Infusions:   LOS: 15 days        Kim Harrison Gerome Apley, MD

## 2018-12-25 NOTE — Progress Notes (Addendum)
A/O no noted distress. Did not administered klonopin resulted to sleeping, Probation officer normally administers the med around 11 or 1130 resulted in pt's normal sleeping hour when home. Pt was sleeping around 217, but states "I didn't sleep." Each time the writer made a round pt's eyes were closed  Slept without the NRB, making progress  Noted prolapse bladder.   Disimpacted able to remove some bowel. Encouraged pt to eat more fiber and drink plenty of fluids.

## 2018-12-25 NOTE — Progress Notes (Signed)
Occupational Therapy Treatment Patient Details Name: Kim Harrison MRN: [id number] DOB: September 10, 1955 Today's Date: 12/25/2018    History of present illness 63 y/o female pt w/ hx of sarcoidosis, cervical cancer, presented to hospital with c/o SOB, hypothyroidism, HLD, fever and chills on 11/01 was treated and dx home with family, 11/06 sx returned and she returned to hospital when she was dx with COVID. Pt admitted to [facility] 11/10 with ARF w/ hypoxia, PNA sec to COVID 19, hypothyroidism and sarcoidosis.   OT comments  Pt making slow progress towards OT goals, continues to have limitations due to respiratory status and soft BP/dizziness with being upright. Pt able to progress OOB to chair during this session, requiring minA+2 for functional transfers using RW. Trialled pt on 4L HFNC, SpO2 decreasing to 71% with transition to EOB, bumped to 8L and then 10L with sats maintaining in mid 70s however pt in no significant distress. Post transfer to recliner increased to 12L with SpO2 increasing to 87% (within approx 2-3 min), RR 22 and HR 75. (use of 15L HFNC end of session while pt eating lunch). Included use of flutter valve with seated rest to further promote slow, deep breathing and improvements noted. BP start of session: 98/63, sitting EOB: 95/49, post transfer to recliner 89/61. Pt will benefit from continued acute OT services and currently recommend post acute therapy services to progress pt towards her PLOF. Of note pt currently reports preference for returning home vs rehab, if pt to return home recommend [place] services and close hands on 24hr supervision/assist. Will follow.   Follow Up Recommendations  CIR;Supervision/Assistance - 24 hour(pending progress)    Equipment Recommendations  3 in 1 bedside commode          Precautions / Restrictions Precautions Precautions: Fall Precaution Comments: O2 desats w/ activity, hx of vasovagal, increased O2 needs Restrictions Weight Bearing  Restrictions: No       Mobility Bed Mobility Overal bed mobility: Needs Assistance Bed Mobility: Supine to Sit     Supine to sit: Min assist     General bed mobility comments: increased time/effort, HOB elevated and use of bedrail, minA for trunk elevation, + dizziness  Transfers Overall transfer level: Needs assistance Equipment used: Rolling walker (2 wheeled) Transfers: Sit to/from [facility] Sit to Stand: Min assist;+2 safety/equipment;+2 physical assistance Stand pivot transfers: Min assist;+2 physical assistance;+2 safety/equipment       General transfer comment: steadying assist throughout, cues for safety    Balance Overall balance assessment: Needs assistance Sitting-balance support: Feet supported Sitting balance-Leahy Scale: Fair     Standing balance support: Bilateral upper extremity supported Standing balance-Leahy Scale: Poor Standing balance comment: realiant on UE support/external assist                           ADL either performed or assessed with clinical judgement   ADL Overall ADL's : Needs assistance/impaired Eating/Feeding: Set up;Sitting Eating/Feeding Details (indicate cue type and reason): assist to cut up food, setup with lunch tray end of session                                 Functional mobility during ADLs: Minimal assistance;+2 for physical assistance;+2 for safety/equipment;Rolling walker General ADL Comments: continues to have limitations due to soft BP (dizziness with upright) and respiratory status, only able to tolerate very short bouts of activity  Vision       Perception     Praxis      Cognition Arousal/Alertness: Awake/alert Behavior During Therapy: WFL for tasks assessed/performed;Anxious Overall Cognitive Status: Within Functional Limits for tasks assessed                                          Exercises Exercises: Other exercises;General Lower  Extremity General Exercises - Lower Extremity Ankle Circles/Pumps: AROM;Both;Other (comment);Seated(performing multiple reps throughout session) Other Exercises Other Exercises: use of flutter valve while seated EOB and in recliner to help control breathing   Shoulder Instructions       General Comments      Pertinent Vitals/ Pain       Pain Assessment: Faces Faces Pain Scale: Hurts even more Pain Location: discomfort in abdomen intermittently, cramping Pain Descriptors / Indicators: Cramping;Discomfort;Grimacing Pain Intervention(s): Limited activity within patient's tolerance;Monitored during session;Repositioned  Home Living                                          Prior Functioning/Environment              Frequency  Min 2X/week(will benefit from 3x/wk as spouse with preference for home)        Progress Toward Goals  OT Goals(current goals can now be found in the care plan section)  Progress towards OT goals: Progressing toward goals  Acute Rehab OT Goals Patient Stated Goal: motivated to keep getting better OT Goal Formulation: With patient Time For Goal Achievement: 12/27/18 Potential to Achieve Goals: Good ADL Goals Pt Will Perform Grooming: with supervision;sitting Pt Will Perform Lower Body Bathing: with supervision;sitting/lateral leans;sit to/from stand Pt Will Perform Lower Body Dressing: with supervision;sitting/lateral leans;sit to/from stand Pt Will Transfer to Toilet: with supervision;ambulating;stand pivot transfer Pt Will Perform Toileting - Clothing Manipulation and hygiene: with supervision;sit to/from stand Additional ADL Goal #1: Pt will tolerate EOB/OOB ADL >7 min with O2 sats maintaining >90%.  Plan Discharge plan remains appropriate    Co-evaluation    PT/OT/SLP Co-Evaluation/Treatment: Yes Reason for Co-Treatment: For patient/therapist safety(pt with hx of syncopy) PT goals addressed during session: Mobility/safety  with mobility OT goals addressed during session: ADL's and self-care      AM-PAC OT "6 Clicks" Daily Activity     Outcome Measure   Help from another person eating meals?: A Little Help from another person taking care of personal grooming?: A Little Help from another person toileting, which includes using toliet, bedpan, or urinal?: A Lot Help from another person bathing (including washing, rinsing, drying)?: A Lot Help from another person to put on and taking off regular upper body clothing?: A Lot Help from another person to put on and taking off regular lower body clothing?: A Lot 6 Click Score: 14    End of Session Equipment Utilized During Treatment: Oxygen  OT Visit Diagnosis: Muscle weakness (generalized) (M62.81);Unsteadiness on feet (R26.81);Other (comment)(decreased activity tolerance)   Activity Tolerance Patient tolerated treatment well;Other (comment)(limited due to respiratory status)   Patient Left in chair;with call bell/phone within reach;with nursing/sitter in room   Nurse Communication Mobility status        Time: [id number] OT Time Calculation (min): 36 min  Charges: OT General Charges $OT Visit: 1 Visit OT Treatments $Self Care/Home Management :  8-22 mins   Lou Cal, OT Supplemental Rehabilitation Services Pager [phone] Office [phone]    Raymondo Band 12/25/2018, 1:59 PM

## 2018-12-25 NOTE — Progress Notes (Signed)
Give spouse updated on pt's status

## 2018-12-25 NOTE — Progress Notes (Signed)
Updated patient's husband, Shanon Brow. He had no new questions at this time. He is appreciative of the care provided and the communication received.

## 2018-12-26 LAB — GLUCOSE, CAPILLARY
Glucose-Capillary: 105 mg/dL — ABNORMAL HIGH (ref 70–99)
Glucose-Capillary: 158 mg/dL — ABNORMAL HIGH (ref 70–99)
Glucose-Capillary: 141 mg/dL — ABNORMAL HIGH (ref 70–99)
Glucose-Capillary: 173 mg/dL — ABNORMAL HIGH (ref 70–99)

## 2018-12-26 MED ORDER — SODIUM CHLORIDE 0.45 % IV BOLUS
500.0000 mL | Freq: Once | INTRAVENOUS | Status: AC
  Administered 2018-12-26: 500 mL via INTRAVENOUS

## 2018-12-26 MED ORDER — BISACODYL 10 MG RE SUPP
10.0000 mg | Freq: Once | RECTAL | Status: AC
  Administered 2018-12-26: 10 mg via RECTAL
  Filled 2018-12-26: qty 1

## 2018-12-26 MED ORDER — FLEET ENEMA 7-19 GM/118ML RE ENEM: 1.0000 | ENEMA | Freq: Once | RECTAL | Status: DC

## 2018-12-26 NOTE — Progress Notes (Signed)
Spoke with Shanon Brow and answered all questions at this time

## 2018-12-26 NOTE — TOC Progression Note (Signed)
Transition of Care [facility]) - Progression Note    Patient Details  Name: Kim Harrison MRN: [id number] Date of Birth: [birth year]  [facility] CM/SW Contact  Loletha Grayer Beverely Pace, RN Phone Number: 12/26/2018, 2:21 PM  Clinical Narrative:   Case manager continues to follow for disposition. Patient is on 10L HFNC. Will likely need SNF.    Expected Discharge Plan: [place] Barriers to Discharge: Continued Medical Work up  Expected Discharge Plan and Services Expected Discharge Plan: [place] In-house Referral: NA Discharge Planning Services: CM Consult Post Acute Care Choice: [place] arrangements for the past 2 months: Single Family Home                 DME Arranged: 3-N-1 DME Agency: [place] Date DME Agency Contacted: 12/23/18 Time DME Agency Contacted: [phone] Representative spoke with at DME Agency: Magda Paganini HH Arranged: PT, OT   Date [facility]: 12/23/18       Social Determinants of Health ([place]) Interventions    Readmission Risk Interventions No flowsheet data found.

## 2018-12-26 NOTE — Progress Notes (Signed)
Spoke with patients spouse, Shanon Brow, on the phone. Updated him on patient progress and answered all relevant questions he had at this time.

## 2018-12-26 NOTE — Progress Notes (Signed)
PROGRESS NOTE    Kim Harrison  [id number] DOB: 06/30/1955 DOA: 12/14/2018 PCP: Lou Miner, MD    Brief Narrative:  63 year old female who presented with dyspnea. She does have significant past medical history of sarcoidosis, hypothyroidism, and dyslipidemia. She reported chills for about 10 days, then she tested negative for COVID-19 (11/29/18), she received antibiotics and steroidswith mild improvement of her symptoms. 6 days later she had recurrent symptoms, she was seen at [facility] tested positive for COVID-19. She was treated as an outpatient with prednisone with no improvement of her symptoms,now with worsening dyspnea associated with generalized weakness, fevers and chills.. 3 days later onNovember 9,she was admitted to the hospital. On her initial physical examination blood pressure 116/73, pulse rate 79, respiratory rate 20, oxygen saturation 93%. Her lungs had no rhonchi or rales, heart S1-S2 present, abdomen soft, no lower extremity edema. Her chest radiograph had interstitial infiltrates, lower zone of right lobe and left lower lobe.EKG 73 bpm, left axis deviation, left anterior fascicular block, right bundle branch block, sinus rhythm, no significant ST segment or T wave changes.  Patient was admitted to the hospitalwith aworking diagnosis of acute hypoxic respiratory failure due to SARS COVID-19 viral pneumonia.  Patient had worsening hypoxic respiratory failure despite treatment with systemic corticosteroids, remdesivir, Actemrax2,and convalescent plasma.  She continued to have high oxygen requirements, 15 L per high flow nasal cannula along with nonrebreather mask. Further work-up with CT chest showed worsening groundglass opacities with septal thickening. Negative for pulmonary embolism.  Her hospitalization has been complicated by severe anxiety and panic attacks.  Slowly progressing, but persistent high oxygen  requirements.    Assessment & Plan:   Principal Problem:   Acute respiratory failure with hypoxia (HCC) Active Problems:   Pneumonia due to COVID-19 virus   Hypothyroidism   Sarcoidosis    1.Acute hypoxic respiratory failure due to SARS COVID-19 viral pneumonia.Patient has completed medical therapy with for viral pneumonia( #5 remdesivir,Actemra x2 and convalescent plasma) imaging personally reviewed noted significant lung injury dueSARS COVD 19.  RR: 20  Pulse oxymetry: 92% Fi02: 10 LPM per HFNC  Onantitussive agents, bronchodilators, andairway clearing techniques with flutter valve-incentive spirometer.   Continue with slow taper steroids to prednisone 20 mg daily.   Patient continue at high risk for worsening respiratory failure.   2. Severe anxiety and panic attacks.Continue withlexapro, clonazepam and as needed alprazolam. Her symptoms have improved.    3. Dyslipidemia.On statin therapy.   4. Hypothyroid.On levothyroxine   5. Hypokalemia and hypochloremia/ metabolic alkalosis.Patient is tolerating po well, will follow renal functio in am.   6. Sarcoidosis.Nocurrent clinical signs of exacerbation.  7. Constipation. It has been persistent, will continue bowel regime with bisacodyl suppository, fleet enema and miralax. Continue to encourage  out of bed as tolerated.   DVT prophylaxis:enoxaparin Code Status:full Family Communication:I spoke over the phone with the patient'shusbandabout patient's condition, plan of care and all questions were addressed Disposition Plan/ discharge barriers: pending clinical improvement.   Body mass index is 22.23 kg/m. Malnutrition Type:  Nutrition Problem: Increased nutrient needs Etiology: acute illness(COVID 19 PNA)   Malnutrition Characteristics:  Signs/Symptoms: estimated needs   Nutrition Interventions:  Interventions: Ensure Enlive (each supplement provides 350kcal and 20 grams of  protein), Hormel Shake, Magic cup  RN Pressure Injury Documentation:     Consultants:     Procedures:     Antimicrobials:       Subjective: This am patient is very weak and deconditioned, continue to have no  significant bowel movement, no chest pain, no nausea or vomiting. Continue to have dyspnea, worse with movement.   Objective: Vitals:   12/26/18 0013 12/26/18 0400 12/26/18 0538 12/26/18 0700  BP:  (!) 90/54  (!) 94/58  Pulse:  80  71  Resp:  20  20  Temp: 98.3 F (36.8 C) 98.1 F (36.7 C)  97.7 F (36.5 C)  TempSrc: Oral Oral  Oral  SpO2:  93% 95% 92%  Weight:      Height:       No intake or output data in the 24 hours ending 12/26/18 0805 Filed Weights   12/15/18 0600 12/16/18 0500 12/18/18 0700  Weight: 62.9 kg 63 kg 60.6 kg    Examination:   General: deconditioned.  Neurology: Awake and alert, non focal  E ENT: mild pallor, no icterus, oral mucosa moist Cardiovascular: No JVD. S1-S2 present, rhythmic, no gallops, rubs, or murmurs. No lower extremity edema. Pulmonary: positive breath sounds bilaterally. Gastrointestinal. Abdomen with no organomegaly, non tender, no rebound or guarding Skin. No rashes Musculoskeletal: no joint deformities     Data Reviewed: I have personally reviewed following labs and imaging studies  CBC: Recent Labs  Lab 12/20/18 1635 12/21/18 0345 12/22/18 0246 12/23/18 0113  WBC 21.4* 21.5* 18.1* 18.6*  HGB 15.9* 16.0* 15.6* 14.0  HCT 46.6* 46.9* 45.6 41.7  MCV 88.9 88.8 89.4 91.0  PLT 234 231 197 0000000   Basic Metabolic Panel: Recent Labs  Lab 12/21/18 0345 12/22/18 0246 12/23/18 0113 12/24/18 0236 12/25/18 0135  NA 135 133* 134* 135 136  K 3.4* 3.3* 4.1 4.2 4.6  CL 83* 83* 92* 96* 97*  CO2 36* 35* 31 30 29   GLUCOSE 149* 177* 192* 150* 197*  BUN 44* 40* 40* 34* 33*  CREATININE 1.10* 0.99 0.78 0.78 0.80  CALCIUM 8.2* 8.5* 7.7* 7.6* 8.1*  MG  --   --  2.6*  --   --    GFR: Estimated Creatinine  Clearance: 63.9 mL/min (by C-G formula based on SCr of 0.8 mg/dL). Liver Function Tests: Recent Labs  Lab 12/21/18 0345 12/22/18 0246 12/23/18 0113 12/24/18 0236 12/25/18 0135  AST 33 32 42* 34 36  ALT 141* 109* 103* 94* 97*  ALKPHOS 68 62 60 46 47  BILITOT 1.5* 1.1 1.4* 0.7 0.7  PROT 5.9* 5.3* 4.7* 4.4* 4.4*  ALBUMIN 3.1* 2.6* 2.4* 2.2* 2.4*   No results for input(s): LIPASE, AMYLASE in the last 168 hours. No results for input(s): AMMONIA in the last 168 hours. Coagulation Profile: No results for input(s): INR, PROTIME in the last 168 hours. Cardiac Enzymes: No results for input(s): CKTOTAL, CKMB, CKMBINDEX, TROPONINI in the last 168 hours. BNP (last 3 results) No results for input(s): PROBNP in the last 8760 hours. HbA1C: No results for input(s): HGBA1C in the last 72 hours. CBG: Recent Labs  Lab 12/24/18 1959 12/25/18 0822 12/25/18 1114 12/25/18 1609 12/25/18 2004  GLUCAP 121* 110* 168* 148* 228*   Lipid Profile: No results for input(s): CHOL, HDL, LDLCALC, TRIG, CHOLHDL, LDLDIRECT in the last 72 hours. Thyroid Function Tests: No results for input(s): TSH, T4TOTAL, FREET4, T3FREE, THYROIDAB in the last 72 hours. Anemia Panel: No results for input(s): VITAMINB12, FOLATE, FERRITIN, TIBC, IRON, RETICCTPCT in the last 72 hours.    Radiology Studies: I have reviewed all of the imaging during this hospital visit personally     Scheduled Meds: . Chlorhexidine Gluconate Cloth  6 each Topical Daily  . cholecalciferol  1,000 Units  Oral Daily  . clonazePAM  1 mg Oral BID  . docusate sodium  100 mg Oral BID  . escitalopram  10 mg Oral Daily  . feeding supplement (ENSURE ENLIVE)  237 mL Oral TID WC  . fondaparinux (ARIXTRA) injection  2.5 mg Subcutaneous Q0600  . insulin aspart  0-20 Units Subcutaneous TID WC  . insulin aspart  0-5 Units Subcutaneous QHS  . levothyroxine  75 mcg Oral Daily  . mouth rinse  15 mL Mouth Rinse BID  . pantoprazole  40 mg Oral Daily  .  polyethylene glycol  17 g Oral BID  . prednisoLONE  20 mg Oral Daily  . vitamin C  500 mg Oral Daily  . zinc sulfate  220 mg Oral Daily   Continuous Infusions:   LOS: 16 days        Mauricio Gerome Apley, MD

## 2018-12-27 LAB — GLUCOSE, CAPILLARY
Glucose-Capillary: 85 mg/dL (ref 70–99)
Glucose-Capillary: 94 mg/dL (ref 70–99)
Glucose-Capillary: 209 mg/dL — ABNORMAL HIGH (ref 70–99)
Glucose-Capillary: 167 mg/dL — ABNORMAL HIGH (ref 70–99)

## 2018-12-27 LAB — BASIC METABOLIC PANEL
Anion gap: 9 (ref 5–15)
BUN: 24 mg/dL — ABNORMAL HIGH (ref 8–23)
CO2: 27 mmol/L (ref 22–32)
Calcium: 7.9 mg/dL — ABNORMAL LOW (ref 8.9–10.3)
Chloride: 99 mmol/L (ref 98–111)
Creatinine, Ser: 0.82 mg/dL (ref 0.44–1.00)
GFR calc Af Amer: >60 mL/min (ref >60)
GFR calc non Af Amer: >60 mL/min (ref >60)
Glucose, Bld: 122 mg/dL — ABNORMAL HIGH (ref 70–99)
Potassium: 3.7 mmol/L (ref 3.5–5.1)
Sodium: 135 mmol/L (ref 135–145)

## 2018-12-27 MED ORDER — SODIUM CHLORIDE 0.9 % IV BOLUS
500.0000 mL | Freq: Once | INTRAVENOUS | Status: AC
  Administered 2018-12-27: 500 mL via INTRAVENOUS

## 2018-12-27 NOTE — Progress Notes (Signed)
Spoke with Shanon Brow and answered all questions

## 2018-12-27 NOTE — Progress Notes (Signed)
Patient on the phone with family when entering room. Asked if they had any  questions or concerns I could answer at this time; they declined updates stating that patient and physician had updated already today.

## 2018-12-27 NOTE — Progress Notes (Signed)
MEWS score yellow due to lower blood pressures and slightly elevated respirations with activity. Physician aware of patient status, and not notified at this time. Will continue to monitor patient progress and report accordingly.

## 2018-12-27 NOTE — Progress Notes (Signed)
PROGRESS NOTE    Keeva Schnaidt  [id number] DOB: 07/21/1955 DOA: 12/23/2018 PCP: Lou Miner, MD    Brief Narrative:  63 year old female who presented with dyspnea. She does have significant past medical history of sarcoidosis, hypothyroidism, and dyslipidemia. She reported chills for about 10 days, then she tested negative for COVID-19 (11/29/18), she received antibiotics and steroidswith mild improvement of her symptoms. 6 days later she had recurrent symptoms, she was seen at [facility] tested positive for COVID-19. She was treated as an outpatient with prednisone with no improvement of her symptoms,now with worsening dyspnea associated with generalized weakness, fevers and chills.. 3 days later onNovember 9,she was admitted to the hospital. On her initial physical examination blood pressure 116/73, pulse rate 79, respiratory rate 20, oxygen saturation 93%. Her lungs had no rhonchi or rales, heart S1-S2 present, abdomen soft, no lower extremity edema. Her chest radiograph had interstitial infiltrates, lower zone of right lobe and left lower lobe.EKG 73 bpm, left axis deviation, left anterior fascicular block, right bundle branch block, sinus rhythm, no significant ST segment or T wave changes.  Patient was admitted to the hospitalwith aworking diagnosis of acute hypoxic respiratory failure due to SARS COVID-19 viral pneumonia.  Patient had worsening hypoxic respiratory failure despite treatment with systemic corticosteroids, remdesivir, Actemrax2,and convalescent plasma.  She continued to have high oxygen requirements, 15 L per high flow nasal cannula along with nonrebreather mask. Further work-up with CT chest showed worsening groundglass opacities with septal thickening. Negative for pulmonary embolism.  Her hospitalization has been complicated by severe anxiety and panic attacks.  Slowly progressing, but persistent high oxygen  requirements.    Assessment & Plan:   Principal Problem:   Acute respiratory failure with hypoxia (HCC) Active Problems:   Pneumonia due to COVID-19 virus   Hypothyroidism   Sarcoidosis    1.Acute hypoxic respiratory failure due to SARS COVID-19 viral pneumonia.Patient has completed medical therapy  for viral pneumonia with 5#5 remdesivir,Actemra x2 and convalescent plasma and chest imaging (personally reviewed) noted significant lung injury dueSARS COVD 19.  RR: 20  Pulse oxymetry: 92% Fi02: 10 LPM per HFNC  Continue withantitussive agents, bronchodilators, andairway clearing techniques with flutter valve-incentive spirometer.Patient continue to be very weak and deconditioned, continue with physical therapy. Supplemental oxygen to target 02 saturation 88% or above.   Slow prednisone taper, currently on 20 mg daily.   2. Severe anxiety and panic attacks.No further attacks, patient has been calm and sleeping well, continue withlexapro, clonazepam and as needed alprazolam.   3. Dyslipidemia.Continue withstatin therapy.   4. Hypothyroid.Continue with levothyroxine   5. Hypokalemia and hypochloremia/ metabolic alkalosis.Renal function continue to be stable, K at 3,7 and serum bicarbonate at 27.  6. Sarcoidosis.Nosigns of exacerbation.  7. Constipation. Positive bowel movement yesterday, will continue current bowel regimen with miralax bid. .  DVT prophylaxis:enoxaparin Code Status:full Family Communication:I spoke over the phone at the bedside with the patient'shusbandabout patient's condition, plan of care and all questions were addressed Disposition Plan/ discharge barriers:pending clinical improvement.   Body mass index is 22.23 kg/m. Malnutrition Type:  Nutrition Problem: Increased nutrient needs Etiology: acute illness(COVID 19 PNA)   Malnutrition Characteristics:  Signs/Symptoms: estimated needs   Nutrition  Interventions:  Interventions: Ensure Enlive (each supplement provides 350kcal and 20 grams of protein), Hormel Shake, Magic cup  RN Pressure Injury Documentation:     Consultants:     Procedures:     Antimicrobials:       Subjective: Patient continue to feel very  weak and deconditioned, no nausea or vomiting and tolerating po well. Positive bowel movement yesterday.   Objective: Vitals:   12/26/18 2314 12/26/18 2356 12/27/18 0524 12/27/18 0747  BP: (!) 94/57 94/70 (!) 94/58 (!) 85/52  Pulse: 64 63 74 72  Resp: 16 15 (!) 21 20  Temp:  97.9 F (36.6 C) 97.9 F (36.6 C) 98.4 F (36.9 C)  TempSrc:  Oral Oral Oral  SpO2: 96% 95% 91% (!) 87%  Weight:      Height:        Intake/Output Summary (Last 24 hours) at 12/27/2018 0818 Last data filed at 12/26/2018 2048 Gross per 24 hour  Intake 600 ml  Output -  Net 600 ml   Filed Weights   12/15/18 0600 12/16/18 0500 12/18/18 0700  Weight: 62.9 kg 63 kg 60.6 kg    Examination:   General: Not in pain, mild dyspnea, deconditioned  Neurology: Awake and alert, non focal  E ENT: mild pallor, no icterus, oral mucosa moist Cardiovascular: No JVD. S1-S2 present, rhythmic, no gallops, rubs, or murmurs. No lower extremity edema. Pulmonary: positive breath sounds bilaterally. Gastrointestinal. Abdomen with, no organomegaly, non tender, no rebound or guarding Skin. No rashes Musculoskeletal: no joint deformities     Data Reviewed: I have personally reviewed following labs and imaging studies  CBC: Recent Labs  Lab 12/20/18 1635 12/21/18 0345 12/22/18 0246 12/23/18 0113  WBC 21.4* 21.5* 18.1* 18.6*  HGB 15.9* 16.0* 15.6* 14.0  HCT 46.6* 46.9* 45.6 41.7  MCV 88.9 88.8 89.4 91.0  PLT 234 231 197 0000000   Basic Metabolic Panel: Recent Labs  Lab 12/22/18 0246 12/23/18 0113 12/24/18 0236 12/25/18 0135 12/27/18 0220  NA 133* 134* 135 136 135  K 3.3* 4.1 4.2 4.6 3.7  CL 83* 92* 96* 97* 99  CO2 35* 31 30 29 27    GLUCOSE 177* 192* 150* 197* 122*  BUN 40* 40* 34* 33* 24*  CREATININE 0.99 0.78 0.78 0.80 0.82  CALCIUM 8.5* 7.7* 7.6* 8.1* 7.9*  MG  --  2.6*  --   --   --    GFR: Estimated Creatinine Clearance: 62.4 mL/min (by C-G formula based on SCr of 0.82 mg/dL). Liver Function Tests: Recent Labs  Lab 12/21/18 0345 12/22/18 0246 12/23/18 0113 12/24/18 0236 12/25/18 0135  AST 33 32 42* 34 36  ALT 141* 109* 103* 94* 97*  ALKPHOS 68 62 60 46 47  BILITOT 1.5* 1.1 1.4* 0.7 0.7  PROT 5.9* 5.3* 4.7* 4.4* 4.4*  ALBUMIN 3.1* 2.6* 2.4* 2.2* 2.4*   No results for input(s): LIPASE, AMYLASE in the last 168 hours. No results for input(s): AMMONIA in the last 168 hours. Coagulation Profile: No results for input(s): INR, PROTIME in the last 168 hours. Cardiac Enzymes: No results for input(s): CKTOTAL, CKMB, CKMBINDEX, TROPONINI in the last 168 hours. BNP (last 3 results) No results for input(s): PROBNP in the last 8760 hours. HbA1C: No results for input(s): HGBA1C in the last 72 hours. CBG: Recent Labs  Lab 12/25/18 2004 12/26/18 0745 12/26/18 1142 12/26/18 1642 12/26/18 2104  GLUCAP 228* 105* 158* 141* 173*   Lipid Profile: No results for input(s): CHOL, HDL, LDLCALC, TRIG, CHOLHDL, LDLDIRECT in the last 72 hours. Thyroid Function Tests: No results for input(s): TSH, T4TOTAL, FREET4, T3FREE, THYROIDAB in the last 72 hours. Anemia Panel: No results for input(s): VITAMINB12, FOLATE, FERRITIN, TIBC, IRON, RETICCTPCT in the last 72 hours.    Radiology Studies: I have reviewed all of the imaging  during this hospital visit personally     Scheduled Meds: . Chlorhexidine Gluconate Cloth  6 each Topical Daily  . cholecalciferol  1,000 Units Oral Daily  . clonazePAM  1 mg Oral BID  . docusate sodium  100 mg Oral BID  . escitalopram  10 mg Oral Daily  . feeding supplement (ENSURE ENLIVE)  237 mL Oral TID WC  . fondaparinux (ARIXTRA) injection  2.5 mg Subcutaneous Q0600  . insulin  aspart  0-20 Units Subcutaneous TID WC  . insulin aspart  0-5 Units Subcutaneous QHS  . levothyroxine  75 mcg Oral Daily  . mouth rinse  15 mL Mouth Rinse BID  . pantoprazole  40 mg Oral Daily  . polyethylene glycol  17 g Oral BID  . prednisoLONE  20 mg Oral Daily  . sodium phosphate  1 enema Rectal Once  . vitamin C  500 mg Oral Daily  . zinc sulfate  220 mg Oral Daily   Continuous Infusions:   LOS: 17 days        Disa Riedlinger Gerome Apley, MD

## 2018-12-28 ENCOUNTER — Inpatient Hospital Stay (HOSPITAL_COMMUNITY): Payer: BC Managed Care – PPO

## 2018-12-28 DIAGNOSIS — I361 Nonrheumatic tricuspid (valve) insufficiency: Secondary | ICD-10-CM | POA: Diagnosis not present

## 2018-12-28 DIAGNOSIS — I959 Hypotension, unspecified: Secondary | ICD-10-CM

## 2018-12-28 LAB — COMPREHENSIVE METABOLIC PANEL
ALT: 57 U/L — ABNORMAL HIGH (ref 0–44)
AST: 35 U/L (ref 15–41)
Albumin: 2.5 g/dL — ABNORMAL LOW (ref 3.5–5.0)
Alkaline Phosphatase: 63 U/L (ref 38–126)
Anion gap: 7 (ref 5–15)
BUN: 23 mg/dL (ref 8–23)
CO2: 27 mmol/L (ref 22–32)
Calcium: 7.8 mg/dL — ABNORMAL LOW (ref 8.9–10.3)
Chloride: 102 mmol/L (ref 98–111)
Creatinine, Ser: 0.79 mg/dL (ref 0.44–1.00)
GFR calc Af Amer: >60 mL/min (ref >60)
GFR calc non Af Amer: >60 mL/min (ref >60)
Glucose, Bld: 117 mg/dL — ABNORMAL HIGH (ref 70–99)
Potassium: 3.7 mmol/L (ref 3.5–5.1)
Sodium: 136 mmol/L (ref 135–145)
Total Bilirubin: 0.9 mg/dL (ref 0.3–1.2)
Total Protein: 4.8 g/dL — ABNORMAL LOW (ref 6.5–8.1)

## 2018-12-28 LAB — ECHOCARDIOGRAM COMPLETE
Height: 65 in
Weight: 2332.8 oz

## 2018-12-28 LAB — CBC WITH DIFFERENTIAL/PLATELET
Abs Immature Granulocytes: 0.06 10*3/uL (ref 0.00–0.07)
Basophils Absolute: 0 10*3/uL (ref 0.0–0.1)
Basophils Relative: 0 %
Eosinophils Absolute: 0.5 10*3/uL (ref 0.0–0.5)
Eosinophils Relative: 4 %
HCT: 42.3 % (ref 36.0–46.0)
Hemoglobin: 13.6 g/dL (ref 12.0–15.0)
Immature Granulocytes: 1 %
Lymphocytes Relative: 7 %
Lymphs Abs: 0.9 10*3/uL (ref 0.7–4.0)
MCH: 30.8 pg (ref 26.0–34.0)
MCHC: 32.2 g/dL (ref 30.0–36.0)
MCV: 95.9 fL (ref 80.0–100.0)
Monocytes Absolute: 0.3 10*3/uL (ref 0.1–1.0)
Monocytes Relative: 3 %
Neutro Abs: 10.2 10*3/uL — ABNORMAL HIGH (ref 1.7–7.7)
Neutrophils Relative %: 85 %
Platelets: 125 10*3/uL — ABNORMAL LOW (ref 150–400)
RBC: 4.41 MIL/uL (ref 3.87–5.11)
RDW: 13.8 % (ref 11.5–15.5)
WBC: 12 10*3/uL — ABNORMAL HIGH (ref 4.0–10.5)
nRBC: 0 % (ref 0.0–0.2)

## 2018-12-28 LAB — GLUCOSE, CAPILLARY
Glucose-Capillary: 94 mg/dL (ref 70–99)
Glucose-Capillary: 123 mg/dL — ABNORMAL HIGH (ref 70–99)
Glucose-Capillary: 208 mg/dL — ABNORMAL HIGH (ref 70–99)
Glucose-Capillary: 216 mg/dL — ABNORMAL HIGH (ref 70–99)

## 2018-12-28 LAB — D-DIMER, QUANTITATIVE: D-Dimer, Quant: 3.27 ug/mL-FEU — ABNORMAL HIGH (ref 0.00–0.50)

## 2018-12-28 LAB — FERRITIN: Ferritin: 121 ng/mL (ref 11–307)

## 2018-12-28 LAB — C-REACTIVE PROTEIN: CRP: <0.8 mg/dL (ref <1.0)

## 2018-12-28 MED ORDER — HYDROCORTISONE NA SUCCINATE PF 100 MG IJ SOLR
100.0000 mg | Freq: Three times a day (TID) | INTRAMUSCULAR | Status: DC
  Administered 2018-12-28 – 2018-12-29 (×4): 100 mg via INTRAVENOUS
  Filled 2018-12-28 (×4): qty 2

## 2018-12-28 MED ORDER — SODIUM CHLORIDE 0.9 % IV SOLN
1.0000 g | Freq: Three times a day (TID) | INTRAVENOUS | Status: DC
  Administered 2018-12-28 – 2018-12-31 (×10): 1 g via INTRAVENOUS
  Filled 2018-12-28 (×11): qty 1

## 2018-12-28 MED ORDER — POLYETHYLENE GLYCOL 3350 17 G PO PACK: 17.0000 g | PACK | Freq: Every day | ORAL | Status: DC | PRN

## 2018-12-28 MED ORDER — AZTREONAM 1 G IJ SOLR: 1.0000 g | Freq: Three times a day (TID) | INTRAMUSCULAR | Status: DC

## 2018-12-28 MED ORDER — SODIUM CHLORIDE 0.9 % IV BOLUS
500.0000 mL | Freq: Once | INTRAVENOUS | Status: AC
  Administered 2018-12-28: 500 mL via INTRAVENOUS

## 2018-12-28 NOTE — Progress Notes (Signed)
  Echocardiogram 2D Echocardiogram has been performed.  Darlina Sicilian M 12/28/2018, 12:58 PM

## 2018-12-28 NOTE — Progress Notes (Signed)
Had patient lay on right side and retook VS since she had an initial MEWS of 4 due to BP and respirations.  Her Mews went down to a score of 1.  Informed charge nurse and physician.

## 2018-12-28 NOTE — Progress Notes (Signed)
Pharmacy Antibiotic Note  Kim Harrison is a 63 y.o. female admitted on 12/15/2018 with a diagnosis of Covid-19 induced pneumonia.  Pharmacy has been consulted for Aztreonam dosing.  Patient with normal renal function, lean body weight (66.1kg).  Her last WBC was on 11/23 which was 18.6k.  She is currently afebrile and no noted micro data at present.  Plan: Aztreonam 1gm. IV every 8 hours with first dose now. Monitor s/s infection including any cultures and de-escalate as appropriate.  Height: 5\' 5"  (165.1 cm) Weight: 145 lb 12.8 oz (66.1 kg) IBW/kg (Calculated) : 57  Temp (24hrs), Avg:98.1 F (36.7 C), Min:97.6 F (36.4 C), Max:98.8 F (37.1 C)  Recent Labs  Lab 12/22/18 0246 12/23/18 0113 12/24/18 0236 12/25/18 0135 12/27/18 0220  WBC 18.1* 18.6*  --   --   --   CREATININE 0.99 0.78 0.78 0.80 0.82    Estimated Creatinine Clearance: 62.4 mL/min (by C-G formula based on SCr of 0.82 mg/dL).    Allergies  Allergen Reactions  . 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  . Cephalexin Anaphylaxis  . Penicillins Anaphylaxis    Did it involve swelling of the face/tongue/throat, SOB, or low BP? Yes Did it involve sudden or severe rash/hives, skin peeling, or any reaction on the inside of your mouth or nose? No Did you need to seek medical attention at a hospital or doctor's office? Yes When did it last happen?couple of years ago If all above answers are "NO", may proceed with cephalosporin use.   Marland Kitchen Beef-Derived Products   . Pork-Derived Products   . Sulfonamide Derivatives Hives    Antimicrobials this admission: Aztreonam 11/28>>  Dose adjustments this admission:   Microbiology results:  11/14 MRSA PCR: Neg.  Thank you for allowing pharmacy to be a part of this patient's care.  Rober Minion [facility] 12/28/2018 10:36 AM

## 2018-12-28 NOTE — Progress Notes (Signed)
Physical Therapy Treatment Patient Details Name: Kim Harrison MRN: [id number] DOB: January 04, 1956 Today's Date: 12/28/2018    History of Present Illness 63 y/o female pt w/ hx of sarcoidosis, cervical cancer, presented to hospital with c/o SOB, hypothyroidism, HLD, fever and chills on 11/01 was treated and dx home with family, 11/06 sx returned and she returned to hospital when she was dx with COVID. Pt admitted to [facility] 11/10 with ARF w/ hypoxia, PNA sec to COVID 19, hypothyroidism and sarcoidosis.    PT Comments    Pt states has not attempted sitting EOB in a few days, did very well with tx this am. Pt is needing increased assistance with functional mobility this am, needing min-mod a with bed mob, max a to stand at EOB, only able to stand briefly. Pt on 15L/min via HFNC and also additional supplemented NRB at 15L, with sit<>stand desats to low 80s but with cues able to recover to 90s within a minute. Noted increased edema in BLE and increased weakness sin BLE, will continue to encourage daily mobility and progress as pt tolerates.    Follow Up Recommendations  CIR;SNF     Equipment Recommendations  Wheelchair (measurements PT);Wheelchair cushion (measurements PT);Rolling walker with 5" wheels    Recommendations for Other Services Rehab consult;OT consult     Precautions / Restrictions Precautions Precautions: Fall Precaution Comments: desats w/ activity also very anxious Restrictions Weight Bearing Restrictions: No    Mobility  Bed Mobility Overal bed mobility: Needs Assistance Bed Mobility: Supine to Sit;Sit to Supine Rolling: Min assist   Supine to sit: Mod assist Sit to supine: Mod assist      Transfers Overall transfer level: Needs assistance Equipment used: 1 person hand held assist Transfers: Sit to/from Stand Sit to Stand: Max assist            Ambulation/Gait             General Gait Details: did not attempt ambulation this session   Stairs              Wheelchair Mobility    Modified Rankin (Stroke Patients Only)       Balance Overall balance assessment: Needs assistance Sitting-balance support: Feet supported Sitting balance-Leahy Scale: Fair       Standing balance-Leahy Scale: Poor                              Cognition Arousal/Alertness: Awake/alert Behavior During Therapy: WFL for tasks assessed/performed Overall Cognitive Status: Within Functional Limits for tasks assessed                                 General Comments: less anxious but when monitor alarms is noted to be apprehensive      Exercises      General Comments        Pertinent Vitals/Pain Pain Assessment: No/denies pain    Home Living                      Prior Function            PT Goals (current goals can now be found in the care plan section) Acute Rehab PT Goals Time For Goal Achievement: 01/08/19 Potential to Achieve Goals: Fair Progress towards PT goals: Not progressing toward goals - comment    Frequency    Min 3X/week  PT Plan Discharge plan needs to be updated    Co-evaluation              AM-PAC PT "6 Clicks" Mobility   Outcome Measure  Help needed turning from your back to your side while in a flat bed without using bedrails?: A Little Help needed moving from lying on your back to sitting on the side of a flat bed without using bedrails?: A Little Help needed moving to and from a bed to a chair (including a wheelchair)?: A Lot Help needed standing up from a chair using your arms (e.g., wheelchair or bedside chair)?: A Lot Help needed to walk in hospital room?: Total Help needed climbing 3-5 steps with a railing? : Total 6 Click Score: 12    End of Session Equipment Utilized During Treatment: Oxygen Activity Tolerance: Treatment limited secondary to medical complications (Comment);Patient limited by fatigue;Patient limited by lethargy Patient left: in  bed;with call bell/phone within reach Nurse Communication: Mobility status PT Visit Diagnosis: Other abnormalities of gait and mobility (R26.89);Muscle weakness (generalized) (M62.81)     Time: [id number] PT Time Calculation (min) (ACUTE ONLY): 24 min  Charges:  $Therapeutic Activity: 23-37 mins                     Horald Chestnut, PT    Delford Field 12/28/2018, 12:58 PM

## 2018-12-28 NOTE — Progress Notes (Addendum)
PROGRESS NOTE    Kim Harrison  [id number] DOB: [birth year] DOA: 12/17/2018 PCP: Lou Miner, MD    Brief Narrative:  63 year old female who presented with dyspnea. She does have significant past medical history of sarcoidosis, hypothyroidism, and dyslipidemia. She reported chills for about 10 days, then she tested negative for COVID-19 (11/29/18), she received antibiotics and steroidswith mild improvement of her symptoms. 6 days later she had recurrent symptoms, she was seen at [facility] tested positive for COVID-19. She was treated as an outpatient with prednisone with no improvement of her symptoms,now with worsening dyspnea associated with generalized weakness, fevers and chills.. 3 days later onNovember 9,she was admitted to the hospital. On her initial physical examination blood pressure 116/73, pulse rate 79, respiratory rate 20, oxygen saturation 93%. Her lungs had no rhonchi or rales, heart S1-S2 present, abdomen soft, no lower extremity edema. Her chest radiograph had interstitial infiltrates, lower zone of right lobe and left lower lobe.EKG 73 bpm, left axis deviation, left anterior fascicular block, right bundle branch block, sinus rhythm, no significant ST segment or T wave changes.  Patient was admitted to the hospitalwith aworking diagnosis of acute hypoxic respiratory failure due to SARS COVID-19 viral pneumonia.  Patient had worsening hypoxic respiratory failure despite treatment with systemic corticosteroids, remdesivir, Actemrax2,and convalescent plasma.  She continued to have high oxygen requirements, 15 L per high flow nasal cannula along with nonrebreather mask. Further work-up with CT chest showed worsening groundglass opacities with septal thickening. Negative for pulmonary embolism.  Her hospitalization has been complicated by severe anxiety and panic attacks.  Slowly progressing, but persistent high oxygen  requirements.  Positive hypotension over last 48 H, with systolic less than [id number], today more sever down to 77 mmHg. Follow up chest film with more dense infiltrate at the left lower lobe, started on Aztreonam and obtained blood cultures.    Assessment & Plan:   Principal Problem:   Acute respiratory failure with hypoxia (HCC) Active Problems:   Pneumonia due to COVID-19 virus   Hypothyroidism   Sarcoidosis   1.Acute hypoxic respiratory failure due to SARS COVID-19 viral pneumonia.Patient has completed medical therapy  for viral pneumonia with 5#5 remdesivir,Actemra x2 and convalescent plasma. Chestimaging (personally reviewed) noted significant lung injury dueSARS COVD 19.  RR: 20 to 26  Pulse oxymetry: 90 to 98%  Fi02: 10 LPM HFNC  Possible NEW sepsis (note present on admission) Patient with worsening dyspnea and fatigue, has developed worsening hypotension. Follow up chest film with worsening infiltrate on the left lower lobe. Suspected developing sepsis (not present on admission), bacterial pneumonia coinfection. Will add broad spectrum antibiotic therapy with Aztreonam (allergic to cephalosporins) and will bolus with saline 500 cc, keep MAP more than 60. Will request echocardiogram. Check blood cultures.  On antitussive agents, bronchodilators, andairway clearing techniques with flutter valve-incentive spirometer.  Patient has been on steroids since admission, about 18 days, possible adrenal insufficiency, will add stress dose steroids with hydrocortisone 100 mg IV q8.   Patient is at high risk for worsening hypotension.   2. Severe anxiety and panic attacks.Continue withlexapro, clonazepam and as needed alprazolam. Patient with no recent panic attacks.   3. Dyslipidemia.On statin .   4. Hypothyroid.On levothyroxine   5. Hypokalemia and hypochloremia/ metabolic alkalosis.His renal function has been stable, will follow on today's renal function, avoid  hypotension or nephrotoxic medications.  6. Sarcoidosis.No clinical signs of acute exacerbation.  7. Constipation. Patient with positive bowel movement.  8. Steroid induced hyperglycemia. Fasting glucose is  122, will continue glucose cover and monitoring with insulin sliding scale.   DVT prophylaxis:fondaparinux Code Status:full Family Communication:I spoke over the phone at the bedside with the patient'shusbandabout patient's condition, plan of care and all questions were addressed Disposition Plan/ discharge barriers:pending clinical improvement.  Body mass index is 24.26 kg/m. Malnutrition Type:  Nutrition Problem: Increased nutrient needs Etiology: acute illness(COVID 19 PNA)   Malnutrition Characteristics:  Signs/Symptoms: estimated needs   Nutrition Interventions:  Interventions: Ensure Enlive (each supplement provides 350kcal and 20 grams of protein), Hormel Shake, Magic cup  RN Pressure Injury Documentation:     Consultants:     Procedures:     Antimicrobials:   Aztreonam 11/28    Subjective: Patient this am very weak and fatigued. Reports worsening dyspnea, no chest pain or cough. No nausea or vomiting, positive bowel movement.   Objective: Vitals:   12/28/18 0500 12/28/18 0614 12/28/18 0734 12/28/18 0743  BP: (!) 90/56  (!) 77/48 (!) 89/64  Pulse: 74  80 76  Resp: 20  (!) 26 20  Temp: 97.8 F (36.6 C)  97.8 F (36.6 C) 97.9 F (36.6 C)  TempSrc: Oral  Oral Oral  SpO2: 93%  90% 98%  Weight:  66.1 kg    Height:        Intake/Output Summary (Last 24 hours) at 12/28/2018 0801 Last data filed at 12/28/2018 [id number] Gross per 24 hour  Intake 480 ml  Output 1000 ml  Net -520 ml   Filed Weights   12/16/18 0500 12/18/18 0700 12/28/18 [id number]  Weight: 63 kg 60.6 kg 66.1 kg    Examination:   General: deconditioned and ill looking appearing  Neurology: Awake and alert, non focal  E ENT: mild pallor, no icterus, oral mucosa moist  Cardiovascular: No JVD. S1-S2 present, rhythmic, no gallops, rubs, or murmurs. No lower extremity edema. Pulmonary:  positive breath sounds bilaterally. Gastrointestinal. Abdomen with no organomegaly, non tender, no rebound or guarding Skin. No rashes Musculoskeletal: no joint deformities     Data Reviewed: I have personally reviewed following labs and imaging studies  CBC: Recent Labs  Lab 12/22/18 0246 12/23/18 0113  WBC 18.1* 18.6*  HGB 15.6* 14.0  HCT 45.6 41.7  MCV 89.4 91.0  PLT 197 0000000   Basic Metabolic Panel: Recent Labs  Lab 12/22/18 0246 12/23/18 0113 12/24/18 0236 12/25/18 0135 12/27/18 0220  NA 133* 134* 135 136 135  K 3.3* 4.1 4.2 4.6 3.7  CL 83* 92* 96* 97* 99  CO2 35* 31 30 29 27   GLUCOSE 177* 192* 150* 197* 122*  BUN 40* 40* 34* 33* 24*  CREATININE 0.99 0.78 0.78 0.80 0.82  CALCIUM 8.5* 7.7* 7.6* 8.1* 7.9*  MG  --  2.6*  --   --   --    GFR: Estimated Creatinine Clearance: 62.4 mL/min (by C-G formula based on SCr of 0.82 mg/dL). Liver Function Tests: Recent Labs  Lab 12/22/18 0246 12/23/18 0113 12/24/18 0236 12/25/18 0135  AST 32 42* 34 36  ALT 109* 103* 94* 97*  ALKPHOS 62 60 46 47  BILITOT 1.1 1.4* 0.7 0.7  PROT 5.3* 4.7* 4.4* 4.4*  ALBUMIN 2.6* 2.4* 2.2* 2.4*   No results for input(s): LIPASE, AMYLASE in the last 168 hours. No results for input(s): AMMONIA in the last 168 hours. Coagulation Profile: No results for input(s): INR, PROTIME in the last 168 hours. Cardiac Enzymes: No results for input(s): CKTOTAL, CKMB, CKMBINDEX, TROPONINI in the last 168 hours. BNP (last 3 results)  No results for input(s): PROBNP in the last 8760 hours. HbA1C: No results for input(s): HGBA1C in the last 72 hours. CBG: Recent Labs  Lab 12/26/18 2104 12/27/18 0746 12/27/18 1140 12/27/18 1604 12/27/18 2009  GLUCAP 173* 85 94 209* 167*   Lipid Profile: No results for input(s): CHOL, HDL, LDLCALC, TRIG, CHOLHDL, LDLDIRECT in the last 72 hours.  Thyroid Function Tests: No results for input(s): TSH, T4TOTAL, FREET4, T3FREE, THYROIDAB in the last 72 hours. Anemia Panel: No results for input(s): VITAMINB12, FOLATE, FERRITIN, TIBC, IRON, RETICCTPCT in the last 72 hours.    Radiology Studies: I have reviewed all of the imaging during this hospital visit personally     Scheduled Meds: . Chlorhexidine Gluconate Cloth  6 each Topical Daily  . cholecalciferol  1,000 Units Oral Daily  . clonazePAM  1 mg Oral BID  . docusate sodium  100 mg Oral BID  . escitalopram  10 mg Oral Daily  . feeding supplement (ENSURE ENLIVE)  237 mL Oral TID WC  . fondaparinux (ARIXTRA) injection  2.5 mg Subcutaneous Q0600  . insulin aspart  0-20 Units Subcutaneous TID WC  . insulin aspart  0-5 Units Subcutaneous QHS  . levothyroxine  75 mcg Oral Daily  . mouth rinse  15 mL Mouth Rinse BID  . pantoprazole  40 mg Oral Daily  . polyethylene glycol  17 g Oral BID  . prednisoLONE  20 mg Oral Daily  . sodium phosphate  1 enema Rectal Once  . vitamin C  500 mg Oral Daily  . zinc sulfate  220 mg Oral Daily   Continuous Infusions:   LOS: 18 days        Shaquasha Gerstel Gerome Apley, MD

## 2018-12-29 ENCOUNTER — Encounter (HOSPITAL_COMMUNITY): Payer: Self-pay | Admitting: Family Medicine

## 2018-12-29 LAB — POCT I-STAT 7, (LYTES, BLD GAS, ICA,H+H)
Acid-Base Excess: 5 mmol/L — ABNORMAL HIGH (ref 0.0–2.0)
Bicarbonate: 28.7 mmol/L — ABNORMAL HIGH (ref 20.0–28.0)
Calcium, Ion: 1.23 mmol/L (ref 1.15–1.40)
HCT: 34 % — ABNORMAL LOW (ref 36.0–46.0)
Hemoglobin: 11.6 g/dL — ABNORMAL LOW (ref 12.0–15.0)
O2 Saturation: 79 %
Patient temperature: 98.6
Potassium: 3.4 mmol/L — ABNORMAL LOW (ref 3.5–5.1)
Sodium: 138 mmol/L (ref 135–145)
TCO2: 30 mmol/L (ref 22–32)
pCO2 arterial: 39.8 mmHg (ref 32.0–48.0)
pH, Arterial: 7.465 — ABNORMAL HIGH (ref 7.350–7.450)
pO2, Arterial: 41 mmHg — ABNORMAL LOW (ref 83.0–108.0)

## 2018-12-29 LAB — CBC WITH DIFFERENTIAL/PLATELET
Abs Immature Granulocytes: 0.09 10*3/uL — ABNORMAL HIGH (ref 0.00–0.07)
Basophils Absolute: 0 10*3/uL (ref 0.0–0.1)
Basophils Relative: 0 %
Eosinophils Absolute: 0 10*3/uL (ref 0.0–0.5)
Eosinophils Relative: 0 %
HCT: 34.7 % — ABNORMAL LOW (ref 36.0–46.0)
Hemoglobin: 11.4 g/dL — ABNORMAL LOW (ref 12.0–15.0)
Immature Granulocytes: 1 %
Lymphocytes Relative: 2 %
Lymphs Abs: 0.2 10*3/uL — ABNORMAL LOW (ref 0.7–4.0)
MCH: 30.8 pg (ref 26.0–34.0)
MCHC: 32.9 g/dL (ref 30.0–36.0)
MCV: 93.8 fL (ref 80.0–100.0)
Monocytes Absolute: 0.2 10*3/uL (ref 0.1–1.0)
Monocytes Relative: 2 %
Neutro Abs: 12 10*3/uL — ABNORMAL HIGH (ref 1.7–7.7)
Neutrophils Relative %: 95 %
Platelets: 118 10*3/uL — ABNORMAL LOW (ref 150–400)
RBC: 3.7 MIL/uL — ABNORMAL LOW (ref 3.87–5.11)
RDW: 13.9 % (ref 11.5–15.5)
WBC: 12.6 10*3/uL — ABNORMAL HIGH (ref 4.0–10.5)
nRBC: 0 % (ref 0.0–0.2)

## 2018-12-29 LAB — D-DIMER, QUANTITATIVE: D-Dimer, Quant: 2.87 ug/mL-FEU — ABNORMAL HIGH (ref 0.00–0.50)

## 2018-12-29 LAB — GLUCOSE, CAPILLARY
Glucose-Capillary: 144 mg/dL — ABNORMAL HIGH (ref 70–99)
Glucose-Capillary: 162 mg/dL — ABNORMAL HIGH (ref 70–99)
Glucose-Capillary: 150 mg/dL — ABNORMAL HIGH (ref 70–99)
Glucose-Capillary: 166 mg/dL — ABNORMAL HIGH (ref 70–99)

## 2018-12-29 LAB — COMPREHENSIVE METABOLIC PANEL
ALT: 47 U/L — ABNORMAL HIGH (ref 0–44)
AST: 29 U/L (ref 15–41)
Albumin: 2.2 g/dL — ABNORMAL LOW (ref 3.5–5.0)
Alkaline Phosphatase: 64 U/L (ref 38–126)
Anion gap: 7 (ref 5–15)
BUN: 24 mg/dL — ABNORMAL HIGH (ref 8–23)
CO2: 26 mmol/L (ref 22–32)
Calcium: 8.1 mg/dL — ABNORMAL LOW (ref 8.9–10.3)
Chloride: 106 mmol/L (ref 98–111)
Creatinine, Ser: 0.66 mg/dL (ref 0.44–1.00)
GFR calc Af Amer: >60 mL/min (ref >60)
GFR calc non Af Amer: >60 mL/min (ref >60)
Glucose, Bld: 108 mg/dL — ABNORMAL HIGH (ref 70–99)
Potassium: 4.2 mmol/L (ref 3.5–5.1)
Sodium: 139 mmol/L (ref 135–145)
Total Bilirubin: 0.6 mg/dL (ref 0.3–1.2)
Total Protein: 4.4 g/dL — ABNORMAL LOW (ref 6.5–8.1)

## 2018-12-29 LAB — C-REACTIVE PROTEIN: CRP: <0.8 mg/dL (ref <1.0)

## 2018-12-29 LAB — FERRITIN: Ferritin: 101 ng/mL (ref 11–307)

## 2018-12-29 MED ORDER — FUROSEMIDE 10 MG/ML IJ SOLN
40.0000 mg | Freq: Once | INTRAMUSCULAR | Status: AC
  Administered 2018-12-29: 40 mg via INTRAVENOUS
  Filled 2018-12-29: qty 4

## 2018-12-29 MED ORDER — HYDROCORTISONE NA SUCCINATE PF 100 MG IJ SOLR
50.0000 mg | Freq: Two times a day (BID) | INTRAMUSCULAR | Status: DC
  Administered 2018-12-29: 50 mg via INTRAVENOUS
  Filled 2018-12-29: qty 2

## 2018-12-29 MED ORDER — FUROSEMIDE 10 MG/ML IJ SOLN
40.0000 mg | Freq: Once | INTRAMUSCULAR | Status: AC
  Administered 2018-12-30: 40 mg via INTRAVENOUS
  Filled 2018-12-29: qty 4

## 2018-12-29 MED ORDER — OXYMETAZOLINE HCL 0.05 % NA SOLN
1.0000 | Freq: Two times a day (BID) | NASAL | Status: DC | PRN
  Administered 2018-12-29: 1 via NASAL
  Filled 2018-12-29: qty 15

## 2018-12-29 NOTE — Progress Notes (Signed)
PROGRESS NOTE  Kim Harrison  [id number] DOB: 11/11/1955 DOA: 12/03/2018 PCP: Lou Miner, MD   Brief Narrative: Kim Harrison is a 63 y.o. female with a history of sarcoidosis, hypothyroidism, and dyslipidemia who presented 11/9 with progressive dyspnea after having been diagnosed w/covid-19 on 10/30 and having completed antibiotics and 2 courses of steroids as an outpatient. CXR demonstrated interstitial infiltrates bilaterally and she required supplemental oxygen. Steroids and remdesivir were given in addition to a dose of convalescent plasma and 2 doses of tocilizumab. She continued requiring 15LPM oxygen supplementation and follow up CT chest revealed worsening GGOs and septal thickening, no PE. Beginning 10/27, she became more hypotensive and CXR demonstrated increasing density of LLL infiltrate, so blood cultures were obtained and aztreonam was started for suspected superimposed bacterial pneumonia.   Assessment & Plan: Principal Problem:   Acute respiratory failure with hypoxia (HCC) Active Problems:   Pneumonia due to COVID-19 virus   Hypothyroidism   Sarcoidosis  Acute hypoxic respiratory failure due to covid-19 pneumonia (POA) complicated by sepsis due to left lower lobe bacterial pneumonia (not POA):  - Completed remdesivir, received tocilizumab x2 and CCP x1 - Continue abx x5 days (11/28 - 12/2) - Has received > 2 weeks steroids. Will intend to taper off if BP allows. Started on stress-dose steroids 11/28 in light of hypotension. Will wean today and monitor closely.  - Echo performed 11/28 with G1DD. Increasing edema, will give lasix x1 and monitor I/O.  - Continue airborne, contact precautions. PPE including surgical gown, gloves, cap, shoe covers, and CAPR used during this encounter in a negative pressure room.  - Check daily labs: CBC, CMP, d-dimer, CRP - On prophylactic anticoagulation. CTA chest ruled out PE.   - Blood cultures drawn 11/28, NGTD -  Maintain euvolemia/net negative.  - Avoid NSAIDs - Recommend proning and aggressive use of incentive spirometry. - Continue antitussives  Hypotension: Unclear etiology, possibly sepsis from bacterial superinfection and/or adrenal insufficiency. Echocardiogram not consistent with cardiogenic etiology. Has hx recurrent syncopal episodes for which she is followed as outpatient, runs lower BPs chronically. - Continue antimicrobials as above - Does not appear intravascularly depleted and no active bleeding based on clinical exam and history. Hgb 11.4, will follow. - Taper steroids and monitor BP  ALT elevation: Likely viral effect.  - Monitor  Thrombocytopenia:  - Will require further monitoring to ensure this isn't dropping further with anticoagulation.   Anxiety, panic disorder:  - Frequent reassurance and contact with family.  - Continue SSRI, clonazepam and prn alprazolam. No sedation noted.  Sarcoidosis: Appears quiescent. Inactive on last CT scan. - Continue outpatient follow up  Steroid-induced hyperglycemia and prediabetes: HbA1c 6.2%.  - Continue SSI while on steroids  - PCP follow up  Hypothyroidism: TSH 0.298.  - Continue synthroid.  - Suggest recheck TFTs outside scope of current illness.  Dyslipidemia:  - Continue statin  Hypokalemia:  - Monitor and supplement as indicated  Hypochloremia:  - Monitor  Metabolic alkalosis:  - Monitor  Constipation: Resolved.   DVT prophylaxis: Fondaparinux  Code Status: Full Family Communication: Husband by phone Disposition Plan: Uncertain  Consultants:   None  Procedures:  Echocardiogram 12/28/2018:  1. Left ventricular ejection fraction, by visual estimation, is 60 to 65%. The left ventricle has normal function. There is no left ventricular hypertrophy.  2. Left ventricular diastolic parameters are consistent with Grade I diastolic dysfunction (impaired relaxation).  3. Global right ventricle has normal systolic  function.The right ventricular size is normal.  4.  Left atrial size was normal.  5. Right atrial size was normal.  6. The mitral valve is normal in structure. Trace mitral valve regurgitation. No evidence of mitral stenosis.  7. The tricuspid valve is normal in structure. Tricuspid valve regurgitation is mild.  8. The aortic valve is tricuspid. Aortic valve regurgitation is not visualized. No evidence of aortic valve sclerosis or stenosis.  9. The pulmonic valve was normal in structure. Pulmonic valve regurgitation is trivial. 10. Mildly elevated pulmonary artery systolic pressure. 11. Technically difficult; normal LV systolic function; grade 1 diastolic dysfunction.  Antimicrobials:  Aztreonam 11/28 - 12/2   Subjective: Feels better today, less short of breath at rest. Has only gotten up once in past few days, would like to get to recliner today though remains constantly, severely weak. No chest pain. Eating ok. No other complaints.   Working w/PT 11/28: 80%'s on 15L HFNC + NRB yest w/sit > stand but feels better  Objective: Vitals:   12/28/18 2008 12/29/18 0001 12/29/18 0330 12/29/18 0746  BP: (!) 93/57 104/67 (!) 89/58 95/61  Pulse: 89 69 75 79  Resp: (!) 21 20 20 20   Temp: 98.2 F (36.8 C) 98.4 F (36.9 C) 98 F (36.7 C) 98 F (36.7 C)  TempSrc: Oral Axillary Oral Oral  SpO2: 92% 96% 92% 90%  Weight:      Height:        Intake/Output Summary (Last 24 hours) at 12/29/2018 1021 Last data filed at 12/29/2018 0953 Gross per 24 hour  Intake 460 ml  Output 500 ml  Net -40 ml   Filed Weights   12/16/18 0500 12/18/18 0700 12/28/18 0614  Weight: 63 kg 60.6 kg 66.1 kg    Gen: 63 y.o. female in no distress Pulm: Non-labored breathing supplemental oxygen. Slight crackles bilaterally.  CV: Regular rate and rhythm. No murmur, rub, or gallop. No JVD, no significant pedal edema. GI: Abdomen soft, non-tender, non-distended, with normoactive bowel sounds. No organomegaly or  masses felt. Ext: Warm, no deformities Skin: No rashes, lesions or ulcers on visualized skin Neuro: Alert and oriented. No focal neurological deficits. Psych: Judgement and insight appear normal. Mood & affect appropriate.   Data Reviewed: I have personally reviewed following labs and imaging studies  CBC: Recent Labs  Lab 12/23/18 0113 12/28/18 1200 12/29/18 0201  WBC 18.6* 12.0* 12.6*  NEUTROABS  --  10.2* 12.0*  HGB 14.0 13.6 11.4*  HCT 41.7 42.3 34.7*  MCV 91.0 95.9 93.8  PLT 152 125* [id number]*   Basic Metabolic Panel: Recent Labs  Lab 12/23/18 0113 12/24/18 0236 12/25/18 0135 12/27/18 0220 12/28/18 1200 12/29/18 0201  NA 134* 135 136 135 136 139  K 4.1 4.2 4.6 3.7 3.7 4.2  CL 92* 96* 97* 99 102 106  CO2 31 30 29 27 27 26   GLUCOSE 192* 150* 197* 122* 117* 108*  BUN 40* 34* 33* 24* 23 24*  CREATININE 0.78 0.78 0.80 0.82 0.79 0.66  CALCIUM 7.7* 7.6* 8.1* 7.9* 7.8* 8.1*  MG 2.6*  --   --   --   --   --    GFR: Estimated Creatinine Clearance: 63.9 mL/min (by C-G formula based on SCr of 0.66 mg/dL). Liver Function Tests: Recent Labs  Lab 12/23/18 0113 12/24/18 0236 12/25/18 0135 12/28/18 1200 12/29/18 0201  AST 42* 34 36 35 29  ALT 103* 94* 97* 57* 47*  ALKPHOS 60 46 47 63 64  BILITOT 1.4* 0.7 0.7 0.9 0.6  PROT 4.7* 4.4* 4.4*  4.8* 4.4*  ALBUMIN 2.4* 2.2* 2.4* 2.5* 2.2*   No results for input(s): LIPASE, AMYLASE in the last 168 hours. No results for input(s): AMMONIA in the last 168 hours. Coagulation Profile: No results for input(s): INR, PROTIME in the last 168 hours. Cardiac Enzymes: No results for input(s): CKTOTAL, CKMB, CKMBINDEX, TROPONINI in the last 168 hours. BNP (last 3 results) No results for input(s): PROBNP in the last 8760 hours. HbA1C: No results for input(s): HGBA1C in the last 72 hours. CBG: Recent Labs  Lab 12/28/18 0733 12/28/18 1144 12/28/18 1609 12/28/18 2134 12/29/18 0751  GLUCAP 94 123* 208* 216* 144*   Lipid Profile: No  results for input(s): CHOL, HDL, LDLCALC, TRIG, CHOLHDL, LDLDIRECT in the last 72 hours. Thyroid Function Tests: No results for input(s): TSH, T4TOTAL, FREET4, T3FREE, THYROIDAB in the last 72 hours. Anemia Panel: Recent Labs    12/28/18 1200 12/29/18 0201  FERRITIN 121 101   Urine analysis: No results found for: COLORURINE, APPEARANCEUR, LABSPEC, PHURINE, GLUCOSEU, HGBUR, BILIRUBINUR, KETONESUR, PROTEINUR, UROBILINOGEN, NITRITE, LEUKOCYTESUR Recent Results (from the past 240 hour(s))  Culture, blood (routine x 2)     Status: None (Preliminary result)   Collection Time: 12/28/18 12:00 PM   Specimen: BLOOD  Result Value Ref Range Status   Specimen Description   Final    BLOOD RIGHT ANTECUBITAL Performed at [facility], [place] [address], [place], [place] [address]    Special Requests   Final    BOTTLES DRAWN AEROBIC ONLY Blood Culture adequate volume Performed at [facility] [address] [place], [place] [address]    Culture   Final    NO GROWTH < 24 HOURS Performed at [facility] [address], [place], [place] [address]    Report Status PENDING  Incomplete  Culture, blood (routine x 2)     Status: None (Preliminary result)   Collection Time: 12/28/18 12:05 PM   Specimen: BLOOD RIGHT HAND  Result Value Ref Range Status   Specimen Description   Final    BLOOD RIGHT HAND Performed at [facility] [address], [place], [place] [address]    Special Requests   Final    BOTTLES DRAWN AEROBIC ONLY Blood Culture results may not be optimal due to an inadequate volume of blood received in culture bottles Performed at [facility] [address] [place], [place] [address]    Culture   Final    NO GROWTH < 24 HOURS Performed at [facility] [address], [place], [place] [address]    Report Status PENDING  Incomplete      Radiology Studies: Dg Chest 1 View  Result Date:  12/28/2018 CLINICAL DATA:  63 year old with current history of sarcoidosis who tested positive for COVID-19 earlier this month and had a CTA chest 2 weeks ago demonstrating worsening ground-glass opacities and septal thickening throughout both lungs, possibly related to COVID or pulmonary edema. Follow-up. EXAM: Portable CHEST 1 VIEW COMPARISON:  Chest x-ray 12/13/2018 and earlier. CTA chest 12/14/2018 and earlier. FINDINGS: The ground-glass opacities and septal thickening throughout both lungs is unchanged since the chest x-ray and CT 2 weeks ago. There are new confluent airspace opacities with air bronchograms in the MEDIAL LEFT LOWER LOBE. No new pulmonary parenchymal abnormalities elsewhere. IMPRESSION: 1. Stable ground-glass opacities throughout both lungs since the chest x-ray and CT 2 weeks ago. 2. New confluent pneumonia in the MEDIAL LEFT LOWER LOBE. Electronically Signed  By: Evangeline Dakin M.D.   On: 12/28/2018 13:30    Scheduled Meds:  Chlorhexidine Gluconate Cloth  6 each Topical Daily   cholecalciferol  1,000 Units Oral Daily   clonazePAM  1 mg Oral BID   docusate sodium  100 mg Oral BID   escitalopram  10 mg Oral Daily   feeding supplement (ENSURE ENLIVE)  237 mL Oral TID WC   fondaparinux (ARIXTRA) injection  2.5 mg Subcutaneous Q0600   hydrocortisone sod succinate (SOLU-CORTEF) inj  100 mg Intravenous Q8H   insulin aspart  0-20 Units Subcutaneous TID WC   insulin aspart  0-5 Units Subcutaneous QHS   levothyroxine  75 mcg Oral Daily   mouth rinse  15 mL Mouth Rinse BID   pantoprazole  40 mg Oral Daily   sodium phosphate  1 enema Rectal Once   vitamin C  500 mg Oral Daily   zinc sulfate  220 mg Oral Daily   Continuous Infusions:  aztreonam 1 g (12/29/18 0533)     LOS: 19 days   Time spent: 35 minutes.  Patrecia Pour, MD [facility] [email or web address] 12/29/2018, 10:21 AM

## 2018-12-29 NOTE — Progress Notes (Signed)
Spoke with patients husband, Shanon Brow, gave progress report on patient activities for the day and answered all the questions that he had at this time.

## 2018-12-29 NOTE — Significant Event (Signed)
Called to the patient's bedside due to acute respiratory distress with hypoxia.  Patient on nonrebreather as well as high flow [place] O2, sats have remained in the mid 80s. -Transfer patient to the ICU -Start heated high flow oxygen and titrate as necessary -Clinically patient appears hypervolemic, will administer additional Lasix dose tonight.

## 2018-12-29 NOTE — Progress Notes (Signed)
Patient MEWS score went to 2 due to respirations at 22.  Patient is not in distress and O2 Sat is 91%.  Informed charge nurse and MD.  Will increase VS frequency per protocol.

## 2018-12-29 NOTE — Progress Notes (Signed)
Patient sat in chair for 4 hours today and tolerated well. Transferred with [facility] lift system to and from chair. O2 Sat continues to go up and down with movement.  Will continue to monitor.

## 2018-12-30 ENCOUNTER — Inpatient Hospital Stay (HOSPITAL_COMMUNITY): Payer: BC Managed Care – PPO

## 2018-12-30 DIAGNOSIS — J189 Pneumonia, unspecified organism: Secondary | ICD-10-CM

## 2018-12-30 DIAGNOSIS — E873 Alkalosis: Secondary | ICD-10-CM | POA: Diagnosis present

## 2018-12-30 DIAGNOSIS — F410 Panic disorder [episodic paroxysmal anxiety] without agoraphobia: Secondary | ICD-10-CM | POA: Diagnosis present

## 2018-12-30 DIAGNOSIS — J80 Acute respiratory distress syndrome: Secondary | ICD-10-CM | POA: Diagnosis not present

## 2018-12-30 DIAGNOSIS — F419 Anxiety disorder, unspecified: Secondary | ICD-10-CM | POA: Diagnosis present

## 2018-12-30 DIAGNOSIS — R7303 Prediabetes: Secondary | ICD-10-CM | POA: Diagnosis present

## 2018-12-30 DIAGNOSIS — J1289 Other viral pneumonia: Secondary | ICD-10-CM | POA: Diagnosis not present

## 2018-12-30 DIAGNOSIS — E038 Other specified hypothyroidism: Secondary | ICD-10-CM

## 2018-12-30 DIAGNOSIS — I959 Hypotension, unspecified: Secondary | ICD-10-CM | POA: Diagnosis present

## 2018-12-30 DIAGNOSIS — J9601 Acute respiratory failure with hypoxia: Secondary | ICD-10-CM | POA: Diagnosis not present

## 2018-12-30 DIAGNOSIS — J069 Acute upper respiratory infection, unspecified: Secondary | ICD-10-CM

## 2018-12-30 DIAGNOSIS — U071 COVID-19: Secondary | ICD-10-CM | POA: Diagnosis not present

## 2018-12-30 LAB — CBC
HCT: 35.1 % — ABNORMAL LOW (ref 36.0–46.0)
Hemoglobin: 11.8 g/dL — ABNORMAL LOW (ref 12.0–15.0)
MCH: 31.6 pg (ref 26.0–34.0)
MCHC: 33.6 g/dL (ref 30.0–36.0)
MCV: 93.9 fL (ref 80.0–100.0)
Platelets: 99 10*3/uL — ABNORMAL LOW (ref 150–400)
RBC: 3.74 MIL/uL — ABNORMAL LOW (ref 3.87–5.11)
RDW: 14.4 % (ref 11.5–15.5)
WBC: 11.9 10*3/uL — ABNORMAL HIGH (ref 4.0–10.5)
nRBC: 0 % (ref 0.0–0.2)

## 2018-12-30 LAB — COMPREHENSIVE METABOLIC PANEL
ALT: 40 U/L (ref 0–44)
AST: 24 U/L (ref 15–41)
Albumin: 2.3 g/dL — ABNORMAL LOW (ref 3.5–5.0)
Alkaline Phosphatase: 86 U/L (ref 38–126)
Anion gap: 9 (ref 5–15)
BUN: 26 mg/dL — ABNORMAL HIGH (ref 8–23)
CO2: 29 mmol/L (ref 22–32)
Calcium: 7.9 mg/dL — ABNORMAL LOW (ref 8.9–10.3)
Chloride: 103 mmol/L (ref 98–111)
Creatinine, Ser: 0.76 mg/dL (ref 0.44–1.00)
GFR calc Af Amer: >60 mL/min (ref >60)
GFR calc non Af Amer: >60 mL/min (ref >60)
Glucose, Bld: 130 mg/dL — ABNORMAL HIGH (ref 70–99)
Potassium: 3.1 mmol/L — ABNORMAL LOW (ref 3.5–5.1)
Sodium: 141 mmol/L (ref 135–145)
Total Bilirubin: 0.4 mg/dL (ref 0.3–1.2)
Total Protein: 4.5 g/dL — ABNORMAL LOW (ref 6.5–8.1)

## 2018-12-30 LAB — PHOSPHORUS: Phosphorus: 5.5 mg/dL — ABNORMAL HIGH (ref 2.5–4.6)

## 2018-12-30 LAB — D-DIMER, QUANTITATIVE: D-Dimer, Quant: 3.14 ug/mL-FEU — ABNORMAL HIGH (ref 0.00–0.50)

## 2018-12-30 LAB — GLUCOSE, CAPILLARY
Glucose-Capillary: 116 mg/dL — ABNORMAL HIGH (ref 70–99)
Glucose-Capillary: 145 mg/dL — ABNORMAL HIGH (ref 70–99)
Glucose-Capillary: 227 mg/dL — ABNORMAL HIGH (ref 70–99)
Glucose-Capillary: 243 mg/dL — ABNORMAL HIGH (ref 70–99)

## 2018-12-30 LAB — MAGNESIUM
Magnesium: 1.9 mg/dL (ref 1.7–2.4)
Magnesium: 2.3 mg/dL (ref 1.7–2.4)

## 2018-12-30 LAB — C-REACTIVE PROTEIN: CRP: <0.8 mg/dL (ref <1.0)

## 2018-12-30 LAB — PROCALCITONIN: Procalcitonin: 0.24 ng/mL

## 2018-12-30 MED ORDER — ORAL CARE MOUTH RINSE
15.0000 mL | OROMUCOSAL | Status: DC
  Administered 2018-12-30 – 2019-01-17 (×172): 15 mL via OROMUCOSAL

## 2018-12-30 MED ORDER — MIDAZOLAM BOLUS VIA INFUSION
1.0000 mg | INTRAVENOUS | Status: DC | PRN
  Administered 2019-01-10 (×2): 1 mg via INTRAVENOUS
  Filled 2018-12-30: qty 2

## 2018-12-30 MED ORDER — MORPHINE SULFATE (PF) 2 MG/ML IV SOLN: 1.0000 mg | INTRAVENOUS | Status: DC | PRN

## 2018-12-30 MED ORDER — STERILE WATER FOR INJECTION IV SOLN
INTRAVENOUS | Status: DC
  Administered 2018-12-30: via INTRAVENOUS
  Filled 2018-12-30 (×5): qty 850

## 2018-12-30 MED ORDER — HYDROCORTISONE NA SUCCINATE PF 100 MG IJ SOLR
100.0000 mg | Freq: Three times a day (TID) | INTRAMUSCULAR | Status: DC
  Administered 2018-12-30 – 2019-01-03 (×13): 100 mg via INTRAVENOUS
  Filled 2018-12-30 (×13): qty 2

## 2018-12-30 MED ORDER — FENTANYL 2500MCG IN NS 250ML (10MCG/ML) PREMIX INFUSION
50.0000 ug/h | INTRAVENOUS | Status: DC
  Administered 2018-12-30: 50 ug/h via INTRAVENOUS
  Administered 2018-12-31 – 2019-01-03 (×8): 200 ug/h via INTRAVENOUS
  Administered 2019-01-04 – 2019-01-05 (×2): 225 ug/h via INTRAVENOUS
  Administered 2019-01-05 – 2019-01-07 (×4): 200 ug/h via INTRAVENOUS
  Filled 2018-12-30 (×16): qty 250

## 2018-12-30 MED ORDER — ARTIFICIAL TEARS OPHTHALMIC OINT
1.0000 "application " | TOPICAL_OINTMENT | Freq: Three times a day (TID) | OPHTHALMIC | Status: DC
  Administered 2018-12-30 – 2019-01-10 (×31): 1 via OPHTHALMIC
  Filled 2018-12-30 (×5): qty 3.5

## 2018-12-30 MED ORDER — SODIUM CHLORIDE 0.9 % IV SOLN
INTRAVENOUS | Status: AC
  Administered 2018-12-30: 11:00:00 via INTRAVENOUS

## 2018-12-30 MED ORDER — ALBUMIN HUMAN 25 % IV SOLN
50.0000 g | Freq: Once | INTRAVENOUS | Status: AC
  Administered 2018-12-30: 50 g via INTRAVENOUS
  Filled 2018-12-30: qty 50

## 2018-12-30 MED ORDER — POTASSIUM CHLORIDE CRYS ER 20 MEQ PO TBCR
50.0000 meq | EXTENDED_RELEASE_TABLET | Freq: Once | ORAL | Status: AC
  Administered 2018-12-30: 50 meq via ORAL
  Filled 2018-12-30: qty 3

## 2018-12-30 MED ORDER — ROCURONIUM BROMIDE 10 MG/ML (PF) SYRINGE
PREFILLED_SYRINGE | INTRAVENOUS | Status: AC
  Administered 2018-12-30: 60 mg
  Filled 2018-12-30: qty 10

## 2018-12-30 MED ORDER — FENTANYL CITRATE (PF) 100 MCG/2ML IJ SOLN
50.0000 ug | Freq: Once | INTRAMUSCULAR | Status: AC
  Administered 2018-12-30: 50 ug via INTRAVENOUS

## 2018-12-30 MED ORDER — SODIUM BICARBONATE 8.4 % IV SOLN
200.0000 meq | Freq: Once | INTRAVENOUS | Status: AC
  Administered 2018-12-30: 200 meq via INTRAVENOUS

## 2018-12-30 MED ORDER — MIDAZOLAM HCL 2 MG/2ML IJ SOLN
INTRAMUSCULAR | Status: AC
  Administered 2018-12-30: 4 mg
  Filled 2018-12-30: qty 4

## 2018-12-30 MED ORDER — FAMOTIDINE IN NACL 20-0.9 MG/50ML-% IV SOLN
20.0000 mg | Freq: Two times a day (BID) | INTRAVENOUS | Status: DC
  Administered 2018-12-30 – 2019-01-04 (×10): 20 mg via INTRAVENOUS
  Filled 2018-12-30 (×10): qty 50

## 2018-12-30 MED ORDER — MIDAZOLAM 50MG/50ML (1MG/ML) PREMIX INFUSION
2.0000 mg/h | INTRAVENOUS | Status: DC
  Administered 2018-12-30: 6 mg/h via INTRAVENOUS
  Administered 2018-12-30: 2 mg/h via INTRAVENOUS
  Administered 2018-12-31 – 2019-01-05 (×10): 4 mg/h via INTRAVENOUS
  Administered 2019-01-06 (×2): 5 mg/h via INTRAVENOUS
  Administered 2019-01-07: 12:00:00 7 mg/h via INTRAVENOUS
  Administered 2019-01-07 – 2019-01-08 (×5): 6 mg/h via INTRAVENOUS
  Administered 2019-01-09: 7 mg/h via INTRAVENOUS
  Administered 2019-01-09 – 2019-01-10 (×3): 6 mg/h via INTRAVENOUS
  Filled 2018-12-30 (×25): qty 50

## 2018-12-30 MED ORDER — PRO-STAT SUGAR FREE PO LIQD
30.0000 mL | Freq: Two times a day (BID) | ORAL | Status: DC
  Administered 2018-12-30 – 2018-12-31 (×2): 30 mL
  Filled 2018-12-30 (×2): qty 30

## 2018-12-30 MED ORDER — SUCCINYLCHOLINE CHLORIDE 200 MG/10ML IV SOSY
PREFILLED_SYRINGE | INTRAVENOUS | Status: AC
  Filled 2018-12-30: qty 10

## 2018-12-30 MED ORDER — CHLORHEXIDINE GLUCONATE 0.12% ORAL RINSE (MEDLINE KIT)
15.0000 mL | Freq: Two times a day (BID) | OROMUCOSAL | Status: DC
  Administered 2018-12-30 – 2019-01-17 (×36): 15 mL via OROMUCOSAL

## 2018-12-30 MED ORDER — FENTANYL CITRATE (PF) 100 MCG/2ML IJ SOLN
INTRAMUSCULAR | Status: AC
  Filled 2018-12-30: qty 2

## 2018-12-30 MED ORDER — FENTANYL BOLUS VIA INFUSION
50.0000 ug | INTRAVENOUS | Status: DC | PRN
  Filled 2018-12-30: qty 50

## 2018-12-30 MED ORDER — NOREPINEPHRINE 4 MG/250ML-% IV SOLN
0.0000 ug/min | INTRAVENOUS | Status: DC
  Administered 2018-12-30: 2 ug/min via INTRAVENOUS
  Administered 2018-12-31: 7 ug/min via INTRAVENOUS
  Administered 2018-12-31: 16:00:00 10 ug/min via INTRAVENOUS
  Administered 2018-12-31: 21 ug/min via INTRAVENOUS
  Administered 2019-01-01: 5 ug/min via INTRAVENOUS
  Administered 2019-01-02: 3 ug/min via INTRAVENOUS
  Administered 2019-01-04: 16:00:00 10 ug/min via INTRAVENOUS
  Administered 2019-01-05: 5 ug/min via INTRAVENOUS
  Administered 2019-01-06: 7 ug/min via INTRAVENOUS
  Administered 2019-01-06: 6 ug/min via INTRAVENOUS
  Filled 2018-12-30 (×11): qty 250

## 2018-12-30 MED ORDER — VECURONIUM BROMIDE 10 MG IV SOLR
0.1000 mg/kg | INTRAVENOUS | Status: DC | PRN
  Administered 2018-12-30 – 2019-01-11 (×6): 6.8 mg via INTRAVENOUS
  Filled 2018-12-30 (×7): qty 10

## 2018-12-30 MED ORDER — SODIUM BICARBONATE 8.4 % IV SOLN
INTRAVENOUS | Status: AC
  Filled 2018-12-30: qty 150

## 2018-12-30 MED ORDER — SODIUM BICARBONATE 8.4 % IV SOLN
INTRAVENOUS | Status: AC
  Administered 2018-12-30: 50 meq via INTRAVENOUS
  Filled 2018-12-30: qty 50

## 2018-12-30 MED ORDER — VITAL HIGH PROTEIN PO LIQD
1000.0000 mL | ORAL | Status: DC
  Administered 2018-12-31: 1000 mL

## 2018-12-30 MED ORDER — SODIUM CHLORIDE 0.9 % IV BOLUS
500.0000 mL | Freq: Once | INTRAVENOUS | Status: AC
  Administered 2018-12-30: 500 mL via INTRAVENOUS

## 2018-12-30 MED ORDER — ETOMIDATE 2 MG/ML IV SOLN
INTRAVENOUS | Status: AC
  Administered 2018-12-30: 20 mg
  Filled 2018-12-30: qty 10

## 2018-12-30 MED ORDER — AMIODARONE IV BOLUS ONLY 150 MG/100ML
150.0000 mg | Freq: Once | INTRAVENOUS | Status: AC
  Administered 2018-12-30: 150 mg via INTRAVENOUS
  Filled 2018-12-30: qty 100

## 2018-12-30 MED ORDER — SODIUM BICARBONATE 8.4 % IV SOLN
INTRAVENOUS | Status: AC
  Administered 2018-12-30: 200 meq via INTRAVENOUS
  Filled 2018-12-30: qty 50

## 2018-12-30 MED ORDER — EPINEPHRINE 1 MG/10ML IJ SOSY
PREFILLED_SYRINGE | INTRAMUSCULAR | Status: AC
  Filled 2018-12-30: qty 10

## 2018-12-30 NOTE — Progress Notes (Signed)
[place] [place] Progress Note Patient Name: Kim Harrison DOB: Dec 19, 1955 MRN: [id number]   Date of Service  12/30/2018  HPI/Events of Note  Placed on 100%/PC 30/Rate 35/P 16 by RT d/t Ppeak > 50 cm H2O. ABG on these settings = 7.04/>97/94  eICU Interventions  Will order: 1. NaHCO3 200 meq IV now.  2. NaHCO3 IV infusion to run IV at 125 mL/hour.  3. Repeat ABG at 2 AM.      Intervention Category Major Interventions: Acid-Base disturbance - evaluation and management;Respiratory failure - evaluation and management  Lochlann Mastrangelo Eugene 12/30/2018, 11:06 PM

## 2018-12-30 NOTE — Progress Notes (Signed)
PT Cancellation Note  Patient Details Name: Aneissa Marze MRN: [id number] DOB: 22-Sep-1955   Cancelled Treatment:    Reason Eval/Treat Not Completed: Medical issues which prohibited therapy, Patient in ICU on Heated HFNC, hypotension. Will check back another day.    Roald Lukacs [place] Pager [phone] Office [phone]  12/30/2018, 7:13 AM

## 2018-12-30 NOTE — Progress Notes (Signed)
Patient remains in prone position. Head repostioned. Arms rotated. ETT remains secured. Tolerated well.

## 2018-12-30 NOTE — Progress Notes (Addendum)
[facility] Progress Note Patient Name: Kim Harrison DOB: 1955-03-28 MRN: [id number]   Date of Service  12/30/2018  HPI/Events of Note  Brief episodes of AFIB with RVR - Now back in sinus tachycardia with HR = 122. Mg++ = 2.3.  eICU Interventions  Will order: 1. BMP STAT. 2. Amiodarone 150 mg IV over 10 minutes now. .      Intervention Category Major Interventions: Arrhythmia - evaluation and management  Dontaye Hur Eugene 12/30/2018, 11:37 PM

## 2018-12-30 NOTE — Progress Notes (Signed)
[facility] Progress Note Patient Name: Kim Harrison DOB: July 02, 1955 MRN: [id number]   Date of Service  12/30/2018  HPI/Events of Note  Ventilator asynchrony - In spite of heavy sedation. Request for PRN NMB.  eICU Interventions  Will order: 1. Vecuronium 6.8 mg IV Q 1 hour PRN ventilator asynchrony.     Intervention Category Major Interventions: Respiratory failure - evaluation and management  Sommer,Steven Eugene 12/30/2018, 8:21 PM

## 2018-12-30 NOTE — Progress Notes (Addendum)
Patient resting with eyes closed most of the night but easily arousable.  Patient hypotensive with 500 ML NS bolus given after talking to MD. Patient given the possibility she maybe intubated

## 2018-12-30 NOTE — Progress Notes (Addendum)
PROGRESS NOTE    Kim Harrison  [id number] DOB: 12/29/1955 DOA: 12/28/2018 PCP: Lou Miner, MD   Brief Narrative:   63 y.o.WF PMHx  sarcoidosis, hypothyroidism, and dyslipidemia   Presented 11/9 with progressive dyspnea after having been diagnosed w/covid-19 on 10/30 and having completed antibiotics and 2 courses of steroids as an outpatient. CXR demonstrated interstitial infiltrates bilaterally and she required supplemental oxygen. Steroids and remdesivir were given in addition to a dose of convalescent plasma and 2 doses of tocilizumab. She continued requiring 15LPM oxygen supplementation and follow up CT chest revealed worsening GGOs and septal thickening, no PE. Beginning 10/27, she became more hypotensive and CXR demonstrated increasing density of LLL infiltrate, so blood cultures were obtained and aztreonam was started for suspected superimposed bacterial pneumonia.     Subjective: Last 24 hrs afebrile A/O x4, negative abdominal pain, negative N/V, negative CP.  Positive SOB   Assessment & Plan:   Principal Problem:   Acute respiratory failure with hypoxia (HCC) Active Problems:   PANIC DISORDER   Pneumonia due to COVID-19 virus   Hypothyroidism   Sarcoidosis   Left lower lobe pneumonia   Hypotension   Hypotension, unspecified   Panic disorder   Anxiety   Prediabetes   Metabolic alkalosis  Covid pneumonia/acute respiratory failure with hypoxia COVID-19 Labs  Recent Labs    12/28/18 1200 12/29/18 0201 12/30/18 0610  DDIMER 3.27* 2.87* 3.14*  FERRITIN 121 101  --   CRP <0.8 <0.8 <0.8   10/30 SARS coronavirus positive -Completed course of Remdesivir -Completed course Actemra x2 -Completed Covid convalescent plasma x1 -Solu-Cortef 100 mg TID -Combivent -Flutter valve -Incentive spirometry -Prone patient 16 hours/day; if cannot tolerate prone 2 to 3 hours per shift -Xanax PRN  -Morphine PRN for MAP<65  Left lower lobe pneumonia (not POA)  -Complete 7-day course antibiotics -Trend procalcitonin  Sarcoidosis -In remission per patient.  Previous CT appears inactive. -Patient will need to follow-up with her pulmonologist upon discharge.  Hypotension -Unclear etiology however patient currently dehydrated -11/30 Albumin 30 g -11/30 normal saline 141ml/hr after albumin administered  Elevated liver enzymes -Resolved  Thrombocytopenia  Results for NOVENA, PATERSON (MRN [id number]) as of 12/30/2018 13:44  Ref. Range 12/22/2018 02:46 12/23/2018 01:13 12/28/2018 12:00 12/29/2018 02:01 12/30/2018 06:10  Platelets Latest Ref Range: 150 - 400 K/uL 197 152 125 (L) 118 (L) 99 (L)  -Patient off all heparin products.  Monitor closely -Currently no sequela of overt bleeding  Anxiety/panic disorder -Xanax 0.5 mg BID PRN -Clonazepam 1 mg BID -Lexapro 10 mg daily  Prediabetes -11/15 hemoglobin A1c= 6.2 -Resistant SSI  HLD -Lipid panel pending  Hypothyroidism -TSH 0.298 -Synthroid 75 mcg daily  Hypokalemia -Potassium goal> 4 -K-Dur 50 mEq  Metabolic alkalosis -Minor monitor closely     DVT prophylaxis: Fondaparinux Code Status: Full Family Communication: 11/30 spoke with Shanon Brow (spouse) counseled on plan of care and answered all questions   Disposition Plan: TBD   Consultants:  PCCM  Procedures/Significant Events:  11/14 CTA chest PE protocol; negative PE -Moderate is worsening of ground-glass and septal thickening since the prior exam could represent viral pneumonia or asymmetric edema superimposed on changes of sarcoidosis.  -Small nodular focus in the left mid chest is likely inflammatory and new since prior exam. 11/30 PCXR;-stable bilateral lung opacities are noted concerning for pneumonia. .   I have personally reviewed and interpreted all radiology studies and my findings are as above.  VENTILATOR SETTINGS: HFNC + NR 11/30 FIO2; 100% Flow; 45 L/min  SPO2; 80%   Cultures 10/30  SARS coronavirus positive 11/28 blood RIGHT antecubital NGTD 11/28 blood RIGHT hand NGTD     Antimicrobials: Anti-infectives (From admission, onward)   Start     Stop   12/28/18 1400  aztreonam (AZACTAM) injection 1 g  Status:  Discontinued     12/28/18 1021   12/28/18 1100  aztreonam (AZACTAM) 1 g in sodium chloride 0.9 % 100 mL IVPB         12/11/18 1000  remdesivir 100 mg in sodium chloride 0.9 % 250 mL IVPB     12/14/18 1120   12/10/18 0300  remdesivir 200 mg in sodium chloride 0.9 % 250 mL IVPB     12/10/18 0727       Devices    LINES / TUBES:      Continuous Infusions: . sodium chloride Stopped (12/30/18 1259)  . aztreonam 1 g (12/30/18 1325)     Objective: Vitals:   12/30/18 1100 12/30/18 1157 12/30/18 1200 12/30/18 1300  BP: (!) 92/48 (!) 93/55 (!) 83/46 (!) 94/54  Pulse: 81 88 80 84  Resp: (!) 24 (!) 26 (!) 36 (!) 30  Temp:    98.3 F (36.8 C)  TempSrc:    Axillary  SpO2: (!) 78% (!) 71% (!) 74% (!) 76%  Weight:      Height:        Intake/Output Summary (Last 24 hours) at 12/30/2018 1405 Last data filed at 12/30/2018 1300 Gross per 24 hour  Intake 668.52 ml  Output 700 ml  Net -31.48 ml   Filed Weights   12/28/18 0614 12/30/18 0030 12/30/18 0412  Weight: 66.1 kg 68.4 kg 68.4 kg    Examination:  General: A/O x4, positive acute respiratory distress cachectic Eyes: negative scleral hemorrhage, negative anisocoria, negative icterus ENT: Negative Runny nose, negative gingival bleeding, Neck:  Negative scars, masses, torticollis, lymphadenopathy, JVD Lungs: Tachypneic, positive decreased breath sounds diffusely, positive bibasilar crackles, negative wheezing. Cardiovascular: Regular rate and rhythm without murmur gallop or rub normal S1 and S2 Abdomen: negative abdominal pain, nondistended, positive soft, bowel sounds, no rebound, no ascites, no appreciable mass Extremities: No significant cyanosis, clubbing, or edema bilateral lower  extremities Skin: Negative rashes, lesions, ulcers Psychiatric:  Negative depression, negative anxiety, negative fatigue, negative mania  Central nervous system:  Cranial nerves II through XII intact, tongue/uvula midline, all extremities muscle strength 5/5, sensation intact throughout, negative dysarthria, negative expressive aphasia, negative receptive aphasia.  .     Data Reviewed: Care during the described time interval was provided by me .  I have reviewed this patient's available data, including medical history, events of note, physical examination, and all test results as part of my evaluation.   CBC: Recent Labs  Lab 12/28/18 1200 12/29/18 0201 12/29/18 2342 12/30/18 0610  WBC 12.0* 12.6*  --  11.9*  NEUTROABS 10.2* 12.0*  --   --   HGB 13.6 11.4* 11.6* 11.8*  HCT 42.3 34.7* 34.0* 35.1*  MCV 95.9 93.8  --  93.9  PLT 125* 118*  --  99*   Basic Metabolic Panel: Recent Labs  Lab 12/25/18 0135 12/27/18 0220 12/28/18 1200 12/29/18 0201 12/29/18 2342 12/30/18 0610  NA 136 135 136 139 138 141  K 4.6 3.7 3.7 4.2 3.4* 3.1*  CL 97* 99 102 106  --  103  CO2 29 27 27 26   --  29  GLUCOSE 197* 122* 117* 108*  --  130*  BUN 33* 24* 23 24*  --  26*  CREATININE 0.80 0.82 0.79 0.66  --  0.76  CALCIUM 8.1* 7.9* 7.8* 8.1*  --  7.9*  MG  --   --   --   --   --  1.9   GFR: Estimated Creatinine Clearance: 69.1 mL/min (by C-G formula based on SCr of 0.76 mg/dL). Liver Function Tests: Recent Labs  Lab 12/24/18 0236 12/25/18 0135 12/28/18 1200 12/29/18 0201 12/30/18 0610  AST 34 36 35 29 24  ALT 94* 97* 57* 47* 40  ALKPHOS 46 47 63 64 86  BILITOT 0.7 0.7 0.9 0.6 0.4  PROT 4.4* 4.4* 4.8* 4.4* 4.5*  ALBUMIN 2.2* 2.4* 2.5* 2.2* 2.3*   No results for input(s): LIPASE, AMYLASE in the last 168 hours. No results for input(s): AMMONIA in the last 168 hours. Coagulation Profile: No results for input(s): INR, PROTIME in the last 168 hours. Cardiac Enzymes: No results for  input(s): CKTOTAL, CKMB, CKMBINDEX, TROPONINI in the last 168 hours. BNP (last 3 results) No results for input(s): PROBNP in the last 8760 hours. HbA1C: No results for input(s): HGBA1C in the last 72 hours. CBG: Recent Labs  Lab 12/29/18 1143 12/29/18 1626 12/29/18 2128 12/30/18 0747 12/30/18 1310  GLUCAP 162* 150* 166* 116* 145*   Lipid Profile: No results for input(s): CHOL, HDL, LDLCALC, TRIG, CHOLHDL, LDLDIRECT in the last 72 hours. Thyroid Function Tests: No results for input(s): TSH, T4TOTAL, FREET4, T3FREE, THYROIDAB in the last 72 hours. Anemia Panel: Recent Labs    12/28/18 1200 12/29/18 0201  FERRITIN 121 101   Urine analysis: No results found for: COLORURINE, APPEARANCEUR, LABSPEC, PHURINE, GLUCOSEU, HGBUR, BILIRUBINUR, KETONESUR, PROTEINUR, UROBILINOGEN, NITRITE, LEUKOCYTESUR Sepsis Labs: @LABRCNTIP (procalcitonin:4,lacticidven:4)  ) Recent Results (from the past 240 hour(s))  Culture, blood (routine x 2)     Status: None (Preliminary result)   Collection Time: 12/28/18 12:00 PM   Specimen: BLOOD  Result Value Ref Range Status   Specimen Description   Final    BLOOD RIGHT ANTECUBITAL Performed at [facility], [place] [address], [place], [place] [address]    Special Requests   Final    BOTTLES DRAWN AEROBIC ONLY Blood Culture adequate volume Performed at [facility] [address], [place], [place] [address]    Culture   Final    NO GROWTH 2 DAYS Performed at [facility] [address] [place], [place] [address]    Report Status PENDING  Incomplete  Culture, blood (routine x 2)     Status: None (Preliminary result)   Collection Time: 12/28/18 12:05 PM   Specimen: BLOOD RIGHT HAND  Result Value Ref Range Status   Specimen Description   Final    BLOOD RIGHT HAND Performed at [facility] [address], [place], [place] [address]    Special Requests   Final    BOTTLES DRAWN AEROBIC ONLY Blood  Culture results may not be optimal due to an inadequate volume of blood received in culture bottles Performed at [facility] [address] [place], [place] [address]    Culture   Final    NO GROWTH 2 DAYS Performed at [facility] [address] [place], [place] [address]    Report Status PENDING  Incomplete         Radiology Studies: Dg Chest Port 1 View  Result Date: 12/30/2018 CLINICAL DATA:  Shortness of breath. EXAM: PORTABLE CHEST 1 VIEW COMPARISON:  December 28, 2018. FINDINGS: Stable cardiomediastinal  silhouette. Stable bilateral lung opacities are noted concerning for pneumonia. No pneumothorax or pleural effusion is noted. Bony thorax is unremarkable. IMPRESSION: Stable bilateral lung opacities are noted concerning for pneumonia. Continued radiographic follow-up is recommended to ensure resolution. Electronically Signed   By: Marijo Conception M.D.   On: 12/30/2018 07:52        Scheduled Meds: . Chlorhexidine Gluconate Cloth  6 each Topical Daily  . cholecalciferol  1,000 Units Oral Daily  . clonazePAM  1 mg Oral BID  . docusate sodium  100 mg Oral BID  . escitalopram  10 mg Oral Daily  . feeding supplement (ENSURE ENLIVE)  237 mL Oral TID WC  . fondaparinux (ARIXTRA) injection  2.5 mg Subcutaneous Q0600  . hydrocortisone sod succinate (SOLU-CORTEF) inj  100 mg Intravenous Q8H  . insulin aspart  0-20 Units Subcutaneous TID WC  . insulin aspart  0-5 Units Subcutaneous QHS  . levothyroxine  75 mcg Oral Daily  . mouth rinse  15 mL Mouth Rinse BID  . pantoprazole  40 mg Oral Daily  . sodium phosphate  1 enema Rectal Once  . vitamin C  500 mg Oral Daily  . zinc sulfate  220 mg Oral Daily   Continuous Infusions: . sodium chloride Stopped (12/30/18 1259)  . aztreonam 1 g (12/30/18 1325)     LOS: 20 days   The patient is critically ill with multiple organ systems failure and requires high complexity decision making for assessment and  support, frequent evaluation and titration of therapies, application of advanced monitoring technologies and extensive interpretation of multiple databases. Critical Care Time devoted to patient care services described in this note  Time spent: 40 minutes     Cherelle Midkiff, Geraldo Docker, MD [facility] Pager [phone]  If 7PM-7AM, please contact night-coverage [email or web address] Password [email or web address] 12/30/2018, 2:05 PM

## 2018-12-30 NOTE — Progress Notes (Signed)
NAME:  Kim Harrison, MRN:  [id number], DOB:  [birth year], LOS: 84 ADMISSION DATE:  12/01/2018, CONSULTATION DATE:  11/30 REFERRING MD:  Bonner Puna, CHIEF COMPLAINT:  Dyspnea   Brief History   63 y/o female diagnosed with COVID on 10/30, admitted to [facility] on 11/9 and treated with tocilizumab, convalescent plasma and steroids.  Admitted ot the ICU on 11/29 in setting of worsening hypoxemia.   History of present illness   See details above.  We have followed her previously while in the ICU, but she was moved to the floor.  There she required high flow oxygen for several days and ultimately was moved back to the ICU on 11/29 for worsening hypoxemia.  During her stay on the floor she had constipation which has now resolved.  She says that her breathing is not too uncomfortable.  She desires mechanical ventilation if necessary.   Past Medical History  Sarcoidosis, Cervical cancer, Hypothyroidism, [facility] Events   11/09 Admit 11/14 Transfer to ICU 11/17 to progressive care 11/29 back to ICU for worsening hypoxemia  Consults:  PCCM  Procedures:  11/30 ETT >  11/30 L IJ CVL >   Significant Diagnostic Tests:  CT angio chest 11/09 >> LN up to 1.3 cm, mild centrilobular emphysema, b/l GGO with interlobular septal thickening CT angio chest 11/14 >> progression of ASD, no PE  Micro Data:  SARS VoV2 11/06 >> Positive  Antimicrobials:  Steroids 11/09 >> Remdesivir 11/09 >> 11/14 Convalescent plasma 11/11 Tocilizumab 11/11 Tocilizumab 11/13  Interim history/subjective:  Says that she feels worse, hot Feels weak  Objective   Blood pressure (!) 87/54, pulse 82, temperature 98.9 F (37.2 C), temperature source Oral, resp. rate (!) 24, height 5\' 5"  (1.651 m), weight 68.4 kg, SpO2 (!) 77 %.    FiO2 (%):  [100 %] 100 %   Intake/Output Summary (Last 24 hours) at 12/30/2018 0750 Last data filed at 12/30/2018 0411 Gross per 24 hour  Intake 721.91 ml  Output 1500 ml  Net  -778.09 ml   Filed Weights   12/28/18 0614 12/30/18 0030 12/30/18 0412  Weight: 66.1 kg 68.4 kg 68.4 kg    Examination:  General:  Increased work of breathing HENT: NCAT OP clear PULM: Crackles  B, normal effort CV: RRR, no mgr GI: BS+, soft, nontender MSK: normal bulk and tone Neuro: awake, alert, no distress, MAEW   Resolved Hospital Problem list     Assessment & Plan:  ARDS due to COVID pneumonia Worsening oxygenation Generally very weak Late stage disease, prognosis poor Intubate now Continue mechanical ventilation per ARDS protocol Target TVol 6-8cc/kgIBW Target Plateau Pressure < 30cm H20 Target driving pressure less than 15 cm of water Target PaO2 55-65: titrate PEEP/FiO2 per protocol As long as PaO2 to FiO2 ratio is less than 1:150 position in prone position for 16 hours a day Check CVP daily if CVL in place Target CVP less than 4, diurese as necessary Ventilator associated pneumonia prevention protocol 11/30 intubate, prone  Need for sedation for mechanical ventilation Intermittent paralytic protocol, RASS target -4 to -5   Best practice:  Diet: start tube feedign Pain/Anxiety/Delirium protocol (if indicated): yes as above VAP protocol (if indicated): yes DVT prophylaxis: lovenox GI prophylaxis: famotidine Glucose control: ssi Mobility: bed rest Code Status: full Family Communication: I updated her husband Shequilla Sallie by phone Disposition: remain in ICU  Labs   CBC: Recent Labs  Lab 12/28/18 1200 12/29/18 0201 12/29/18 2342 12/30/18 0610  WBC 12.0*  12.6*  --  11.9*  NEUTROABS 10.2* 12.0*  --   --   HGB 13.6 11.4* 11.6* 11.8*  HCT 42.3 34.7* 34.0* 35.1*  MCV 95.9 93.8  --  93.9  PLT 125* 118*  --  99*    Basic Metabolic Panel: Recent Labs  Lab 12/25/18 0135 12/27/18 0220 12/28/18 1200 12/29/18 0201 12/29/18 2342 12/30/18 0610  NA 136 135 136 139 138 141  K 4.6 3.7 3.7 4.2 3.4* 3.1*  CL 97* 99 102 106  --  103  CO2 29 27 27  26   --  29  GLUCOSE 197* 122* 117* 108*  --  130*  BUN 33* 24* 23 24*  --  26*  CREATININE 0.80 0.82 0.79 0.66  --  0.76  CALCIUM 8.1* 7.9* 7.8* 8.1*  --  7.9*   GFR: Estimated Creatinine Clearance: 69.1 mL/min (by C-G formula based on SCr of 0.76 mg/dL). Recent Labs  Lab 12/28/18 1200 12/29/18 0201 12/30/18 0610  WBC 12.0* 12.6* 11.9*    Liver Function Tests: Recent Labs  Lab 12/24/18 0236 12/25/18 0135 12/28/18 1200 12/29/18 0201 12/30/18 0610  AST 34 36 35 29 24  ALT 94* 97* 57* 47* 40  ALKPHOS 46 47 63 64 86  BILITOT 0.7 0.7 0.9 0.6 0.4  PROT 4.4* 4.4* 4.8* 4.4* 4.5*  ALBUMIN 2.2* 2.4* 2.5* 2.2* 2.3*   No results for input(s): LIPASE, AMYLASE in the last 168 hours. No results for input(s): AMMONIA in the last 168 hours.  ABG    Component Value Date/Time   PHART 7.465 (H) 12/29/2018 2342   PCO2ART 39.8 12/29/2018 2342   PO2ART 41.0 (L) 12/29/2018 2342   HCO3 28.7 (H) 12/29/2018 2342   TCO2 30 12/29/2018 2342   O2SAT 79.0 12/29/2018 2342     Coagulation Profile: No results for input(s): INR, PROTIME in the last 168 hours.  Cardiac Enzymes: No results for input(s): CKTOTAL, CKMB, CKMBINDEX, TROPONINI in the last 168 hours.  HbA1C: Hgb A1c MFr Bld  Date/Time Value Ref Range Status  12/15/2018 06:27 AM 6.2 (H) 4.8 - 5.6 % Final    Comment:    (NOTE) Pre diabetes:          5.7%-6.4% Diabetes:              >6.4% Glycemic control for   <7.0% adults with diabetes     CBG: Recent Labs  Lab 12/28/18 2134 12/29/18 0751 12/29/18 1143 12/29/18 1626 12/29/18 2128  GLUCAP 216* 144* 162* 150* 166*    Review of Systems:   Gen: Denies fever, chills, weight change, fatigue, night sweats HEENT: Denies blurred vision, double vision, hearing loss, tinnitus, sinus congestion, rhinorrhea, sore throat, neck stiffness, dysphagia PULM: per HPI CV: Denies chest pain, edema, orthopnea, paroxysmal nocturnal dyspnea, palpitations GI: Denies abdominal pain, nausea,  vomiting, diarrhea, hematochezia, melena, constipation, change in bowel habits GU: Denies dysuria, hematuria, polyuria, oliguria, urethral discharge Endocrine: Denies hot or cold intolerance, polyuria, polyphagia or appetite change Derm: Denies rash, dry skin, scaling or peeling skin change Heme: Denies easy bruising, bleeding, bleeding gums Neuro: Denies headache, numbness, weakness, slurred speech, loss of memory or consciousness   Past Medical History  She,  has a past medical history of Cervical cancer (HCC) and Sarcoidosis.   Surgical History    Past Surgical History:  Procedure Laterality Date  . ABDOMINAL HYSTERECTOMY  1989  . [place]     Social History   reports that she quit smoking  about 26 years ago. Her smoking use included cigarettes. She smoked 3.00 packs per day for 0.00 years. She has never used smokeless tobacco. She reports current alcohol use. She reports that she does not use drugs.   Family History   Her family history includes Allergies in her mother; Autoimmune disease in her cousin; Breast cancer in her mother; Lung cancer in her cousin, maternal grandfather, maternal uncle, maternal uncle, and maternal uncle; Multiple sclerosis in her sister; Pulmonary fibrosis in her mother; Rheum arthritis in her mother.   Allergies Allergies  Allergen Reactions  . 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  . Cephalexin Anaphylaxis  . Penicillins Anaphylaxis    Did it involve swelling of the face/tongue/throat, SOB, or low BP? Yes Did it involve sudden or severe rash/hives, skin peeling, or any reaction on the inside of your mouth or nose? No Did you need to seek medical attention at a hospital or doctor's office? Yes When did it last happen?couple of years ago If all above answers are "NO", may proceed with cephalosporin use.   Marland Kitchen Beef-Derived Products   . Pork-Derived Products   . Sulfonamide Derivatives Hives     Home  Medications  Prior to Admission medications   Medication Sig Start Date End Date Taking? Authorizing Provider  cholecalciferol (VITAMIN D3) 25 MCG (1000 UT) tablet Take 1,000 Units by mouth daily.   Yes [provider]  DIGESTIVE ENZYMES PO Take 1 capsule by mouth daily.   Yes [provider]  EPINEPHrine (EPIPEN 2-PAK) 0.3 mg/0.3 mL IJ SOAJ injection Inject 0.3 mg into the muscle once.   Yes [provider]  Multiple Vitamins-Minerals (ZINC PO) Take 1 tablet by mouth daily.   Yes [provider]  NEXIUM 40 MG capsule Take 1 capsule by mouth daily. 05/13/13  Yes [provider]  Probiotic Product (PROBIOTIC PO) Take 1 capsule by mouth daily.   Yes [provider]  SYNTHROID 75 MCG tablet Take 1 tablet by mouth daily. 05/13/13  Yes [provider]  VENTOLIN HFA 108 (90 Base) MCG/ACT inhaler Inhale 2 puffs into the lungs every 4 (four) hours as needed for shortness of breath. 12/01/18  Yes [provider]     Critical care time: 40 minutes     Roselie Awkward, MD [facility] Pager: [phone] Cell: [phone] If no response, call [phone]

## 2018-12-30 NOTE — Progress Notes (Signed)
RT Note:  ABG drawn on PRVC 340-28-100% +16  Results repeated X2 with results of  PH 7.19 Co2 >97 Po2 87  Peak pressures >50 Plat >40  Changed to PCV PC 30 Rate 35 100% +16  ABG repeated  PH 7.04 Co2 >97 Po2 94  HCO3 undetectable on all results. [place] notified.

## 2018-12-30 NOTE — Procedures (Signed)
Central Venous Catheter Insertion Procedure Note Romell Hickson [id number] Jun 26, 1955  Procedure: Insertion of Central Venous Catheter Indications: Assessment of intravascular volume  Procedure Details Consent: Unable to obtain consent because of emergent medical necessity. Time Out: Verified patient identification, verified procedure, site/side was marked, verified correct patient position, special equipment/implants available, medications/allergies/relevent history reviewed, required imaging and test results available.  Performed  Maximum sterile technique was used including antiseptics, cap, gloves, gown, hand hygiene, mask and sheet. Skin prep: Chlorhexidine; local anesthetic administered A antimicrobial bonded/coated triple lumen catheter was placed in the left internal jugular vein using the Seldinger technique.  Ultrasound was used to verify the patency of the vein and for real time needle guidance.  Evaluation Blood flow good Complications: No apparent complications Patient did tolerate procedure well. Chest X-ray ordered to verify placement.  CXR: pending.  Simonne Maffucci 12/30/2018, 5:19 PM

## 2018-12-30 NOTE — Procedures (Signed)
Intubation Procedure Note Kim Harrison [id number] 1955/03/11  Procedure: Intubation Indications: Respiratory insufficiency  Procedure Details Consent: Risks of procedure as well as the alternatives and risks of each were explained to the (patient/caregiver).  Consent for procedure obtained. Time Out: Verified patient identification, verified procedure, site/side was marked, verified correct patient position, special equipment/implants available, medications/allergies/relevent history reviewed, required imaging and test results available.  Performed  Drugs Versed 61m Fentanyl 548m, etomidate 2061mocuronium 36m5m x 1 with MAC 3 blade Grade 1 view 7.5 ET tube passed through cords under direct visualization Placement confirmed with bilateral breath sounds, positive EtCO2 change and smoke in tube   Evaluation Hemodynamic Status: BP stable throughout; O2 sats: transiently fell during during procedure Patient's Current Condition: stable Complications: No apparent complications Patient did tolerate procedure well. Chest X-ray ordered to verify placement.  CXR: pending.   [name]30/2020

## 2018-12-30 NOTE — Progress Notes (Signed)
[facility] Progress Note Patient Name: Kim Harrison DOB: 1955/08/07 MRN: [id number]   Date of Service  12/30/2018  HPI/Events of Note  Hypotension - Diuresed with Lasix X 2 yesterday. BP = 75/47 with MAP = 55.  eICU Interventions  Will order: 1. Bolus with 0.9 NaCl 500 mL IV over 1 hour now.      Intervention Category Major Interventions: Hypotension - evaluation and management  Jerre Diguglielmo Eugene 12/30/2018, 3:53 AM

## 2018-12-30 NOTE — Plan of Care (Signed)
Discussed plan of care for the evening with patient, pain management and practicing deep breathing/relaxation techniques with some teach back displayed at this time.

## 2018-12-30 NOTE — Progress Notes (Signed)
Report received from Mauri Reading RN from 1E 107; patient was transferred to ICU for heated HFNC d/t low oxygen saturation stats and  ABG levels.  Patients belongs brought by Agricultural consultant from PCU.

## 2018-12-30 NOTE — Progress Notes (Signed)
Pt proned at this time with no complications. Head turned to left. Moderate amount of clear/yellow secretions suctioned out orally and nasally in addition to a small blood clot nasally.

## 2018-12-30 NOTE — Progress Notes (Signed)
Patient has 2 gold rings that have been removed. One ring is a gold band One ring is a gold band with diamonds in it. The rings were collected in a container and taken to the charge nurse, Estill Bamberg to take to the safe.

## 2018-12-30 NOTE — Progress Notes (Signed)
Spoke to patient's husband and gave him updates and answered all questions regarding patient care.

## 2018-12-30 NOTE — Progress Notes (Signed)
Head repositioned. Arms rotated. ETT remains secure. Patient became tachycardiac.

## 2018-12-30 NOTE — Progress Notes (Signed)
Called to Rapid Response due to desaturation. Patient on 15L HFNC and NRB.  ABG obtained. Patient transferred to ICU placed on 40L HHFNC, 100% with NRB.

## 2018-12-31 DIAGNOSIS — U071 COVID-19: Secondary | ICD-10-CM | POA: Diagnosis not present

## 2018-12-31 DIAGNOSIS — F410 Panic disorder [episodic paroxysmal anxiety] without agoraphobia: Secondary | ICD-10-CM

## 2018-12-31 DIAGNOSIS — E873 Alkalosis: Secondary | ICD-10-CM

## 2018-12-31 DIAGNOSIS — J9601 Acute respiratory failure with hypoxia: Secondary | ICD-10-CM | POA: Diagnosis not present

## 2018-12-31 DIAGNOSIS — I9589 Other hypotension: Secondary | ICD-10-CM | POA: Diagnosis not present

## 2018-12-31 LAB — POCT I-STAT 7, (LYTES, BLD GAS, ICA,H+H)
Acid-Base Excess: 16 mmol/L — ABNORMAL HIGH (ref 0.0–2.0)
Acid-Base Excess: 21 mmol/L — ABNORMAL HIGH (ref 0.0–2.0)
Acid-Base Excess: 22 mmol/L — ABNORMAL HIGH (ref 0.0–2.0)
Bicarbonate: 45.8 mmol/L — ABNORMAL HIGH (ref 20.0–28.0)
Bicarbonate: 49.9 mmol/L — ABNORMAL HIGH (ref 20.0–28.0)
Bicarbonate: 50.9 mmol/L — ABNORMAL HIGH (ref 20.0–28.0)
Calcium, Ion: 1.18 mmol/L (ref 1.15–1.40)
Calcium, Ion: 1.18 mmol/L (ref 1.15–1.40)
Calcium, Ion: 1.22 mmol/L (ref 1.15–1.40)
Calcium, Ion: 1.08 mmol/L — ABNORMAL LOW (ref 1.15–1.40)
Calcium, Ion: 0.94 mmol/L — ABNORMAL LOW (ref 1.15–1.40)
Calcium, Ion: 0.98 mmol/L — ABNORMAL LOW (ref 1.15–1.40)
Calcium, Ion: 0.96 mmol/L — ABNORMAL LOW (ref 1.15–1.40)
HCT: 36 % (ref 36.0–46.0)
HCT: 36 % (ref 36.0–46.0)
HCT: 37 % (ref 36.0–46.0)
HCT: 37 % (ref 36.0–46.0)
HCT: 35 % — ABNORMAL LOW (ref 36.0–46.0)
HCT: 33 % — ABNORMAL LOW (ref 36.0–46.0)
HCT: 32 % — ABNORMAL LOW (ref 36.0–46.0)
Hemoglobin: 12.2 g/dL (ref 12.0–15.0)
Hemoglobin: 12.2 g/dL (ref 12.0–15.0)
Hemoglobin: 12.6 g/dL (ref 12.0–15.0)
Hemoglobin: 12.6 g/dL (ref 12.0–15.0)
Hemoglobin: 11.9 g/dL — ABNORMAL LOW (ref 12.0–15.0)
Hemoglobin: 11.2 g/dL — ABNORMAL LOW (ref 12.0–15.0)
Hemoglobin: 10.9 g/dL — ABNORMAL LOW (ref 12.0–15.0)
O2 Saturation: 74 %
O2 Saturation: 91 %
O2 Saturation: 93 %
Patient temperature: 36.9
Patient temperature: 36.9
Patient temperature: 36.5
Patient temperature: 97.6
Patient temperature: 98
Patient temperature: 97.59
Patient temperature: 98.6
Potassium: 4.6 mmol/L (ref 3.5–5.1)
Potassium: 4.7 mmol/L (ref 3.5–5.1)
Potassium: 4.8 mmol/L (ref 3.5–5.1)
Potassium: 4.4 mmol/L (ref 3.5–5.1)
Potassium: 4.2 mmol/L (ref 3.5–5.1)
Potassium: 3.6 mmol/L (ref 3.5–5.1)
Potassium: 3.4 mmol/L — ABNORMAL LOW (ref 3.5–5.1)
Sodium: 141 mmol/L (ref 135–145)
Sodium: 141 mmol/L (ref 135–145)
Sodium: 141 mmol/L (ref 135–145)
Sodium: 145 mmol/L (ref 135–145)
Sodium: 148 mmol/L — ABNORMAL HIGH (ref 135–145)
Sodium: 148 mmol/L — ABNORMAL HIGH (ref 135–145)
Sodium: 148 mmol/L — ABNORMAL HIGH (ref 135–145)
TCO2: 48 mmol/L — ABNORMAL HIGH (ref 22–32)
TCO2: >50 mmol/L — ABNORMAL HIGH (ref 22–32)
TCO2: >50 mmol/L — ABNORMAL HIGH (ref 22–32)
pCO2 arterial: >97 mmHg (ref 32.0–48.0)
pCO2 arterial: >97 mmHg (ref 32.0–48.0)
pCO2 arterial: >97 mmHg (ref 32.0–48.0)
pCO2 arterial: >97 mmHg (ref 32.0–48.0)
pCO2 arterial: 86.8 mmHg (ref 32.0–48.0)
pCO2 arterial: 79 mmHg (ref 32.0–48.0)
pCO2 arterial: 79.2 mmHg (ref 32.0–48.0)
pH, Arterial: 7.12 — CL (ref 7.350–7.450)
pH, Arterial: 7.127 — CL (ref 7.350–7.450)
pH, Arterial: 7.046 — CL (ref 7.350–7.450)
pH, Arterial: 7.028 — CL (ref 7.350–7.450)
pH, Arterial: 7.329 — ABNORMAL LOW (ref 7.350–7.450)
pH, Arterial: 7.406 (ref 7.350–7.450)
pH, Arterial: 7.416 (ref 7.350–7.450)
pO2, Arterial: 78 mmHg — ABNORMAL LOW (ref 83.0–108.0)
pO2, Arterial: 86 mmHg (ref 83.0–108.0)
pO2, Arterial: 94 mmHg (ref 83.0–108.0)
pO2, Arterial: 69 mmHg — ABNORMAL LOW (ref 83.0–108.0)
pO2, Arterial: 44 mmHg — ABNORMAL LOW (ref 83.0–108.0)
pO2, Arterial: 63 mmHg — ABNORMAL LOW (ref 83.0–108.0)
pO2, Arterial: 73 mmHg — ABNORMAL LOW (ref 83.0–108.0)

## 2018-12-31 LAB — MAGNESIUM
Magnesium: 2 mg/dL (ref 1.7–2.4)
Magnesium: 2 mg/dL (ref 1.7–2.4)

## 2018-12-31 LAB — GLUCOSE, CAPILLARY
Glucose-Capillary: 241 mg/dL — ABNORMAL HIGH (ref 70–99)
Glucose-Capillary: 220 mg/dL — ABNORMAL HIGH (ref 70–99)
Glucose-Capillary: 208 mg/dL — ABNORMAL HIGH (ref 70–99)
Glucose-Capillary: 166 mg/dL — ABNORMAL HIGH (ref 70–99)
Glucose-Capillary: 121 mg/dL — ABNORMAL HIGH (ref 70–99)
Glucose-Capillary: 146 mg/dL — ABNORMAL HIGH (ref 70–99)

## 2018-12-31 LAB — D-DIMER, QUANTITATIVE: D-Dimer, Quant: 3.61 ug/mL-FEU — ABNORMAL HIGH (ref 0.00–0.50)

## 2018-12-31 LAB — CBC WITH DIFFERENTIAL/PLATELET
Abs Immature Granulocytes: 0.15 10*3/uL — ABNORMAL HIGH (ref 0.00–0.07)
Basophils Absolute: 0.1 10*3/uL (ref 0.0–0.1)
Basophils Relative: 0 %
Eosinophils Absolute: 0 10*3/uL (ref 0.0–0.5)
Eosinophils Relative: 0 %
HCT: 36.2 % (ref 36.0–46.0)
Hemoglobin: 11.1 g/dL — ABNORMAL LOW (ref 12.0–15.0)
Immature Granulocytes: 1 %
Lymphocytes Relative: 1 %
Lymphs Abs: 0.3 10*3/uL — ABNORMAL LOW (ref 0.7–4.0)
MCH: 31 pg (ref 26.0–34.0)
MCHC: 30.7 g/dL (ref 30.0–36.0)
MCV: 101.1 fL — ABNORMAL HIGH (ref 80.0–100.0)
Monocytes Absolute: 0.8 10*3/uL (ref 0.1–1.0)
Monocytes Relative: 4 %
Neutro Abs: 17.6 10*3/uL — ABNORMAL HIGH (ref 1.7–7.7)
Neutrophils Relative %: 94 %
Platelets: 103 10*3/uL — ABNORMAL LOW (ref 150–400)
RBC: 3.58 MIL/uL — ABNORMAL LOW (ref 3.87–5.11)
RDW: 15.1 % (ref 11.5–15.5)
WBC: 18.8 10*3/uL — ABNORMAL HIGH (ref 4.0–10.5)
nRBC: 0.1 % (ref 0.0–0.2)

## 2018-12-31 LAB — BASIC METABOLIC PANEL
Anion gap: 10 (ref 5–15)
BUN: 33 mg/dL — ABNORMAL HIGH (ref 8–23)
CO2: 40 mmol/L — ABNORMAL HIGH (ref 22–32)
Calcium: 7.4 mg/dL — ABNORMAL LOW (ref 8.9–10.3)
Chloride: 99 mmol/L (ref 98–111)
Creatinine, Ser: 1.15 mg/dL — ABNORMAL HIGH (ref 0.44–1.00)
GFR calc Af Amer: 58 mL/min — ABNORMAL LOW (ref >60)
GFR calc non Af Amer: 50 mL/min — ABNORMAL LOW (ref >60)
Glucose, Bld: 323 mg/dL — ABNORMAL HIGH (ref 70–99)
Potassium: 4.5 mmol/L (ref 3.5–5.1)
Sodium: 149 mmol/L — ABNORMAL HIGH (ref 135–145)

## 2018-12-31 LAB — LIPID PANEL
Cholesterol: 140 mg/dL (ref 0–200)
HDL: 35 mg/dL — ABNORMAL LOW (ref >40)
LDL Cholesterol: 89 mg/dL (ref 0–99)
Total CHOL/HDL Ratio: 4 RATIO
Triglycerides: 82 mg/dL (ref <150)
VLDL: 16 mg/dL (ref 0–40)

## 2018-12-31 LAB — COMPREHENSIVE METABOLIC PANEL
ALT: 187 U/L — ABNORMAL HIGH (ref 0–44)
AST: 110 U/L — ABNORMAL HIGH (ref 15–41)
Albumin: 2.9 g/dL — ABNORMAL LOW (ref 3.5–5.0)
Alkaline Phosphatase: 108 U/L (ref 38–126)
Anion gap: 14 (ref 5–15)
BUN: 31 mg/dL — ABNORMAL HIGH (ref 8–23)
CO2: 41 mmol/L — ABNORMAL HIGH (ref 22–32)
Calcium: 7 mg/dL — ABNORMAL LOW (ref 8.9–10.3)
Chloride: 97 mmol/L — ABNORMAL LOW (ref 98–111)
Creatinine, Ser: 1.13 mg/dL — ABNORMAL HIGH (ref 0.44–1.00)
GFR calc Af Amer: 59 mL/min — ABNORMAL LOW (ref >60)
GFR calc non Af Amer: 51 mL/min — ABNORMAL LOW (ref >60)
Glucose, Bld: 231 mg/dL — ABNORMAL HIGH (ref 70–99)
Potassium: 4.4 mmol/L (ref 3.5–5.1)
Sodium: 152 mmol/L — ABNORMAL HIGH (ref 135–145)
Total Bilirubin: 1.5 mg/dL — ABNORMAL HIGH (ref 0.3–1.2)
Total Protein: 4.6 g/dL — ABNORMAL LOW (ref 6.5–8.1)

## 2018-12-31 LAB — PHOSPHORUS
Phosphorus: 8.1 mg/dL — ABNORMAL HIGH (ref 2.5–4.6)
Phosphorus: 3.1 mg/dL (ref 2.5–4.6)

## 2018-12-31 LAB — LACTATE DEHYDROGENASE: LDH: 490 U/L — ABNORMAL HIGH (ref 98–192)

## 2018-12-31 LAB — FERRITIN: Ferritin: 128 ng/mL (ref 11–307)

## 2018-12-31 LAB — PROCALCITONIN: Procalcitonin: 0.35 ng/mL

## 2018-12-31 LAB — C-REACTIVE PROTEIN: CRP: <0.8 mg/dL (ref <1.0)

## 2018-12-31 MED ORDER — SODIUM BICARBONATE 8.4 % IV SOLN
200.0000 meq | Freq: Once | INTRAVENOUS | Status: AC
  Administered 2018-12-30: 17:00:00 50 meq via INTRAVENOUS
  Administered 2018-12-31: 03:00:00 200 meq via INTRAVENOUS

## 2018-12-31 MED ORDER — VITAMIN D 25 MCG (1000 UNIT) PO TABS
1000.0000 [IU] | ORAL_TABLET | Freq: Every day | ORAL | Status: DC
  Administered 2018-12-31 – 2019-01-16 (×17): 1000 [IU]
  Filled 2018-12-31 (×18): qty 1

## 2018-12-31 MED ORDER — SODIUM CHLORIDE 0.9 % IV SOLN
2.0000 g | Freq: Three times a day (TID) | INTRAVENOUS | Status: AC
  Administered 2019-01-01 – 2019-01-03 (×10): 2 g via INTRAVENOUS
  Filled 2018-12-31 (×10): qty 2

## 2018-12-31 MED ORDER — INSULIN ASPART 100 UNIT/ML ~~LOC~~ SOLN
0.0000 [IU] | SUBCUTANEOUS | Status: DC
  Administered 2018-12-31: 10:00:00 5 [IU] via SUBCUTANEOUS
  Administered 2018-12-31 (×2): 2 [IU] via SUBCUTANEOUS
  Administered 2018-12-31: 3 [IU] via SUBCUTANEOUS
  Administered 2019-01-01: 5 [IU] via SUBCUTANEOUS
  Administered 2019-01-01 (×3): 2 [IU] via SUBCUTANEOUS
  Administered 2019-01-01: 3 [IU] via SUBCUTANEOUS
  Administered 2019-01-02 (×2): 5 [IU] via SUBCUTANEOUS
  Administered 2019-01-02: 08:00:00 2 [IU] via SUBCUTANEOUS
  Administered 2019-01-02: 5 [IU] via SUBCUTANEOUS
  Administered 2019-01-02: 04:00:00 3 [IU] via SUBCUTANEOUS
  Administered 2019-01-02: 5 [IU] via SUBCUTANEOUS
  Administered 2019-01-03: 3 [IU] via SUBCUTANEOUS
  Administered 2019-01-03: 5 [IU] via SUBCUTANEOUS
  Administered 2019-01-03 – 2019-01-04 (×5): 3 [IU] via SUBCUTANEOUS
  Administered 2019-01-04: 2 [IU] via SUBCUTANEOUS
  Administered 2019-01-04 (×2): 3 [IU] via SUBCUTANEOUS
  Administered 2019-01-05 (×4): 2 [IU] via SUBCUTANEOUS
  Administered 2019-01-06 (×2): 3 [IU] via SUBCUTANEOUS
  Administered 2019-01-06 (×2): 2 [IU] via SUBCUTANEOUS
  Administered 2019-01-07 (×3): 3 [IU] via SUBCUTANEOUS
  Administered 2019-01-07: 16:00:00 5 [IU] via SUBCUTANEOUS
  Administered 2019-01-07: 09:00:00 3 [IU] via SUBCUTANEOUS
  Administered 2019-01-07: 5 [IU] via SUBCUTANEOUS
  Administered 2019-01-08: 16:00:00 2 [IU] via SUBCUTANEOUS
  Administered 2019-01-08 (×2): 3 [IU] via SUBCUTANEOUS
  Administered 2019-01-09: 13:00:00 100 [IU] via SUBCUTANEOUS
  Administered 2019-01-09 (×2): 2 [IU] via SUBCUTANEOUS
  Administered 2019-01-09: 3 [IU] via SUBCUTANEOUS
  Administered 2019-01-09: 20:00:00 2 [IU] via SUBCUTANEOUS
  Administered 2019-01-10: 3 [IU] via SUBCUTANEOUS
  Administered 2019-01-10: 04:00:00 2 [IU] via SUBCUTANEOUS
  Administered 2019-01-10: 3 [IU] via SUBCUTANEOUS
  Administered 2019-01-11 – 2019-01-12 (×6): 2 [IU] via SUBCUTANEOUS
  Administered 2019-01-12: 3 [IU] via SUBCUTANEOUS
  Administered 2019-01-12: 2 [IU] via SUBCUTANEOUS
  Administered 2019-01-12: 3 [IU] via SUBCUTANEOUS
  Administered 2019-01-13 (×2): 2 [IU] via SUBCUTANEOUS
  Administered 2019-01-13 (×2): 3 [IU] via SUBCUTANEOUS
  Administered 2019-01-13 – 2019-01-14 (×4): 2 [IU] via SUBCUTANEOUS
  Administered 2019-01-14 (×2): 3 [IU] via SUBCUTANEOUS
  Administered 2019-01-15: 2 [IU] via SUBCUTANEOUS
  Administered 2019-01-15: 3 [IU] via SUBCUTANEOUS
  Administered 2019-01-15: 5 [IU] via SUBCUTANEOUS
  Administered 2019-01-15 (×2): 2 [IU] via SUBCUTANEOUS
  Administered 2019-01-16: 5 [IU] via SUBCUTANEOUS
  Administered 2019-01-16: 3 [IU] via SUBCUTANEOUS
  Administered 2019-01-16: 11 [IU] via SUBCUTANEOUS
  Administered 2019-01-16 (×3): 3 [IU] via SUBCUTANEOUS
  Administered 2019-01-17: 2 [IU] via SUBCUTANEOUS
  Administered 2019-01-17: 3 [IU] via SUBCUTANEOUS
  Administered 2019-01-17: 8 [IU] via SUBCUTANEOUS

## 2018-12-31 MED ORDER — VITAL 1.5 CAL PO LIQD
1000.0000 mL | ORAL | Status: DC
  Administered 2018-12-31 – 2019-01-16 (×16): 1000 mL

## 2018-12-31 MED ORDER — SODIUM BICARBONATE 8.4 % IV SOLN
INTRAVENOUS | Status: AC
  Administered 2018-12-31: 200 meq via INTRAVENOUS
  Filled 2018-12-31: qty 200

## 2018-12-31 MED ORDER — PRO-STAT SUGAR FREE PO LIQD
30.0000 mL | Freq: Three times a day (TID) | ORAL | Status: DC
  Administered 2018-12-31 – 2019-01-07 (×21): 30 mL
  Filled 2018-12-31 (×21): qty 30

## 2018-12-31 MED ORDER — INSULIN ASPART 100 UNIT/ML ~~LOC~~ SOLN
3.0000 [IU] | SUBCUTANEOUS | Status: DC
  Administered 2018-12-31 – 2019-01-01 (×5): 3 [IU] via SUBCUTANEOUS

## 2018-12-31 MED ORDER — ALPRAZOLAM 0.25 MG PO TABS: 0.5000 mg | ORAL_TABLET | Freq: Two times a day (BID) | ORAL | Status: DC | PRN

## 2018-12-31 MED ORDER — ZINC SULFATE 220 (50 ZN) MG PO CAPS
220.0000 mg | ORAL_CAPSULE | Freq: Every day | ORAL | Status: DC
  Administered 2018-12-31 – 2019-01-05 (×6): 220 mg
  Filled 2018-12-31 (×6): qty 1

## 2018-12-31 MED ORDER — INSULIN ASPART 100 UNIT/ML ~~LOC~~ SOLN
0.0000 [IU] | Freq: Four times a day (QID) | SUBCUTANEOUS | Status: DC
  Administered 2018-12-31: 7 [IU] via SUBCUTANEOUS

## 2018-12-31 MED ORDER — PHENYLEPHRINE HCL-NACL 10-0.9 MG/250ML-% IV SOLN
0.0000 ug/min | INTRAVENOUS | Status: DC
  Administered 2018-12-31: 20 ug/min via INTRAVENOUS
  Filled 2018-12-31 (×3): qty 250

## 2018-12-31 MED ORDER — LEVOTHYROXINE SODIUM 75 MCG PO TABS
75.0000 ug | ORAL_TABLET | Freq: Every day | ORAL | Status: DC
  Administered 2019-01-01 – 2019-01-17 (×17): 75 ug
  Filled 2018-12-31 (×19): qty 1

## 2018-12-31 MED ORDER — DOCUSATE SODIUM 50 MG/5ML PO LIQD
100.0000 mg | Freq: Two times a day (BID) | ORAL | Status: DC
  Administered 2018-12-31 – 2019-01-02 (×5): 100 mg
  Filled 2018-12-31 (×5): qty 10

## 2018-12-31 MED ORDER — INSULIN GLARGINE 100 UNIT/ML ~~LOC~~ SOLN
5.0000 [IU] | Freq: Every day | SUBCUTANEOUS | Status: DC
  Administered 2018-12-31: 5 [IU] via SUBCUTANEOUS
  Filled 2018-12-31 (×2): qty 0.05

## 2018-12-31 MED ORDER — VITAMIN C 500 MG PO TABS
500.0000 mg | ORAL_TABLET | Freq: Every day | ORAL | Status: DC
  Administered 2018-12-31 – 2019-01-05 (×6): 500 mg
  Filled 2018-12-31 (×6): qty 1

## 2018-12-31 MED ORDER — ESCITALOPRAM OXALATE 10 MG PO TABS
10.0000 mg | ORAL_TABLET | Freq: Every day | ORAL | Status: DC
  Administered 2018-12-31 – 2019-01-16 (×17): 10 mg
  Filled 2018-12-31 (×18): qty 1

## 2018-12-31 MED ORDER — CLONAZEPAM 1 MG PO TABS
1.0000 mg | ORAL_TABLET | Freq: Two times a day (BID) | ORAL | Status: DC
  Administered 2018-12-31 – 2019-01-17 (×35): 1 mg
  Filled 2018-12-31 (×35): qty 1

## 2018-12-31 MED ORDER — POLYETHYLENE GLYCOL 3350 17 G PO PACK
17.0000 g | PACK | Freq: Every day | ORAL | Status: DC | PRN
  Administered 2019-01-01: 17 g
  Filled 2018-12-31: qty 1

## 2018-12-31 MED ORDER — CALCIUM GLUCONATE-NACL 1-0.675 GM/50ML-% IV SOLN
1.0000 g | Freq: Once | INTRAVENOUS | Status: AC
  Administered 2018-12-31: 1000 mg via INTRAVENOUS
  Filled 2018-12-31: qty 50

## 2018-12-31 MED ORDER — FREE WATER
100.0000 mL | Freq: Three times a day (TID) | Status: DC
  Administered 2018-12-31 – 2019-01-01 (×3): 100 mL

## 2018-12-31 NOTE — Progress Notes (Signed)
OT Cancellation Note  Patient Details Name: Dashae Perini MRN: [id number] DOB: [birth year]   Cancelled Treatment:    Reason Eval/Treat Not Completed: Patient not medically ready(intubated;sedated; 100% FiO2; Peep 16)  Keiffer Piper,HILLARY 12/31/2018, 8:19 AM  Maurie Boettcher, OT/L   Acute OT Clinical Specialist Acute Rehabilitation Services Pager [phone] Office [phone]

## 2018-12-31 NOTE — Progress Notes (Signed)
Spoke with and updated patients husband Shanon Brow. He would like to set up a video chat for tonight around 2000 with himself and his two daughters. Will let night shift RN know.

## 2018-12-31 NOTE — Procedures (Signed)
Arterial Catheter Insertion Procedure Note Kim Harrison [id number] Sep 06, 1955  Procedure: Insertion of Arterial Catheter  Indications: Blood pressure monitoring and Frequent blood sampling  Procedure Details Consent: Unable to obtain consent because of altered level of consciousness. Time Out: Verified patient identification, verified procedure, site/side was marked, verified correct patient position, special equipment/implants available, medications/allergies/relevent history reviewed, required imaging and test results available.  Performed  Maximum sterile technique was used including antiseptics, cap, gloves, gown, hand hygiene, mask and sheet. Skin prep: Chlorhexidine; local anesthetic administered 20 gauge catheter was inserted into right radial artery using the Seldinger technique. ULTRASOUND GUIDANCE USED: NO Evaluation Blood flow good; BP tracing good. Complications: No apparent complications.   Kim Harrison 12/31/2018

## 2018-12-31 NOTE — Progress Notes (Signed)
[facility] Progress Note Patient Name: Kim Harrison DOB: [birth year] MRN: [id number]   Date of Service  12/31/2018  HPI/Events of Note  ABG on 100%/PC 30/Rate 35/P 16 = 7.02/>97/69  eICU Interventions  Will order: 1. NaHCO3 200 meq IV now.  2. Increase NaHCO3 IV infusion to 150 mL/hour. 3. Repeat ABG at 5 AM.      Intervention Category Major Interventions: Acid-Base disturbance - evaluation and management;Respiratory failure - evaluation and management  Shaylyn Bawa Eugene 12/31/2018, 2:11 AM

## 2018-12-31 NOTE — Progress Notes (Signed)
Pt's head turned at this time w/o complications.

## 2018-12-31 NOTE — Progress Notes (Signed)
Placed patient in prone position. ETT secured 23cm @ lip with cloth tape. Arms placed in swimmers positon. Proper barrier pads placed on cheeks and forehead. Stable throughout process. RN x 3, RT x 2 at bedside.

## 2018-12-31 NOTE — Progress Notes (Signed)
Rt Note:  Critical ABG results given to RN and called to Elink  PH 7.02 Co2 >97 Po2 69

## 2018-12-31 NOTE — Progress Notes (Signed)
[place] [facility] Progress Note Patient Name: Kim Harrison DOB: [birth year] MRN: [id number]   Date of Service  12/31/2018  HPI/Events of Note  Hypotension - Norepinephrine IV infusion restarted. Given Episodes of AFIB would prefer Phenylephrine IV infusion.   eICU Interventions  Will order: 1. Phenylephrine IV infusion. Titrate to MAP >= 65.  2. Wean Norepinephrine IV infusion off as tolerated.      Intervention Category Major Interventions: Hypotension - evaluation and management  Balin Vandegrift Eugene 12/31/2018, 12:42 AM

## 2018-12-31 NOTE — Progress Notes (Addendum)
NAME:  Kim Harrison, MRN:  [id number], DOB:  1955/10/05, LOS: 21 ADMISSION DATE:  12/28/2018, CONSULTATION DATE: 11/30 REFERRING MD: Dr. Sherral Hammers, CHIEF COMPLAINT: Shortness of breath  Brief History   63 year old female admitted 11/9 with a 10-day history of shortness of breath.  Outpatient testing for Covid was initially negative, then positive on 10/30. As an outpatient she completed 2 rounds of antibiotics and steroids. She presented to [facility] on 11/9 after a syncopal episode and was found to be COVID-19 positive.  After transfer to [facility] the patient was treated with steroids, remdesivir, convalescent plasma and 2 doses of tocilizumab.  She required high flow oxygen.  Follow-up CTA imaging of the chest revealed bilateral groundglass opacities, septal thickening and no PE.  Course notable for worsening on 11/27 with hypotension and worsening of left lower lobe infiltrate.  Cultures were obtained and the patient was treated for suspected superimposed bacterial pneumonia.  On 11/30 she decompensated requiring intubation.   Past Medical History  Sarcoidosis Hypothyroidism Dyslipidemia Anxiety Cervical Cancer   Significant Hospital Events   11/09 Admit 11/14 Transfer to ICU  11/17 Transfer to PCU 11/27 Worsening of hypoxemic respiratory failure 11/29 Transfer back to ICU for worsening hypoxemia  11/30 Decompensation requiring intubation, paralytics 12/01 Prone positioning, acidosis overnight  Consults:  PCCM  Procedures:  ETT 11/30 >> L IJ TLC 11/30 >>   Significant Diagnostic Tests:   CTA Chest 11/9 >> negative for PE, interlobular septal thickening, bilateral groundglass opacities with lower lobe predominance  CTA Chest 11/14 >> negative for PE, moderate worsening of groundglass opacities and septal thickening since prior exam superimposed on changes of sarcoidosis  ECHO 11/28 >> LVEF 60 to 65%, LVH, grade 1 diastolic dysfunction, LA/RA normal size, trace MVR, mild  TVR, no evidence of left ear, mildly elevated pulmonary artery systolic pressure  Micro Data:  MRSA PCR 11/14 >>  BCx2 11/28 >>   Antimicrobials:  Remdesivir 11/9 >> 11/14  Convalescent plasma 11/11  Toclizumab 11/11 & 11/13  Aztreonam 11/28 >>   Interim history/subjective:  RN reports pt on neosynephrine, levophed gtt's.  Sedation + paralytics.  RT reports respiratory acidosis overnight, changed to PCV.    Objective   Blood pressure 98/67, pulse 93, temperature (!) 97.5 F (36.4 C), temperature source Axillary, resp. rate (!) 35, height 5\' 5"  (1.651 m), weight 68.4 kg, SpO2 (!) 84 %. CVP:  [5 mmHg-20 mmHg] 14 mmHg  Vent Mode: PCV FiO2 (%):  [100 %] 100 % Set Rate:  [28 bmp-35 bmp] 35 bmp Vt Set:  [340 mL] 340 mL PEEP:  [16 cmH20] 16 cmH20 Plateau Pressure:  [21 cmH20-45 cmH20] 45 cmH20   Intake/Output Summary (Last 24 hours) at 12/31/2018 0803 Last data filed at 12/31/2018 0400 Gross per 24 hour  Intake 1362.93 ml  Output 630 ml  Net 732.93 ml   Filed Weights   12/28/18 0614 12/30/18 0030 12/30/18 0412  Weight: 66.1 kg 68.4 kg 68.4 kg    Examination: General: critically ill appearing female lying in bed in prone position on vent HEENT: MM pink/moist, ETT, unable to assess for JVD due to positioning  Neuro: sedate, paralyzed  CV: s1s2 rrr, no m/r/g PULM:  Even/non-labored on vent, synchronous, distant breath sounds but clear  GI: unable to assess, in prone position  Extremities: warm/dry, 1-2= generalized edema  Skin: no rashes or lesions  Resolved Hospital Problem list      Assessment & Plan:   Severe ARDS secondary to COVID PNA  Acute Hypoxemic / Hypercarbic Respiratory Failure  Sarcoidosis P/F Ratio 63, PIP 46, Plateau 35 -continue low Vt ventilation, goal 4-8cc/kg -change to PRVC ventilation  -goal Plateau Pressure <30, Driving Pressure < [id number] H20 -wean PEEP / FiO2 per ARDS protocol, goal PaO2 55-65 -continue paralytics for ventilator synchrony  -prone  ventilation for 16 hours per day with P/F ratio < 150 -RASS Goal: -4 to -5  -stop bicarb gtt  -place aline for frequent ABG sampling  Shock  Baseline SBP ~90-100, not on antihypertensives.  Suspect sedation related.  -continue stress dose steroids  -levophed for MAP > 65  -wean neo off  -add vasopressin if levophed > 32mcg/hr  -follow CVP, goal <4.   -empiric aztreonam for now, consider stopping pending PCT review 12/2  Hypernatremia -add free water 100 ml Q8 -Trend Na  Hypocalcemia -replace Ca+, ionized 0.94  Hyperglycemia  -change SSI to Q4, moderate scale -add lantus 5 units QD -add 3 units TF coverage  -goal CBG 140-180 while in ICU  Anemia  Thrombocytopenia  -Trend CBC  -transfuse for Hgb <7%, active bleeding or MI <8%  Anxiety -continue lexapro, klonopin   At Risk Malnutrition  -TF per Nutrition    Best practice:  Diet: TF Pain/Anxiety/Delirium protocol (if indicated): in place  VAP protocol (if indicated): in place  DVT prophylaxis: fondaparinux GI prophylaxis: pepcid  Glucose control: SSI Mobility: BR  Code Status: Full Code  Family Communication: Husband Shanon Brow [phone]) updated via phone 12/1. Burns Spain (daughter [phone]).  Wetzel Bjornstad (daughter [phone].  Husband works as a Engineer, drilling and at times has limited cell signal.  Please call Wells Guiles first if he can not be reached.  Disposition: ICU   Labs   CBC: Recent Labs  Lab 12/28/18 1200 12/29/18 0201 12/29/18 2342 12/30/18 0610 12/31/18 0515 12/31/18 0519  WBC 12.0* 12.6*  --  11.9* 18.8*  --   NEUTROABS 10.2* 12.0*  --   --  17.6*  --   HGB 13.6 11.4* 11.6* 11.8* 11.1* 11.9*  HCT 42.3 34.7* 34.0* 35.1* 36.2 35.0*  MCV 95.9 93.8  --  93.9 101.1*  --   PLT 125* 118*  --  99* 103*  --     Basic Metabolic Panel: Recent Labs  Lab 12/28/18 1200 12/29/18 0201 12/29/18 2342 12/30/18 0610 12/30/18 1740 12/31/18 0020 12/31/18 0515 12/31/18 0519  NA 136 139 138 141  --   149* 152* 148*  K 3.7 4.2 3.4* 3.1*  --  4.5 4.4 4.2  CL 102 106  --  103  --  99 97*  --   CO2 27 26  --  29  --  40* 41*  --   GLUCOSE 117* 108*  --  130*  --  323* 231*  --   BUN 23 24*  --  26*  --  33* 31*  --   CREATININE 0.79 0.66  --  0.76  --  1.15* 1.13*  --   CALCIUM 7.8* 8.1*  --  7.9*  --  7.4* 7.0*  --   MG  --   --   --  1.9 2.3  --  2.0  --   PHOS  --   --   --   --  5.5*  --  8.1*  --    GFR: Estimated Creatinine Clearance: 48.9 mL/min (A) (by C-G formula based on SCr of 1.13 mg/dL (H)). Recent Labs  Lab 12/28/18 1200 12/29/18 0201 12/30/18 [id number] 12/30/18 0617 12/31/18 0515  PROCALCITON  --   --   --  0.24 0.35  WBC 12.0* 12.6* 11.9*  --  18.8*    Liver Function Tests: Recent Labs  Lab 12/25/18 0135 12/28/18 1200 12/29/18 0201 12/30/18 0610 12/31/18 0515  AST 36 35 29 24 110*  ALT 97* 57* 47* 40 187*  ALKPHOS 47 63 64 86 108  BILITOT 0.7 0.9 0.6 0.4 1.5*  PROT 4.4* 4.8* 4.4* 4.5* 4.6*  ALBUMIN 2.4* 2.5* 2.2* 2.3* 2.9*   No results for input(s): LIPASE, AMYLASE in the last 168 hours. No results for input(s): AMMONIA in the last 168 hours.  ABG    Component Value Date/Time   PHART 7.329 (L) 12/31/2018 0519   PCO2ART 86.8 (HH) 12/31/2018 0519   PO2ART 44.0 (L) 12/31/2018 0519   HCO3 45.8 (H) 12/31/2018 0519   TCO2 48 (H) 12/31/2018 0519   O2SAT 74.0 12/31/2018 0519     Coagulation Profile: No results for input(s): INR, PROTIME in the last 168 hours.  Cardiac Enzymes: No results for input(s): CKTOTAL, CKMB, CKMBINDEX, TROPONINI in the last 168 hours.  HbA1C: Hgb A1c MFr Bld  Date/Time Value Ref Range Status  12/15/2018 06:27 AM 6.2 (H) 4.8 - 5.6 % Final    Comment:    (NOTE) Pre diabetes:          5.7%-6.4% Diabetes:              >6.4% Glycemic control for   <7.0% adults with diabetes     CBG: Recent Labs  Lab 12/30/18 1310 12/30/18 2008 12/30/18 2254 12/31/18 0329 12/31/18 0636  GLUCAP 145* 227* 243* 241* 220*      Critical care time: 75 minutes      Noe Gens, MSN, NP-C [facility] 12/31/2018, 8:03 AM   Please see [email or web address] for pager details.

## 2018-12-31 NOTE — Progress Notes (Signed)
PT Cancellation Note  Patient Details Name: Kim Harrison MRN: [id number] DOB: Nov 28, 1955   Cancelled Treatment:    Reason Eval/Treat Not Completed: Medical issues which prohibited therapy, now on vent with medical issues. Will check back another day.   Marc, Leidig 12/31/2018, 7:02 AM  [place]  Office [phone]

## 2018-12-31 NOTE — Progress Notes (Signed)
Patient placed in supine position by RT x 2 and RN x 3 without complications.  ETT re-secured with a commercial tube holder at 23.

## 2018-12-31 NOTE — Progress Notes (Signed)
Head repositioned. Arms rotated. ETT secured.

## 2018-12-31 NOTE — Progress Notes (Signed)
Physical Therapy Discharge Patient Details Name: Kim Harrison MRN: [id number] DOB: March 27, 1955 Today's Date: 12/31/2018 Time:  -     Patient discharged from PT services secondary to goals met and no further PT needs identified and medical decline - will need to re-order PT to resume therapy services.per  Critical care  Please see latest therapy progress note for current level of functioning and progress toward goals.      GP     Rowan, Pollman 12/31/2018, 1:43 PM    Tresa Endo PT Acute Rehabilitation Services Pager [phone] Office [phone]

## 2018-12-31 NOTE — Progress Notes (Signed)
ABG results given to Dr. Lake Bells and B. Alfredo Martinez, NP.

## 2018-12-31 NOTE — Progress Notes (Signed)
Head repositioned. Arms rotated. ETT remains sucured.

## 2018-12-31 NOTE — Progress Notes (Signed)
Pharmacy Antibiotic Note  Kim Harrison is a 63 y.o. female admitted on 12/30/2018 with a diagnosis of Covid-19 induced pneumonia.  Pharmacy has been consulted for Aztreonam dosing. SCr increased with CrCl ~ 48 mlmin.  WBC inc to 18 (steroids increased to HC 100 q8h on 11/29).  Afebrile.  PCT 0.35  Plan: Aztreonam 2g IV Q 8 hours. Follow up renal function, culture results, and clinical course. Follow up PCT tomorrow.   Height: 5\' 5"  (165.1 cm) Weight: 150 lb 11.2 oz (68.4 kg) IBW/kg (Calculated) : 57  Temp (24hrs), Avg:98 F (36.7 C), Min:96.6 F (35.9 C), Max:98.9 F (37.2 C)  Recent Labs  Lab 12/28/18 1200 12/29/18 0201 12/30/18 0610 12/31/18 0020 12/31/18 0515  WBC 12.0* 12.6* 11.9*  --  18.8*  CREATININE 0.79 0.66 0.76 1.15* 1.13*    Estimated Creatinine Clearance: 48.9 mL/min (A) (by C-G formula based on SCr of 1.13 mg/dL (H)).    Allergies  Allergen Reactions  . 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  . Cephalexin Anaphylaxis  . Penicillins Anaphylaxis    Did it involve swelling of the face/tongue/throat, SOB, or low BP? Yes Did it involve sudden or severe rash/hives, skin peeling, or any reaction on the inside of your mouth or nose? No Did you need to seek medical attention at a hospital or doctor's office? Yes When did it last happen?couple of years ago If all above answers are "NO", may proceed with cephalosporin use.   Marland Kitchen Beef-Derived Products   . Pork-Derived Products   . Sulfonamide Derivatives Hives    Antimicrobials this admission: Aztreonam 11/28>>  Dose adjustments this admission:   Microbiology results: 11/14 MRSA PCR: Neg 11/28 BCx: ngtd  Thank you for allowing pharmacy to be a part of this patient's care.  Gretta Arab PharmD, BCPS Clinical pharmacist phone 7am- 5pm: [phone] 12/31/2018 3:18 PM

## 2018-12-31 NOTE — Progress Notes (Addendum)
Nutrition Follow-up / Consult  DOCUMENTATION CODES:   Not applicable  INTERVENTION:    Vital 1.5 at 40 ml/h (960 ml per day)   Pro-stat 30 ml TID   Provides 1740 kcal, 110 gm protein, 733 ml free water daily  NUTRITION DIAGNOSIS:   Increased nutrient needs related to acute illness(COVID 19 PNA) as evidenced by estimated needs.  Ongoing   GOAL:   Patient will meet greater than or equal to 90% of their needs  Progressing  MONITOR:   Vent status, Labs, TF tolerance, I & O's  REASON FOR ASSESSMENT:   Ventilator, Consult Enteral/tube feeding initiation and management  ASSESSMENT:   63 yo female admitted with fever, chills, COVID 19 PNA (tested positive on 11/6). PMH includes sarcoidosis, cervical cancer, hypothyroidism, HLD.  Patient with severe ARDS related to COVID PNA; she decompensated and required intubation on 11/30.  Received MD Consult for TF initiation and management. OG tube in place.  Patient is being proned.   Patient is currently intubated on ventilator support MV: 10.1 L/min Temp (24hrs), Avg:98 F (36.7 C), Min:96.6 F (35.9 C), Max:98.9 F (37.2 C)   Labs reviewed. Sodium 148 (H) CBG's: 241-220-208-166  Medications reviewed and include vitamin D3, colace, solu-cortef, novolog, lantus, vitamin C, zinc, levophed, neosynephrine.  Admission weight 72.6 kg Current weight 68.4 kg  Diet Order:   Diet Order    None      EDUCATION NEEDS:   Not appropriate for education at this time  Skin:  Skin Assessment: Reviewed RN Assessment  Last BM:  11/29  Height:   Ht Readings from Last 1 Encounters:  12/30/18 5\' 5"  (1.651 m)    Weight:   Wt Readings from Last 1 Encounters:  12/30/18 68.4 kg    Ideal Body Weight:  56.8 kg  BMI:  Body mass index is 25.08 kg/m.  Estimated Nutritional Needs:   Kcal:  1575-2000  Protein:  100-115 gm  Fluid:  >/= 1.8 L    Molli Barrows, RD, LDN, [place] Pager [phone] After Hours Pager  [phone]

## 2018-12-31 DEATH — deceased

## 2019-01-01 ENCOUNTER — Other Ambulatory Visit: Payer: Self-pay

## 2019-01-01 ENCOUNTER — Inpatient Hospital Stay (HOSPITAL_COMMUNITY): Payer: BC Managed Care – PPO

## 2019-01-01 DIAGNOSIS — J9601 Acute respiratory failure with hypoxia: Secondary | ICD-10-CM | POA: Diagnosis not present

## 2019-01-01 DIAGNOSIS — U071 COVID-19: Secondary | ICD-10-CM | POA: Diagnosis not present

## 2019-01-01 DIAGNOSIS — J069 Acute upper respiratory infection, unspecified: Secondary | ICD-10-CM

## 2019-01-01 DIAGNOSIS — I9589 Other hypotension: Secondary | ICD-10-CM | POA: Diagnosis not present

## 2019-01-01 LAB — POCT I-STAT 7, (LYTES, BLD GAS, ICA,H+H)
Acid-Base Excess: 21 mmol/L — ABNORMAL HIGH (ref 0.0–2.0)
Acid-Base Excess: 22 mmol/L — ABNORMAL HIGH (ref 0.0–2.0)
Bicarbonate: 50.5 mmol/L — ABNORMAL HIGH (ref 20.0–28.0)
Bicarbonate: 51 mmol/L — ABNORMAL HIGH (ref 20.0–28.0)
Calcium, Ion: 1.13 mmol/L — ABNORMAL LOW (ref 1.15–1.40)
Calcium, Ion: 1.11 mmol/L — ABNORMAL LOW (ref 1.15–1.40)
HCT: 31 % — ABNORMAL LOW (ref 36.0–46.0)
HCT: 31 % — ABNORMAL LOW (ref 36.0–46.0)
Hemoglobin: 10.5 g/dL — ABNORMAL LOW (ref 12.0–15.0)
Hemoglobin: 10.5 g/dL — ABNORMAL LOW (ref 12.0–15.0)
O2 Saturation: 90 %
O2 Saturation: 97 %
Patient temperature: 99.7
Patient temperature: 98.9
Potassium: 3.5 mmol/L (ref 3.5–5.1)
Potassium: 3.6 mmol/L (ref 3.5–5.1)
Sodium: 148 mmol/L — ABNORMAL HIGH (ref 135–145)
Sodium: 149 mmol/L — ABNORMAL HIGH (ref 135–145)
TCO2: >50 mmol/L — ABNORMAL HIGH (ref 22–32)
TCO2: >50 mmol/L — ABNORMAL HIGH (ref 22–32)
pCO2 arterial: >97 mmHg (ref 32.0–48.0)
pCO2 arterial: 85.3 mmHg (ref 32.0–48.0)
pH, Arterial: 7.324 — ABNORMAL LOW (ref 7.350–7.450)
pH, Arterial: 7.386 (ref 7.350–7.450)
pO2, Arterial: 71 mmHg — ABNORMAL LOW (ref 83.0–108.0)
pO2, Arterial: 104 mmHg (ref 83.0–108.0)

## 2019-01-01 LAB — CBC WITH DIFFERENTIAL/PLATELET
Abs Immature Granulocytes: 0.07 10*3/uL (ref 0.00–0.07)
Basophils Absolute: 0 10*3/uL (ref 0.0–0.1)
Basophils Relative: 0 %
Eosinophils Absolute: 0 10*3/uL (ref 0.0–0.5)
Eosinophils Relative: 0 %
HCT: 34.9 % — ABNORMAL LOW (ref 36.0–46.0)
Hemoglobin: 10.3 g/dL — ABNORMAL LOW (ref 12.0–15.0)
Immature Granulocytes: 1 %
Lymphocytes Relative: 3 %
Lymphs Abs: 0.3 10*3/uL — ABNORMAL LOW (ref 0.7–4.0)
MCH: 31.1 pg (ref 26.0–34.0)
MCHC: 29.5 g/dL — ABNORMAL LOW (ref 30.0–36.0)
MCV: 105.4 fL — ABNORMAL HIGH (ref 80.0–100.0)
Monocytes Absolute: 0.4 10*3/uL (ref 0.1–1.0)
Monocytes Relative: 4 %
Neutro Abs: 9.2 10*3/uL — ABNORMAL HIGH (ref 1.7–7.7)
Neutrophils Relative %: 92 %
Platelets: 84 10*3/uL — ABNORMAL LOW (ref 150–400)
RBC: 3.31 MIL/uL — ABNORMAL LOW (ref 3.87–5.11)
RDW: 15.7 % — ABNORMAL HIGH (ref 11.5–15.5)
WBC: 9.9 10*3/uL (ref 4.0–10.5)
nRBC: 0 % (ref 0.0–0.2)

## 2019-01-01 LAB — COMPREHENSIVE METABOLIC PANEL
ALT: 214 U/L — ABNORMAL HIGH (ref 0–44)
ALT: 297 U/L — ABNORMAL HIGH (ref 0–44)
AST: 104 U/L — ABNORMAL HIGH (ref 15–41)
AST: 141 U/L — ABNORMAL HIGH (ref 15–41)
Albumin: 2.7 g/dL — ABNORMAL LOW (ref 3.5–5.0)
Albumin: 2.6 g/dL — ABNORMAL LOW (ref 3.5–5.0)
Alkaline Phosphatase: 91 U/L (ref 38–126)
Alkaline Phosphatase: 100 U/L (ref 38–126)
Anion gap: 9 (ref 5–15)
Anion gap: 9 (ref 5–15)
BUN: 33 mg/dL — ABNORMAL HIGH (ref 8–23)
BUN: 36 mg/dL — ABNORMAL HIGH (ref 8–23)
CO2: 46 mmol/L — ABNORMAL HIGH (ref 22–32)
CO2: 44 mmol/L — ABNORMAL HIGH (ref 22–32)
Calcium: 7.9 mg/dL — ABNORMAL LOW (ref 8.9–10.3)
Calcium: 8 mg/dL — ABNORMAL LOW (ref 8.9–10.3)
Chloride: 97 mmol/L — ABNORMAL LOW (ref 98–111)
Chloride: 97 mmol/L — ABNORMAL LOW (ref 98–111)
Creatinine, Ser: 0.88 mg/dL (ref 0.44–1.00)
Creatinine, Ser: 0.69 mg/dL (ref 0.44–1.00)
GFR calc Af Amer: >60 mL/min (ref >60)
GFR calc Af Amer: >60 mL/min (ref >60)
GFR calc non Af Amer: >60 mL/min (ref >60)
GFR calc non Af Amer: >60 mL/min (ref >60)
Glucose, Bld: 133 mg/dL — ABNORMAL HIGH (ref 70–99)
Glucose, Bld: 175 mg/dL — ABNORMAL HIGH (ref 70–99)
Potassium: 3.4 mmol/L — ABNORMAL LOW (ref 3.5–5.1)
Potassium: 3.7 mmol/L (ref 3.5–5.1)
Sodium: 152 mmol/L — ABNORMAL HIGH (ref 135–145)
Sodium: 150 mmol/L — ABNORMAL HIGH (ref 135–145)
Total Bilirubin: 0.6 mg/dL (ref 0.3–1.2)
Total Bilirubin: 0.5 mg/dL (ref 0.3–1.2)
Total Protein: 4.6 g/dL — ABNORMAL LOW (ref 6.5–8.1)
Total Protein: 4.7 g/dL — ABNORMAL LOW (ref 6.5–8.1)

## 2019-01-01 LAB — C-REACTIVE PROTEIN: CRP: 0.8 mg/dL (ref <1.0)

## 2019-01-01 LAB — PROCALCITONIN: Procalcitonin: 0.52 ng/mL

## 2019-01-01 LAB — FERRITIN: Ferritin: 108 ng/mL (ref 11–307)

## 2019-01-01 LAB — GLUCOSE, CAPILLARY
Glucose-Capillary: 152 mg/dL — ABNORMAL HIGH (ref 70–99)
Glucose-Capillary: 133 mg/dL — ABNORMAL HIGH (ref 70–99)
Glucose-Capillary: 68 mg/dL — ABNORMAL LOW (ref 70–99)
Glucose-Capillary: 104 mg/dL — ABNORMAL HIGH (ref 70–99)
Glucose-Capillary: 105 mg/dL — ABNORMAL HIGH (ref 70–99)
Glucose-Capillary: 143 mg/dL — ABNORMAL HIGH (ref 70–99)
Glucose-Capillary: 151 mg/dL — ABNORMAL HIGH (ref 70–99)

## 2019-01-01 LAB — PHOSPHORUS: Phosphorus: 3.6 mg/dL (ref 2.5–4.6)

## 2019-01-01 LAB — MAGNESIUM: Magnesium: 2.2 mg/dL (ref 1.7–2.4)

## 2019-01-01 LAB — D-DIMER, QUANTITATIVE: D-Dimer, Quant: 3.47 ug/mL-FEU — ABNORMAL HIGH (ref 0.00–0.50)

## 2019-01-01 LAB — TROPONIN I (HIGH SENSITIVITY)
Troponin I (High Sensitivity): 1934 ng/L (ref <18)
Troponin I (High Sensitivity): 1814 ng/L (ref <18)

## 2019-01-01 LAB — LACTATE DEHYDROGENASE: LDH: 448 U/L — ABNORMAL HIGH (ref 98–192)

## 2019-01-01 MED ORDER — ASPIRIN 81 MG PO CHEW
81.0000 mg | CHEWABLE_TABLET | Freq: Every day | ORAL | Status: DC
  Administered 2019-01-01 – 2019-01-16 (×16): 81 mg
  Filled 2019-01-01 (×16): qty 1

## 2019-01-01 MED ORDER — ASPIRIN 81 MG PO CHEW: 81.0000 mg | CHEWABLE_TABLET | Freq: Every day | ORAL | Status: DC

## 2019-01-01 MED ORDER — POTASSIUM CHLORIDE 20 MEQ/15ML (10%) PO SOLN
40.0000 meq | Freq: Once | ORAL | Status: AC
  Administered 2019-01-01: 40 meq via ORAL
  Filled 2019-01-01: qty 30

## 2019-01-01 MED ORDER — FUROSEMIDE 10 MG/ML IJ SOLN
20.0000 mg | Freq: Once | INTRAMUSCULAR | Status: AC
  Administered 2019-01-01: 20 mg via INTRAVENOUS
  Filled 2019-01-01: qty 2

## 2019-01-01 MED ORDER — FREE WATER
200.0000 mL | Freq: Four times a day (QID) | Status: DC
  Administered 2019-01-01 – 2019-01-02 (×4): 200 mL

## 2019-01-01 MED ORDER — DEXTROSE 50 % IV SOLN
25.0000 mL | Freq: Once | INTRAVENOUS | Status: AC
  Administered 2019-01-01: 09:00:00 12.5 g via INTRAVENOUS

## 2019-01-01 MED ORDER — DEXTROSE 50 % IV SOLN
INTRAVENOUS | Status: AC
  Administered 2019-01-01: 09:00:00 12.5 g via INTRAVENOUS
  Filled 2019-01-01: qty 50

## 2019-01-01 NOTE — Procedures (Signed)
Cortrak  Person Inserting Tube:  Cherylee Rawlinson C, RD Tube Type:  Cortrak - 43 inches Tube Location:  Left nare Initial Placement:  Stomach Secured by: Bridle Technique Used to Measure Tube Placement:  Documented cm marking at nare/ corner of mouth Cortrak Secured At:  70 cm    Cortrak Tube Team Note:  Consult received to place a Cortrak feeding tube.   No x-ray is required. RN may begin using tube.   If the tube becomes dislodged please keep the tube and contact the Cortrak team at [email or web address] (password [email or web address]) for replacement.  If after hours and replacement cannot be delayed, place a NG tube and confirm placement with an abdominal x-ray.    Riely Baskett RD, LDN, CNSC [phone] Pager [phone] After Hours Pager   

## 2019-01-01 NOTE — Progress Notes (Addendum)
NAME:  Kim Harrison, MRN:  [id number], DOB:  1955-06-16, LOS: 25 ADMISSION DATE:  12/23/2018, CONSULTATION DATE: 11/30 REFERRING MD: Dr. Sherral Hammers, CHIEF COMPLAINT: Shortness of breath  Brief History   63 year old female admitted 11/9 with a 10-day history of shortness of breath.  Outpatient testing for Covid was initially negative, then positive on 10/30. As an outpatient she completed 2 rounds of antibiotics and steroids. She presented to [facility] on 11/9 after a syncopal episode and was found to be COVID-19 positive.  After transfer to [facility] the patient was treated with steroids, remdesivir, convalescent plasma and 2 doses of tocilizumab.  She required high flow oxygen.  Follow-up CTA imaging of the chest revealed bilateral groundglass opacities, septal thickening and no PE.  Course notable for worsening on 11/27 with hypotension and worsening of left lower lobe infiltrate.  Cultures were obtained and the patient was treated for suspected superimposed bacterial pneumonia.  On 11/30 she decompensated requiring intubation.   Past Medical History  Sarcoidosis Hypothyroidism Dyslipidemia Anxiety Cervical Cancer   Significant Hospital Events   11/09 Admit 11/14 Transfer to ICU  11/17 Transfer to PCU 11/27 Worsening of hypoxemic respiratory failure 11/29 Transfer back to ICU for worsening hypoxemia  11/30 Decompensation requiring intubation, paralytics 12/01 Prone positioning, acidosis overnight  Consults:  PCCM  Procedures:  ETT 11/30 >> L IJ TLC 11/30 >>  R Rad Aline 12/1 >>   Significant Diagnostic Tests:   CTA Chest 11/9 >> negative for PE, interlobular septal thickening, bilateral groundglass opacities with lower lobe predominance  CTA Chest 11/14 >> negative for PE, moderate worsening of groundglass opacities and septal thickening since prior exam superimposed on changes of sarcoidosis  ECHO 11/28 >> LVEF 60 to 65%, LVH, grade 1 diastolic dysfunction, LA/RA normal  size, trace MVR, mild TVR, no evidence of left ear, mildly elevated pulmonary artery systolic pressure  Micro Data:  MRSA PCR 11/14 >>  BCx2 11/28 >>   Antimicrobials:  Remdesivir 11/9 >> 11/14  Convalescent plasma 11/11  Toclizumab 11/11 & 11/13  Aztreonam 11/28 >>   Interim history/subjective:  Tmax 99.7. I/O - 1L UOP in last 24h, 2.4L+ in 24h. Prone positioned at 2100. RT reports elevated pressures on vent when head turned to the left compared to right. RN reports vasopressors weaned off. Hypoglycemia episode am 12/2 to 68   Objective   Blood pressure 101/65, pulse 76, temperature 99.7 F (37.6 C), temperature source Oral, resp. rate (!) 35, height 5\' 5"  (1.651 m), weight 78.7 kg, SpO2 95 %. CVP:  [12 mmHg-14 mmHg] 13 mmHg  Vent Mode: PRVC FiO2 (%):  [100 %] 100 % Set Rate:  [30 bmp-35 bmp] 35 bmp Vt Set:  [340 mL] 340 mL PEEP:  [16 cmH20] 16 cmH20 Plateau Pressure:  [21 cmH20-39 cmH20] 21 cmH20   Intake/Output Summary (Last 24 hours) at 01/01/2019 0819 Last data filed at 01/01/2019 0600 Gross per 24 hour  Intake 3007.37 ml  Output 1020 ml  Net 1987.37 ml   Filed Weights   12/30/18 0030 12/30/18 0412 01/01/19 0444  Weight: 68.4 kg 68.4 kg 78.7 kg    Examination: General: critically ill appearing adult female lying in bed in NAD HEENT: MM pink/moist, ETT, facial edema Neuro: sedate/paralyzed on vent CV: s1s2 rrr, no m/r/g, SR on monitor  PULM:  Non-labored on vent, lungs bilaterally coarse / distant, no wheeze  GI: soft, bsx4 active  Extremities: warm/dry, trace to 1+ generalized edema  Skin: no rashes or lesions  Resolved  Hospital Problem list      Assessment & Plan:   Severe ARDS secondary to COVID PNA  Acute Hypoxemic / Hypercarbic Respiratory Failure  Sarcoidosis P/F Ratio 71, PIP 39, Plateau 38, driving pressure 22 -low Vt ventilation 4-8cc/kg -goal plateau pressure <30, driving pressure [id number] cm H2O -target PaO2 55-65, titrate PEEP/FiO2 per ARDS protocol   -if P/F ratio <150, consider prone therapy for 16 hours per day -allow hypercapnia to achieve pressure goals as above -goal CVP <4, diuresis as necessary -VAP prevention measures  -follow intermittent CXR, ABG -lasix 20 mg IV x1 (lasix naive)   Need for Sedation due to Mechanical Ventilation  -RASS Goal -4 to -5 with NMB -continue NMB for ventilator synchrony  -PAD protocol  -assess peak / plateau pressures pre/post paralytics 12/2  Shock  Baseline SBP ~90-100, not on antihypertensives.  Suspect component of sedation.  -continue stress dose steroids  -levophed, vasopressin as needed for MAP >65 -discontinue neo from [facility] -continue aztreonam   -trend PCT -follow blood cultures   Elevated LFT's  Suspect element of shock liver -follow LFT's   Hypernatremia -increase free water to 274ml Q6  -follow Na trend  Hypocalcemia -corrected for albumin, 8.9  -follow, replace as indicated   Hyperglycemia  Episode of hypoglycemia am 12/2 to 68 -SSI Q4, moderate scale  -discontinue lantus, TF coverage 12/2 -goal CBG 140-180  Anemia  Thrombocytopenia  -Trend CBC  -transfuse for Hgb <7%, active bleeding or MI <8%  Anxiety -continue klonopin, lexapro   At Risk Malnutrition  -TF per Nutrition    Best practice:  Diet: TF Pain/Anxiety/Delirium protocol (if indicated): in place  VAP protocol (if indicated): in place  DVT prophylaxis: fondaparinux GI prophylaxis: pepcid  Glucose control: SSI Mobility: BR  Code Status: Full Code  Family Communication: Husband Shanon Brow [phone]) updated via phone 12/2. Burns Spain (daughter [phone]).  Wetzel Bjornstad (daughter [phone]).  Husband works as a Engineer, drilling and at times has limited cell signal.  Please call Wells Guiles first if he can not be reached.  Disposition: ICU   Labs   CBC: Recent Labs  Lab 12/28/18 1200 12/29/18 0201  12/30/18 0610  12/31/18 0515 12/31/18 0519 12/31/18 1016 12/31/18 1416 01/01/19 0434  01/01/19 0500  WBC 12.0* 12.6*  --  11.9*  --  18.8*  --   --   --   --  9.9  NEUTROABS 10.2* 12.0*  --   --   --  17.6*  --   --   --   --  9.2*  HGB 13.6 11.4*   < > 11.8*   < > 11.1* 11.9* 11.2* 10.9* 10.5* 10.3*  HCT 42.3 34.7*   < > 35.1*   < > 36.2 35.0* 33.0* 32.0* 31.0* 34.9*  MCV 95.9 93.8  --  93.9  --  101.1*  --   --   --   --  105.4*  PLT 125* 118*  --  99*  --  103*  --   --   --   --  84*   < > = values in this interval not displayed.    Basic Metabolic Panel: Recent Labs  Lab 12/29/18 0201  12/30/18 0610 12/30/18 1740  12/31/18 0020  12/31/18 0515 12/31/18 0519 12/31/18 1016 12/31/18 1416 12/31/18 1915 01/01/19 0434 01/01/19 0500  NA 139   < > 141  --    < > 149*   < > 152* 148* 148* 148*  --  148* 152*  K  4.2   < > 3.1*  --    < > 4.5   < > 4.4 4.2 3.6 3.4*  --  3.5 3.4*  CL 106  --  103  --   --  99  --  97*  --   --   --   --   --  97*  CO2 26  --  29  --   --  40*  --  41*  --   --   --   --   --  46*  GLUCOSE 108*  --  130*  --   --  323*  --  231*  --   --   --   --   --  133*  BUN 24*  --  26*  --   --  33*  --  31*  --   --   --   --   --  33*  CREATININE 0.66  --  0.76  --   --  1.15*  --  1.13*  --   --   --   --   --  0.88  CALCIUM 8.1*  --  7.9*  --   --  7.4*  --  7.0*  --   --   --   --   --  7.9*  MG  --   --  1.9 2.3  --   --   --  2.0  --   --   --  2.0  --  2.2  PHOS  --   --   --  5.5*  --   --   --  8.1*  --   --   --  3.1  --  3.6   < > = values in this interval not displayed.   GFR: Estimated Creatinine Clearance: 67 mL/min (by C-G formula based on SCr of 0.88 mg/dL). Recent Labs  Lab 12/29/18 0201 12/30/18 0610 12/30/18 0617 12/31/18 0515 01/01/19 0500  PROCALCITON  --   --  0.24 0.35 0.52  WBC 12.6* 11.9*  --  18.8* 9.9    Liver Function Tests: Recent Labs  Lab 12/28/18 1200 12/29/18 0201 12/30/18 0610 12/31/18 0515 01/01/19 0500  AST 35 29 24 110* 104*  ALT 57* 47* 40 187* 214*  ALKPHOS 63 64 86 108 91  BILITOT 0.9  0.6 0.4 1.5* 0.6  PROT 4.8* 4.4* 4.5* 4.6* 4.6*  ALBUMIN 2.5* 2.2* 2.3* 2.9* 2.7*   No results for input(s): LIPASE, AMYLASE in the last 168 hours. No results for input(s): AMMONIA in the last 168 hours.  ABG    Component Value Date/Time   PHART 7.324 (L) 01/01/2019 0434   PCO2ART >97.0 (HH) 01/01/2019 0434   PO2ART 71.0 (L) 01/01/2019 0434   HCO3 50.5 (H) 01/01/2019 0434   TCO2 >50 (H) 01/01/2019 0434   O2SAT 90.0 01/01/2019 0434     Coagulation Profile: No results for input(s): INR, PROTIME in the last 168 hours.  Cardiac Enzymes: No results for input(s): CKTOTAL, CKMB, CKMBINDEX, TROPONINI in the last 168 hours.  HbA1C: Hgb A1c MFr Bld  Date/Time Value Ref Range Status  12/15/2018 06:27 AM 6.2 (H) 4.8 - 5.6 % Final    Comment:    (NOTE) Pre diabetes:          5.7%-6.4% Diabetes:              >6.4% Glycemic control for   <7.0% adults with diabetes  CBG: Recent Labs  Lab 12/31/18 1150 12/31/18 1602 12/31/18 2253 01/01/19 0119 01/01/19 0440  GLUCAP 166* 121* 146* 152* 133*     Critical care time: 35 minutes      Noe Gens, MSN, NP-C [facility] 01/01/2019, 8:19 AM   Please see [email or web address] for pager details.

## 2019-01-01 NOTE — Progress Notes (Addendum)
[facility] PCCM  Called emergently to bedside  Went into Atrial fib today This evening had a change in rhythm, hypotensive episode spontaneously 12 lead shows RBBB, likely RV strain pattern Bedside echo doesn't show septal bowing or overt RV failure Currently hemodynamically stable  Plan  Check electrolytes, troponin Repeat routine echo in AM No prone tonight given change in cardiac rhythm CXR now  Additional cc time 20 minutes  Roselie Awkward, MD [facility] [facility] Pager: [phone] Cell: [phone] If no response, call [phone]

## 2019-01-01 NOTE — Progress Notes (Addendum)
Hypoglycemic Event  CBG: 68  Treatment: D50 25 mL (12.5 gm)  Symptoms: None  Follow-up CBG: Time: 0940 CBG Result: 105  Possible Reasons for Event: Other: trickle feed, TF not to goal   Comments/MD notified:Override D50, standing order placed by this RN    Wilburn Cornelia

## 2019-01-01 NOTE — Progress Notes (Signed)
Assisted tele visit to patient with family member x 3.  Cartina Brousseau, Canary Brim, RN

## 2019-01-01 NOTE — Progress Notes (Signed)
Attempted call to Shanon Brow (husband). No answer. Left voicemail stating call purpose was to provide a POC update, non urgent. Reassured in voicemail that this RN would relay to PM RN to attempt Tyhee as previously done for family each night.

## 2019-01-01 NOTE — Progress Notes (Signed)
[facility] Progress Note Patient Name: Kim Harrison DOB: [birth year] MRN: [id number]   Date of Service  01/01/2019  HPI/Events of Note  Troponin = 1934. Creatinine = 0.88. Hgb = 10.5. EKG done earlier for rhythm change with evidence for AMI. Clinical picture c/w NSTEMI. Can't B-Block d/t Norepinephrine IV infusion. Patient already on Arixtra at dose indicated for NSTEMI per pharmacy.  eICU Interventions  Will order: 1. ASA 81 mg per tube now and Q day. 2. Continue to trend Troponin.     Intervention Category Intermediate Interventions: Diagnostic test evaluation  Sommer,Steven Cornelia Copa 01/01/2019, 8:01 PM

## 2019-01-01 NOTE — Progress Notes (Signed)
Patient appears to have returned to AFIB-- bedside 5 lead. Rate controlled <110 BPM. BP not affected at this time.   Change in rhythm s/p position change from prone to supine. HOB >30.   MD aware

## 2019-01-01 NOTE — Progress Notes (Signed)
Patient placed in supine position.  No breakdown noted to patient's face.  ETT secured with commercial tube holder.  Patient tolerated well.

## 2019-01-01 NOTE — Progress Notes (Signed)
Assisted pts husband and two daughters with a facetime video call via elink at 37. Shanon Brow, the husband requests to plan to facetime video call via elink everyday at the same time. Will let the day RN know.

## 2019-01-02 ENCOUNTER — Inpatient Hospital Stay (HOSPITAL_COMMUNITY): Payer: BC Managed Care – PPO

## 2019-01-02 DIAGNOSIS — F419 Anxiety disorder, unspecified: Secondary | ICD-10-CM | POA: Diagnosis not present

## 2019-01-02 DIAGNOSIS — I361 Nonrheumatic tricuspid (valve) insufficiency: Secondary | ICD-10-CM | POA: Diagnosis not present

## 2019-01-02 DIAGNOSIS — J9601 Acute respiratory failure with hypoxia: Secondary | ICD-10-CM | POA: Diagnosis not present

## 2019-01-02 DIAGNOSIS — I9589 Other hypotension: Secondary | ICD-10-CM | POA: Diagnosis not present

## 2019-01-02 DIAGNOSIS — U071 COVID-19: Secondary | ICD-10-CM | POA: Diagnosis not present

## 2019-01-02 LAB — CBC WITH DIFFERENTIAL/PLATELET
Abs Immature Granulocytes: 0.03 10*3/uL (ref 0.00–0.07)
Basophils Absolute: 0 10*3/uL (ref 0.0–0.1)
Basophils Relative: 0 %
Eosinophils Absolute: 0 10*3/uL (ref 0.0–0.5)
Eosinophils Relative: 0 %
HCT: 34.7 % — ABNORMAL LOW (ref 36.0–46.0)
Hemoglobin: 10.3 g/dL — ABNORMAL LOW (ref 12.0–15.0)
Immature Granulocytes: 0 %
Lymphocytes Relative: 3 %
Lymphs Abs: 0.2 10*3/uL — ABNORMAL LOW (ref 0.7–4.0)
MCH: 31.3 pg (ref 26.0–34.0)
MCHC: 29.7 g/dL — ABNORMAL LOW (ref 30.0–36.0)
MCV: 105.5 fL — ABNORMAL HIGH (ref 80.0–100.0)
Monocytes Absolute: 0.4 10*3/uL (ref 0.1–1.0)
Monocytes Relative: 5 %
Neutro Abs: 6.6 10*3/uL (ref 1.7–7.7)
Neutrophils Relative %: 92 %
Platelets: 93 10*3/uL — ABNORMAL LOW (ref 150–400)
RBC: 3.29 MIL/uL — ABNORMAL LOW (ref 3.87–5.11)
RDW: 15.5 % (ref 11.5–15.5)
WBC: 7.2 10*3/uL (ref 4.0–10.5)
nRBC: 0 % (ref 0.0–0.2)

## 2019-01-02 LAB — BASIC METABOLIC PANEL
Anion gap: 10 (ref 5–15)
BUN: 40 mg/dL — ABNORMAL HIGH (ref 8–23)
CO2: 42 mmol/L — ABNORMAL HIGH (ref 22–32)
Calcium: 8.1 mg/dL — ABNORMAL LOW (ref 8.9–10.3)
Chloride: 96 mmol/L — ABNORMAL LOW (ref 98–111)
Creatinine, Ser: 0.74 mg/dL (ref 0.44–1.00)
GFR calc Af Amer: >60 mL/min (ref >60)
GFR calc non Af Amer: >60 mL/min (ref >60)
Glucose, Bld: 187 mg/dL — ABNORMAL HIGH (ref 70–99)
Potassium: 3.7 mmol/L (ref 3.5–5.1)
Sodium: 148 mmol/L — ABNORMAL HIGH (ref 135–145)

## 2019-01-02 LAB — CULTURE, BLOOD (ROUTINE X 2)
Culture: NO GROWTH
Culture: NO GROWTH
Special Requests: ADEQUATE

## 2019-01-02 LAB — GLUCOSE, CAPILLARY
Glucose-Capillary: 159 mg/dL — ABNORMAL HIGH (ref 70–99)
Glucose-Capillary: 180 mg/dL — ABNORMAL HIGH (ref 70–99)
Glucose-Capillary: 201 mg/dL — ABNORMAL HIGH (ref 70–99)
Glucose-Capillary: 219 mg/dL — ABNORMAL HIGH (ref 70–99)
Glucose-Capillary: 222 mg/dL — ABNORMAL HIGH (ref 70–99)
Glucose-Capillary: 237 mg/dL — ABNORMAL HIGH (ref 70–99)

## 2019-01-02 LAB — LACTATE DEHYDROGENASE: LDH: 435 U/L — ABNORMAL HIGH (ref 98–192)

## 2019-01-02 LAB — PHOSPHORUS: Phosphorus: 2.6 mg/dL (ref 2.5–4.6)

## 2019-01-02 LAB — MAGNESIUM: Magnesium: 2.4 mg/dL (ref 1.7–2.4)

## 2019-01-02 LAB — D-DIMER, QUANTITATIVE: D-Dimer, Quant: 2.66 ug/mL-FEU — ABNORMAL HIGH (ref 0.00–0.50)

## 2019-01-02 LAB — ECHOCARDIOGRAM LIMITED
Height: 65 in
Weight: 2776.03 oz

## 2019-01-02 LAB — FERRITIN: Ferritin: 133 ng/mL (ref 11–307)

## 2019-01-02 LAB — VITAMIN B12: Vitamin B-12: 935 pg/mL — ABNORMAL HIGH (ref 180–914)

## 2019-01-02 LAB — PROCALCITONIN: Procalcitonin: 0.31 ng/mL

## 2019-01-02 LAB — TROPONIN I (HIGH SENSITIVITY): Troponin I (High Sensitivity): 1510 ng/L (ref <18)

## 2019-01-02 MED ORDER — DOCUSATE SODIUM 50 MG/5ML PO LIQD
100.0000 mg | Freq: Two times a day (BID) | ORAL | Status: DC
  Administered 2019-01-02 – 2019-01-16 (×26): 100 mg
  Filled 2019-01-02 (×27): qty 10

## 2019-01-02 MED ORDER — ADULT MULTIVITAMIN LIQUID CH
15.0000 mL | Freq: Every day | ORAL | Status: DC
  Administered 2019-01-02 – 2019-01-16 (×15): 15 mL via ORAL
  Filled 2019-01-02 (×15): qty 15

## 2019-01-02 MED ORDER — FUROSEMIDE 10 MG/ML IJ SOLN
40.0000 mg | Freq: Every day | INTRAMUSCULAR | Status: AC
  Administered 2019-01-02 – 2019-01-03 (×2): 40 mg via INTRAVENOUS
  Filled 2019-01-02 (×2): qty 4

## 2019-01-02 MED ORDER — POTASSIUM CHLORIDE 20 MEQ/15ML (10%) PO SOLN
40.0000 meq | Freq: Every day | ORAL | Status: AC
  Administered 2019-01-02 – 2019-01-03 (×2): 40 meq
  Filled 2019-01-02 (×2): qty 30

## 2019-01-02 MED ORDER — SENNOSIDES 8.8 MG/5ML PO SYRP
5.0000 mL | ORAL_SOLUTION | Freq: Two times a day (BID) | ORAL | Status: DC
  Administered 2019-01-02 – 2019-01-09 (×12): 5 mL
  Filled 2019-01-02 (×13): qty 5

## 2019-01-02 MED ORDER — FOLIC ACID 1 MG PO TABS
1.0000 mg | ORAL_TABLET | Freq: Every day | ORAL | Status: DC
  Administered 2019-01-02 – 2019-01-16 (×15): 1 mg
  Filled 2019-01-02 (×15): qty 1

## 2019-01-02 MED ORDER — CYANOCOBALAMIN 250 MCG PO TABS
250.0000 ug | ORAL_TABLET | Freq: Every day | ORAL | Status: DC
  Administered 2019-01-02 – 2019-01-16 (×15): 250 ug via ORAL
  Filled 2019-01-02 (×16): qty 1

## 2019-01-02 MED ORDER — POLYETHYLENE GLYCOL 3350 17 G PO PACK
17.0000 g | PACK | Freq: Two times a day (BID) | ORAL | Status: DC
  Administered 2019-01-02 – 2019-01-07 (×5): 17 g
  Filled 2019-01-02 (×7): qty 1

## 2019-01-02 MED ORDER — FREE WATER
200.0000 mL | Status: DC
  Administered 2019-01-02 – 2019-01-03 (×6): 200 mL

## 2019-01-02 MED ORDER — VITAMIN B-1 100 MG PO TABS
100.0000 mg | ORAL_TABLET | Freq: Every day | ORAL | Status: DC
  Administered 2019-01-02 – 2019-01-16 (×15): 100 mg
  Filled 2019-01-02 (×16): qty 1

## 2019-01-02 NOTE — Progress Notes (Signed)
   01/02/19 1400  Clinical Encounter Type  Visited With Family;Other (Comment) Kim Harrison (Son-in-law's mother))  Visit Type Initial;Psychological support;Spiritual support  Referral From Family  Consult/Referral To Chaplain  Spiritual Encounters  Spiritual Needs Emotional;Other (Comment) (Spiritual Care Conversation/Support)  Stress Factors  Patient Stress Factors Not reviewed  Family Stress Factors Health changes;Lack of knowledge;Major life changes   Kim Harrison, the patient's son-in-law's mother, contacted the Spiritual Care department inquiring about our services. Arbie Cookey states that [employer] daughter is having a difficult time with her illness and feels that she needs support. I let her know what our department offers, and let her know how to contact me.   Please, contact Spiritual Care for further assistance.   Chaplain Shanon Ace M.Div., [facility]

## 2019-01-02 NOTE — Progress Notes (Signed)
Patient placed in prone position.  ETT secured with cloth tape.  Patient tolerated well.  No complications noted.

## 2019-01-02 NOTE — Progress Notes (Signed)
OT Cancellation Note  Patient Details Name: Kim Harrison MRN: [id number] DOB: January 18, 1956   Cancelled Treatment:    Reason Eval/Treat Not Completed: Patient not medically ready  Heiley Shaikh,HILLARY 01/02/2019, 4:31 PM  Maurie Boettcher, OT/L   Acute OT Clinical Specialist Acute Rehabilitation Services Pager [phone] Office [phone]

## 2019-01-02 NOTE — Plan of Care (Addendum)
Troponin I trended this shift: peaked at 1934  now at 1510. Started on Aspirin this shift. Patient already on arixtra. No further episode of A fib with RVR. Levophed at 2.5 mcg/min. Awaiting departmental echo to assess LV/RV function.  Problem: Clinical Measurements: Goal: Will remain free from infection Outcome: Progressing Goal: Diagnostic test results will improve Outcome: Progressing Goal: Respiratory complications will improve Outcome: Progressing Goal: Cardiovascular complication will be avoided Outcome: Progressing   Problem: Activity: Goal: Risk for activity intolerance will decrease Outcome: Progressing   Problem: Nutrition: Goal: Adequate nutrition will be maintained Outcome: Progressing   Problem: Elimination: Goal: Will not experience complications related to bowel motility Outcome: Progressing   Problem: Pain Managment: Goal: General experience of comfort will improve Outcome: Progressing   Problem: Safety: Goal: Ability to remain free from injury will improve Outcome: Progressing   Problem: Skin Integrity: Goal: Risk for impaired skin integrity will decrease Outcome: Progressing   Problem: Education: Goal: Knowledge of risk factors and measures for prevention of condition will improve Outcome: Not Progressing   Problem: Coping: Goal: Psychosocial and spiritual needs will be supported Outcome: Not Progressing   Problem: Respiratory: Goal: Will maintain a patent airway Outcome: Not Progressing Goal: Complications related to the disease process, condition or treatment will be avoided or minimized Outcome: Not Progressing   Problem: Education: Goal: Knowledge of General Education information will improve Description: Including pain rating scale, medication(s)/side effects and non-pharmacologic comfort measures Outcome: Not Progressing   Problem: Health Behavior/Discharge Planning: Goal: Ability to manage health-related needs will improve Outcome: Not  Progressing   Problem: Clinical Measurements: Goal: Ability to maintain clinical measurements within normal limits will improve Outcome: Not Progressing

## 2019-01-02 NOTE — Progress Notes (Signed)
  Echocardiogram 2D Echocardiogram limited has been performed.  Darlina Sicilian M 01/02/2019, 9:24 AM

## 2019-01-02 NOTE — Progress Notes (Signed)
NAME:  Kim Harrison, MRN:  [id number], DOB:  May 22, 1955, LOS: 5 ADMISSION DATE:  12/28/2018, CONSULTATION DATE: 11/30 REFERRING MD: Dr. Sherral Hammers, CHIEF COMPLAINT: Shortness of breath  Brief History   63 year old female admitted 11/9 with a 10-day history of shortness of breath.  Outpatient testing for Covid was initially negative, then positive on 10/30. As an outpatient she completed 2 rounds of antibiotics and steroids. She presented to [facility] on 11/9 after a syncopal episode and was found to be COVID-19 positive.  After transfer to [facility] the patient was treated with steroids, remdesivir, convalescent plasma and 2 doses of tocilizumab.  She required high flow oxygen.  Follow-up CTA imaging of the chest revealed bilateral groundglass opacities, septal thickening and no PE.  Course notable for worsening on 11/27 with hypotension and worsening of left lower lobe infiltrate.  Cultures were obtained and the patient was treated for suspected superimposed bacterial pneumonia.  On 11/30 she decompensated requiring intubation.   Past Medical History  Sarcoidosis Hypothyroidism Dyslipidemia Anxiety Cervical Cancer   Significant Hospital Events   11/09 Admit 11/14 Transfer to ICU  11/17 Transfer to PCU 11/27 Worsening of hypoxemic respiratory failure 11/29 Transfer back to ICU for worsening hypoxemia  11/30 Decompensation requiring intubation, paralytics 12/01 Prone positioning, acidosis overnight 12/02 New AF, later RVR in pm.  Prone positioning held.  RBBB on EKG.  ECHO w/o RV fx/septal bowing  Consults:  PCCM  Procedures:  ETT 11/30 >> L IJ TLC 11/30 >>  R Rad Aline 12/1 >>   Significant Diagnostic Tests:   CTA Chest 11/9 >> negative for PE, interlobular septal thickening, bilateral groundglass opacities with lower lobe predominance  CTA Chest 11/14 >> negative for PE, moderate worsening of groundglass opacities and septal thickening since prior exam superimposed on  changes of sarcoidosis  ECHO 11/28 >> LVEF 60 to 65%, LVH, grade 1 diastolic dysfunction, LA/RA normal size, trace MVR, mild TVR, no evidence of left ear, mildly elevated pulmonary artery systolic pressure  Micro Data:  MRSA PCR 11/14 >> negative  BCx2 11/28 >> negative  Antimicrobials:  Remdesivir 11/9 >> 11/14  Convalescent plasma 11/11  Toclizumab 11/11 & 11/13  Aztreonam 11/28 >>   Interim history/subjective:  Tmax 99.  Episode of AF late PM 12/2 > back to SR.  EKG with RBBB.  Prone positioning held.  I/O-1.7L UOP in 24h, 1.3L+ in 24h. RN/RT report pt appears uncomfortable on vent.  Increased levophed requirement & sedation.    Objective   Blood pressure 117/71, pulse 74, temperature 99 F (37.2 C), temperature source Oral, resp. rate (!) 30, height 5\' 5"  (1.651 m), weight 78.7 kg, SpO2 94 %. CVP:  [9 mmHg-11 mmHg] 10 mmHg  Vent Mode: PRVC FiO2 (%):  [70 %-90 %] 70 % Set Rate:  [30 bmp-35 bmp] 30 bmp Vt Set:  [340 mL-400 mL] 340 mL PEEP:  [16 cmH20] 16 cmH20 Plateau Pressure:  [30 cmH20-38 cmH20] 33 cmH20   Intake/Output Summary (Last 24 hours) at 01/02/2019 0807 Last data filed at 01/02/2019 0700 Gross per 24 hour  Intake 2895.6 ml  Output 1675 ml  Net 1220.6 ml   Filed Weights   12/30/18 0412 01/01/19 0444 01/02/19 0359  Weight: 68.4 kg 78.7 kg 78.7 kg    Examination: General: Critically ill-appearing adult female lying in bed in no acute distress HEENT: MM pink/moist, ETT, pupils 2 mm, anicteric Neuro: Sedate/paralyzed on vent CV: s1s2 RRR, no m/r/g, radial pulses +2 PULM: Nonlabored on vent, coarse breath  sounds bilaterally/distant, without wheezing GI: soft, bsx4 active  Extremities: warm/dry, generalized 1-2+ pitting edema  Skin: no rashes or lesions  Resolved Hospital Problem list      Assessment & Plan:   Severe ARDS secondary to COVID PNA  Acute Hypoxemic / Hypercarbic Respiratory Failure  Sarcoidosis P/F Ratio 71, PIP 39, Plateau 38, driving  pressure 22 -low Vt ventilation 4-8cc/kg -goal plateau pressure <30, driving pressure [id number] cm H2O -target PaO2 55-65, titrate PEEP/FiO2 per ARDS protocol  -if P/F ratio <150, consider prone therapy for 16 hours per day -goal CVP <4, diuresis as necessary -VAP prevention measures  -follow intermittent CXR  -repeat lasix 12/3, 40mg  QDx2 doses -hold further prone positioning   Need for Sedation due to Mechanical Ventilation  -RASS Goal -4 to -5 with PRN NMB use -continue PRN NMB for ventilator synchrony  -PAD protocol   Shock  Baseline SBP ~90-100, not on antihypertensives.  Suspect component of sedation.  -stress dose steroids  -continue levophed for MAP > 65 -aztreonam D6/7 -trend PCT  -follow cultures   Paroxysmal Atrial Fibrillation  RBBB Elevated Troponin  Brief episode of PAF on 12/2 with hemodynamic instability. RBBB present since 11/9.  Elevated troponin demand ischemia vs NSTEMI -tele monitoring -monitor for PAF, if persistent will need rhythm/rate controlling agent  Macrocytic Anemia  -follow CBC -add B12, folate, thiamine, MVI  Elevated LFT's  Suspect element of shock liver -trend LFT's  -avoid hepatotoxic agents   Hypernatremia -increase free water to 200 ml Q4  -follow Na trend   Hypocalcemia -corrected for albumin, 8.9  -follow, replace as indicated   Hyperglycemia  Episode of hypoglycemia am 12/2 to 68, lantus / TF coverage stopped.  -SSI Q4, moderate scale   Anemia  Thrombocytopenia  -Trend CBC  -transfuse for Hgb <7%  Anxiety -continue lexapro, klonopin   At Risk Malnutrition  -TF per Nutrition   Best practice:  Diet: TF Pain/Anxiety/Delirium protocol (if indicated): in place  VAP protocol (if indicated): in place  DVT prophylaxis: fondaparinux GI prophylaxis: pepcid  Glucose control: SSI Mobility: BR  Code Status: Full Code  Family Communication: Husband Shanon Brow [phone]) updated via phone 12/3. Burns Spain (daughter  [phone]).  Wetzel Bjornstad (daughter [phone]).  Husband works as a Engineer, drilling and at times has limited cell signal.  Please call Wells Guiles first if he can not be reached.  Disposition: ICU   Labs   CBC: Recent Labs  Lab 12/28/18 1200 12/29/18 0201  12/30/18 0610  12/31/18 0515  12/31/18 1416 01/01/19 0434 01/01/19 0500 01/01/19 1539 01/02/19 0415  WBC 12.0* 12.6*  --  11.9*  --  18.8*  --   --   --  9.9  --  7.2  NEUTROABS 10.2* 12.0*  --   --   --  17.6*  --   --   --  9.2*  --  6.6  HGB 13.6 11.4*   < > 11.8*   < > 11.1*   < > 10.9* 10.5* 10.3* 10.5* 10.3*  HCT 42.3 34.7*   < > 35.1*   < > 36.2   < > 32.0* 31.0* 34.9* 31.0* 34.7*  MCV 95.9 93.8  --  93.9  --  101.1*  --   --   --  105.4*  --  105.5*  PLT 125* 118*  --  99*  --  103*  --   --   --  84*  --  93*   < > = values in this interval  not displayed.    Basic Metabolic Panel: Recent Labs  Lab 12/30/18 1740  12/31/18 0020  12/31/18 0515  12/31/18 1915 01/01/19 0434 01/01/19 0500 01/01/19 1539 01/01/19 1900 01/02/19 0415  NA  --    < > 149*   < > 152*   < >  --  148* 152* 149* 150* 148*  K  --    < > 4.5   < > 4.4   < >  --  3.5 3.4* 3.6 3.7 3.7  CL  --   --  99  --  97*  --   --   --  97*  --  97* 96*  CO2  --   --  40*  --  41*  --   --   --  46*  --  44* 42*  GLUCOSE  --   --  323*  --  231*  --   --   --  133*  --  175* 187*  BUN  --   --  33*  --  31*  --   --   --  33*  --  36* 40*  CREATININE  --   --  1.15*  --  1.13*  --   --   --  0.88  --  0.69 0.74  CALCIUM  --   --  7.4*  --  7.0*  --   --   --  7.9*  --  8.0* 8.1*  MG 2.3  --   --   --  2.0  --  2.0  --  2.2  --   --  2.4  PHOS 5.5*  --   --   --  8.1*  --  3.1  --  3.6  --   --  2.6   < > = values in this interval not displayed.   GFR: Estimated Creatinine Clearance: 73.7 mL/min (by C-G formula based on SCr of 0.74 mg/dL). Recent Labs  Lab 12/30/18 0610 12/30/18 0617 12/31/18 0515 01/01/19 0500 01/02/19 0415  PROCALCITON  --  0.24  0.35 0.52 0.31  WBC 11.9*  --  18.8* 9.9 7.2    Liver Function Tests: Recent Labs  Lab 12/29/18 0201 12/30/18 0610 12/31/18 0515 01/01/19 0500 01/01/19 1900  AST 29 24 110* 104* 141*  ALT 47* 40 187* 214* 297*  ALKPHOS 64 86 108 91 100  BILITOT 0.6 0.4 1.5* 0.6 0.5  PROT 4.4* 4.5* 4.6* 4.6* 4.7*  ALBUMIN 2.2* 2.3* 2.9* 2.7* 2.6*   No results for input(s): LIPASE, AMYLASE in the last 168 hours. No results for input(s): AMMONIA in the last 168 hours.  ABG    Component Value Date/Time   PHART 7.386 01/01/2019 1539   PCO2ART 85.3 (HH) 01/01/2019 1539   PO2ART 104.0 01/01/2019 1539   HCO3 51.0 (H) 01/01/2019 1539   TCO2 >50 (H) 01/01/2019 1539   O2SAT 97.0 01/01/2019 1539     Coagulation Profile: No results for input(s): INR, PROTIME in the last 168 hours.  Cardiac Enzymes: No results for input(s): CKTOTAL, CKMB, CKMBINDEX, TROPONINI in the last 168 hours.  HbA1C: Hgb A1c MFr Bld  Date/Time Value Ref Range Status  12/15/2018 06:27 AM 6.2 (H) 4.8 - 5.6 % Final    Comment:    (NOTE) Pre diabetes:          5.7%-6.4% Diabetes:              >6.4% Glycemic control for   <  7.0% adults with diabetes     CBG: Recent Labs  Lab 01/01/19 1239 01/01/19 1608 01/01/19 1950 01/01/19 2348 01/02/19 0354  GLUCAP 105* 143* 151* 219* 159*     Critical care time: 30 minutes      Noe Gens, MSN, NP-C [facility] 01/02/2019, 8:07 AM   Please see [email or web address] for pager details.

## 2019-01-02 NOTE — Progress Notes (Signed)
Updates given through tele visit.As per discussion with NIC I did not told the family about pt having NSTEMI. Will wait for the am for the MD's to update.

## 2019-01-03 ENCOUNTER — Inpatient Hospital Stay (HOSPITAL_COMMUNITY): Payer: BC Managed Care – PPO

## 2019-01-03 DIAGNOSIS — J069 Acute upper respiratory infection, unspecified: Secondary | ICD-10-CM | POA: Diagnosis not present

## 2019-01-03 DIAGNOSIS — J9601 Acute respiratory failure with hypoxia: Secondary | ICD-10-CM | POA: Diagnosis not present

## 2019-01-03 DIAGNOSIS — U071 COVID-19: Secondary | ICD-10-CM | POA: Diagnosis not present

## 2019-01-03 LAB — CBC WITH DIFFERENTIAL/PLATELET
Abs Immature Granulocytes: 0.02 10*3/uL (ref 0.00–0.07)
Basophils Absolute: 0 10*3/uL (ref 0.0–0.1)
Basophils Relative: 0 %
Eosinophils Absolute: 0 10*3/uL (ref 0.0–0.5)
Eosinophils Relative: 0 %
HCT: 31.9 % — ABNORMAL LOW (ref 36.0–46.0)
Hemoglobin: 9.5 g/dL — ABNORMAL LOW (ref 12.0–15.0)
Immature Granulocytes: 0 %
Lymphocytes Relative: 3 %
Lymphs Abs: 0.2 10*3/uL — ABNORMAL LOW (ref 0.7–4.0)
MCH: 30.9 pg (ref 26.0–34.0)
MCHC: 29.8 g/dL — ABNORMAL LOW (ref 30.0–36.0)
MCV: 103.9 fL — ABNORMAL HIGH (ref 80.0–100.0)
Monocytes Absolute: 0.3 10*3/uL (ref 0.1–1.0)
Monocytes Relative: 6 %
Neutro Abs: 4.8 10*3/uL (ref 1.7–7.7)
Neutrophils Relative %: 91 %
Platelets: 88 10*3/uL — ABNORMAL LOW (ref 150–400)
RBC: 3.07 MIL/uL — ABNORMAL LOW (ref 3.87–5.11)
RDW: 15 % (ref 11.5–15.5)
WBC: 5.3 10*3/uL (ref 4.0–10.5)
nRBC: 0 % (ref 0.0–0.2)

## 2019-01-03 LAB — POCT I-STAT 7, (LYTES, BLD GAS, ICA,H+H)
Acid-Base Excess: 17 mmol/L — ABNORMAL HIGH (ref 0.0–2.0)
Bicarbonate: 44.2 mmol/L — ABNORMAL HIGH (ref 20.0–28.0)
Calcium, Ion: 1.25 mmol/L (ref 1.15–1.40)
HCT: 29 % — ABNORMAL LOW (ref 36.0–46.0)
Hemoglobin: 9.9 g/dL — ABNORMAL LOW (ref 12.0–15.0)
O2 Saturation: 86 %
Patient temperature: 36.4
Potassium: 3.7 mmol/L (ref 3.5–5.1)
Sodium: 146 mmol/L — ABNORMAL HIGH (ref 135–145)
TCO2: 46 mmol/L — ABNORMAL HIGH (ref 22–32)
pCO2 arterial: 71.8 mmHg (ref 32.0–48.0)
pH, Arterial: 7.395 (ref 7.350–7.450)
pO2, Arterial: 53 mmHg — ABNORMAL LOW (ref 83.0–108.0)

## 2019-01-03 LAB — PHOSPHORUS: Phosphorus: 2.9 mg/dL (ref 2.5–4.6)

## 2019-01-03 LAB — D-DIMER, QUANTITATIVE: D-Dimer, Quant: 2.13 ug/mL-FEU — ABNORMAL HIGH (ref 0.00–0.50)

## 2019-01-03 LAB — GLUCOSE, CAPILLARY
Glucose-Capillary: 161 mg/dL — ABNORMAL HIGH (ref 70–99)
Glucose-Capillary: 165 mg/dL — ABNORMAL HIGH (ref 70–99)
Glucose-Capillary: 166 mg/dL — ABNORMAL HIGH (ref 70–99)
Glucose-Capillary: 186 mg/dL — ABNORMAL HIGH (ref 70–99)
Glucose-Capillary: 187 mg/dL — ABNORMAL HIGH (ref 70–99)
Glucose-Capillary: 201 mg/dL — ABNORMAL HIGH (ref 70–99)
Glucose-Capillary: 204 mg/dL — ABNORMAL HIGH (ref 70–99)
Glucose-Capillary: 208 mg/dL — ABNORMAL HIGH (ref 70–99)

## 2019-01-03 LAB — BASIC METABOLIC PANEL
Anion gap: 9 (ref 5–15)
BUN: 42 mg/dL — ABNORMAL HIGH (ref 8–23)
CO2: 42 mmol/L — ABNORMAL HIGH (ref 22–32)
Calcium: 8.4 mg/dL — ABNORMAL LOW (ref 8.9–10.3)
Chloride: 99 mmol/L (ref 98–111)
Creatinine, Ser: 0.65 mg/dL (ref 0.44–1.00)
GFR calc Af Amer: >60 mL/min (ref >60)
GFR calc non Af Amer: >60 mL/min (ref >60)
Glucose, Bld: 220 mg/dL — ABNORMAL HIGH (ref 70–99)
Potassium: 3.8 mmol/L (ref 3.5–5.1)
Sodium: 150 mmol/L — ABNORMAL HIGH (ref 135–145)

## 2019-01-03 LAB — FERRITIN: Ferritin: 126 ng/mL (ref 11–307)

## 2019-01-03 LAB — MAGNESIUM: Magnesium: 2.5 mg/dL — ABNORMAL HIGH (ref 1.7–2.4)

## 2019-01-03 LAB — LACTATE DEHYDROGENASE: LDH: 355 U/L — ABNORMAL HIGH (ref 98–192)

## 2019-01-03 MED ORDER — CHLORHEXIDINE GLUCONATE 0.12 % MT SOLN
15.0000 mL | OROMUCOSAL | Status: DC | PRN
  Administered 2019-01-09: 15 mL via OROMUCOSAL
  Filled 2019-01-03 (×4): qty 15

## 2019-01-03 MED ORDER — STERILE WATER FOR INJECTION IJ SOLN
INTRAMUSCULAR | Status: AC
  Administered 2019-01-04: 02:00:00 10 mL
  Filled 2019-01-03: qty 10

## 2019-01-03 MED ORDER — FREE WATER
250.0000 mL | Status: DC
  Administered 2019-01-03 – 2019-01-07 (×24): 250 mL

## 2019-01-03 NOTE — Progress Notes (Signed)
NAME:  Kim Harrison, MRN:  [id number], DOB:  May 22, 1955, LOS: 24 ADMISSION DATE:  12/27/2018, CONSULTATION DATE: 11/30 REFERRING MD: Dr. Sherral Hammers, CHIEF COMPLAINT: Shortness of breath  Brief History   63 year old female admitted 11/9 with a 10-day history of shortness of breath.  Outpatient testing for Covid was initially negative, then positive on 10/30. As an outpatient she completed 2 rounds of antibiotics and steroids. She presented to [facility] on 11/9 after a syncopal episode and was found to be COVID-19 positive.  After transfer to [facility] the patient was treated with steroids, remdesivir, convalescent plasma and 2 doses of tocilizumab.  She required high flow oxygen.  Follow-up CTA imaging of the chest revealed bilateral groundglass opacities, septal thickening and no PE.  Course notable for worsening on 11/27 with hypotension and worsening of left lower lobe infiltrate.  Cultures were obtained and the patient was treated for suspected superimposed bacterial pneumonia.  On 11/30 she decompensated requiring intubation.   Past Medical History  Sarcoidosis Hypothyroidism Dyslipidemia Anxiety Cervical Cancer   Significant Hospital Events   11/09 Admit 11/14 Transfer to ICU  11/17 Transfer to PCU 11/27 Worsening of hypoxemic respiratory failure 11/29 Transfer back to ICU for worsening hypoxemia  11/30 Decompensation requiring intubation, paralytics 12/01 Prone positioning, acidosis overnight 12/02 New AF, later RVR in pm.  Prone held.  RBBB on EKG. Bedside ECHO w/o RV fx/septal bowing  Consults:  PCCM  Procedures:  ETT 11/30 >> L IJ TLC 11/30 >>  R Rad Aline 12/1 >>   Significant Diagnostic Tests:   CTA Chest 11/9 >> negative for PE, interlobular septal thickening, bilateral groundglass opacities with lower lobe predominance  CTA Chest 11/14 >> negative for PE, moderate worsening of groundglass opacities and septal thickening since prior exam superimposed on changes of  sarcoidosis  ECHO 11/28 >> LVEF 60 to 65%, LVH, grade 1 diastolic dysfunction, LA/RA normal size, trace MVR, mild TVR, no evidence of left ear, mildly elevated pulmonary artery systolic pressure  ECHO [id number] >> LVEF 55-60%, normal LV function, no LVH, global RV normal systolic function, RV moderately enlarged, LA normal, RA mildly dilated, trace MVR, mild TVR, PVR trivial, mildly elevated PAS pressure  Micro Data:  MRSA PCR 11/14 >> negative  BCx2 11/28 >> negative  Antimicrobials:  Remdesivir 11/9 >> 11/14  Convalescent plasma 11/11  Toclizumab 11/11 & 11/13  Aztreonam 11/28 >> 12/4  Interim history/subjective:  Tmax 98.4.  I/O - 1.9L UOP in 24h, net 562+ for 24h.  Pt prone at ~ 5pm 12/3. Repeat limited ECHO without acute findings.  RN reports mild skin breakdown on face.  Objective   Blood pressure 100/68, pulse 61, temperature 98.4 F (36.9 C), temperature source Oral, resp. rate (!) 30, height 5\' 5"  (1.651 m), weight 80.3 kg, SpO2 (!) 89 %. CVP:  [12 mmHg-13 mmHg] 13 mmHg  Vent Mode: PRVC FiO2 (%):  [70 %] 70 % Set Rate:  [30 bmp] 30 bmp Vt Set:  [340 mL] 340 mL PEEP:  [14 cmH20-16 cmH20] 14 cmH20 Plateau Pressure:  [23 cmH20-32 cmH20] 23 cmH20   Intake/Output Summary (Last 24 hours) at 01/03/2019 0829 Last data filed at 01/03/2019 0600 Gross per 24 hour  Intake 2425.38 ml  Output 1845 ml  Net 580.38 ml   Filed Weights   01/01/19 0444 01/02/19 0359 01/03/19 0443  Weight: 78.7 kg 78.7 kg 80.3 kg    Examination: General: Critically ill-appearing adult female lying in bed in no acute distress HEENT: MM pink/moist, ETT,  pupils 2 mm, anicteric  Neuro: Sedate on vent CV: s1s2 RRR, no m/r/g PULM: Nonlabored, coarse breath sounds/distant bilaterally, P/F <150, plateau 23 GI: soft, bsx4 active  Extremities: warm/dry, 1-2+ generalized pitting edema  Skin: no rashes or lesions  Resolved Hospital Problem list      Assessment & Plan:   Severe ARDS secondary to COVID PNA   Acute Hypoxemic / Hypercarbic Respiratory Failure  Sarcoidosis Completed remdesivir, decadron. P/F Ratio 71, PIP 39, Plateau 38, driving pressure 22 -low Vt ventilation 4-8cc/kg -goal plateau pressure <30, driving pressure [id number] cm H2O -target PaO2 55-65, titrate PEEP/FiO2 per ARDS protocol  -if P/F ratio <150, consider prone therapy for 16 hours per day -goal CVP <4, diuresis as necessary -VAP prevention measures  -follow intermittent CXR  -lasix 40mg  x1 -plan to position supine ~0900  Need for Sedation due to Mechanical Ventilation  -RASS goal -4 to -5 if NMB use -PRN NMB for ventilator synchrony  -PAD protocol   Shock  Baseline SBP ~90-100, not on antihypertensives.  Suspect component of sedation.  -stop stress dose steroids -D7/7 aztreonam  -trend PCT -follow cultures -levophed PRN for MAP >65  Paroxysmal Atrial Fibrillation  RBBB Elevated Troponin  Brief episode of PAF on 12/2 with hemodynamic instability. RBBB present since 11/9.  Elevated troponin demand ischemia vs NSTEMI -tele monitoring  -repeat limited ECHO without new findings  -monitor for PAF, if persistent will need rhythm/rate controlling agent  Macrocytic Anemia  -follow CBC -continue folate, MVI, thiamine, B12  Elevated LFT's  Suspect element of shock liver -monitor LFT's -avoid hepatotoxic agents  Hypernatremia -free water 250 ml Q4 -follow Na trend  Hypocalcemia -follow, replace as indicated  Hyperglycemia  Episode of hypoglycemia am 12/2 to 68, lantus / TF coverage stopped.  -glucose range 130-220 -Q4 SSI, moderate scale  Anemia  Thrombocytopenia  -follow CBC -transfuse for Hgb <7%   Anxiety -continue lexapro, klonopin   At Risk Malnutrition  -TF, prostat per nutrition   Best practice:  Diet: TF Pain/Anxiety/Delirium protocol (if indicated): in place  VAP protocol (if indicated): in place  DVT prophylaxis: fondaparinux GI prophylaxis: pepcid  Glucose control: SSI Mobility:  BR  Code Status: Full Code  Family Communication: Husband Shanon Brow [phone]) - unable to reach by phone12/4. Daughter Wells Guiles called as well, no answer / message left 12/4. Burns Spain (daughter [phone]).  Wetzel Bjornstad (daughter [phone]).  Husband works as a Engineer, drilling and at times has limited cell signal.  Please call Wells Guiles first if he can not be reached.  Disposition: ICU   Labs   CBC: Recent Labs  Lab 12/29/18 0201  12/30/18 0610  12/31/18 0515  01/01/19 0434 01/01/19 0500 01/01/19 1539 01/02/19 0415 01/03/19 0340  WBC 12.6*  --  11.9*  --  18.8*  --   --  9.9  --  7.2 5.3  NEUTROABS 12.0*  --   --   --  17.6*  --   --  9.2*  --  6.6 4.8  HGB 11.4*   < > 11.8*   < > 11.1*   < > 10.5* 10.3* 10.5* 10.3* 9.5*  HCT 34.7*   < > 35.1*   < > 36.2   < > 31.0* 34.9* 31.0* 34.7* 31.9*  MCV 93.8  --  93.9  --  101.1*  --   --  105.4*  --  105.5* 103.9*  PLT 118*  --  99*  --  103*  --   --  84*  --  93* 88*   < > = values in this interval not displayed.    Basic Metabolic Panel: Recent Labs  Lab 12/31/18 0515  12/31/18 1915  01/01/19 0500 01/01/19 1539 01/01/19 1900 01/02/19 0415 01/03/19 0340  NA 152*   < >  --    < > 152* 149* 150* 148* 150*  K 4.4   < >  --    < > 3.4* 3.6 3.7 3.7 3.8  CL 97*  --   --   --  97*  --  97* 96* 99  CO2 41*  --   --   --  46*  --  44* 42* 42*  GLUCOSE 231*  --   --   --  133*  --  175* 187* 220*  BUN 31*  --   --   --  33*  --  36* 40* 42*  CREATININE 1.13*  --   --   --  0.88  --  0.69 0.74 0.65  CALCIUM 7.0*  --   --   --  7.9*  --  8.0* 8.1* 8.4*  MG 2.0  --  2.0  --  2.2  --   --  2.4 2.5*  PHOS 8.1*  --  3.1  --  3.6  --   --  2.6 2.9   < > = values in this interval not displayed.   GFR: Estimated Creatinine Clearance: 74.4 mL/min (by C-G formula based on SCr of 0.65 mg/dL). Recent Labs  Lab 12/30/18 0617 12/31/18 0515 01/01/19 0500 01/02/19 0415 01/03/19 0340  PROCALCITON 0.24 0.35 0.52 0.31  --   WBC  --  18.8*  9.9 7.2 5.3    Liver Function Tests: Recent Labs  Lab 12/29/18 0201 12/30/18 0610 12/31/18 0515 01/01/19 0500 01/01/19 1900  AST 29 24 110* 104* 141*  ALT 47* 40 187* 214* 297*  ALKPHOS 64 86 108 91 100  BILITOT 0.6 0.4 1.5* 0.6 0.5  PROT 4.4* 4.5* 4.6* 4.6* 4.7*  ALBUMIN 2.2* 2.3* 2.9* 2.7* 2.6*   No results for input(s): LIPASE, AMYLASE in the last 168 hours. No results for input(s): AMMONIA in the last 168 hours.  ABG    Component Value Date/Time   PHART 7.386 01/01/2019 1539   PCO2ART 85.3 (HH) 01/01/2019 1539   PO2ART 104.0 01/01/2019 1539   HCO3 51.0 (H) 01/01/2019 1539   TCO2 >50 (H) 01/01/2019 1539   O2SAT 97.0 01/01/2019 1539     Coagulation Profile: No results for input(s): INR, PROTIME in the last 168 hours.  Cardiac Enzymes: No results for input(s): CKTOTAL, CKMB, CKMBINDEX, TROPONINI in the last 168 hours.  HbA1C: Hgb A1c MFr Bld  Date/Time Value Ref Range Status  12/15/2018 06:27 AM 6.2 (H) 4.8 - 5.6 % Final    Comment:    (NOTE) Pre diabetes:          5.7%-6.4% Diabetes:              >6.4% Glycemic control for   <7.0% adults with diabetes     CBG: Recent Labs  Lab 01/02/19 1550 01/02/19 2116 01/02/19 2349 01/03/19 0353 01/03/19 0801  GLUCAP 237* 201* 208* 204* 161*     Critical care time: 35 minutes      Noe Gens, MSN, NP-C [facility] 01/03/2019, 8:29 AM   Please see [email or web address] for pager details.

## 2019-01-03 NOTE — Progress Notes (Signed)
Pt's head turned at this time.  Pt desatted to lower 80s and began biting tube.  RN gave med to sedate and will have paralytic on next turn just in it's needed. RT will continue to monitor.

## 2019-01-03 NOTE — Progress Notes (Signed)
Pt proned without any complications.

## 2019-01-03 NOTE — Progress Notes (Signed)
Pts family doing e-link video visit with pt.

## 2019-01-03 NOTE — Progress Notes (Signed)
   01/03/19 1100  Clinical Encounter Type  Visited With Family;Other (Comment) Wells Guiles- Daughter)  Visit Type Follow-up;Psychological support;Spiritual support  Referral From Family  Consult/Referral To Chaplain  Spiritual Encounters  Spiritual Needs Emotional;Literature;Other (Comment) (Spiritual Care Conversation/Support)  Stress Factors  Patient Stress Factors Not reviewed  Family Stress Factors Health changes;Major life changes   I followed up with Mrs. Schneider's daughter, Wells Guiles. She was interested in resources, books or articles that might be uplifting for her during her mother's hospitalization. I provided her with some resources and support.  Wells Guiles is grateful for the care that her mother has received so far. Wells Guiles has two young children and this has put extra stress on her. She was appreciative of the support and would like a follow up.    Please, contact Spiritual Care for further assistance.   Chaplain Shanon Ace M.Div., [facility]

## 2019-01-03 NOTE — Progress Notes (Signed)
Attending:    Subjective: still hypoxemic Plateau 23 Not requiring paralytic Some face breakdown Pf < 150  Objective: Vitals:   01/03/19 0600 01/03/19 0700 01/03/19 0732 01/03/19 0800  BP: 98/62 (!) 92/57 (!) 92/57 100/68  Pulse: 65 63 61 61  Resp:   (!) 32 (!) 30  Temp:    98.4 F (36.9 C)  TempSrc:    Oral  SpO2: 90% (!) 89% 92% (!) 89%  Weight:      Height:       Vent Mode: PRVC FiO2 (%):  [70 %] 70 % Set Rate:  [30 bmp] 30 bmp Vt Set:  [340 mL] 340 mL PEEP:  [14 cmH20-16 cmH20] 14 cmH20 Plateau Pressure:  [23 cmH20-32 cmH20] 23 cmH20  Intake/Output Summary (Last 24 hours) at 01/03/2019 1054 Last data filed at 01/03/2019 0600 Gross per 24 hour  Intake 2282.9 ml  Output 1695 ml  Net 587.9 ml    General:  In bed on vent HENT: NCAT ETT in place PULM: Crackles bases B, vent supported breathing CV: RRR, no mgr GI: BS+, soft, nontender MSK: normal bulk and tone Neuro: sedated on vent   CBC    Component Value Date/Time   WBC 5.3 01/03/2019 0340   RBC 3.07 (L) 01/03/2019 0340   HGB 9.9 (L) 01/03/2019 1022   HCT 29.0 (L) 01/03/2019 1022   PLT 88 (L) 01/03/2019 0340   MCV 103.9 (H) 01/03/2019 0340   MCH 30.9 01/03/2019 0340   MCHC 29.8 (L) 01/03/2019 0340   RDW 15.0 01/03/2019 0340   LYMPHSABS 0.2 (L) 01/03/2019 0340   MONOABS 0.3 01/03/2019 0340   EOSABS 0.0 01/03/2019 0340   BASOSABS 0.0 01/03/2019 0340    BMET    Component Value Date/Time   NA 146 (H) 01/03/2019 1022   K 3.7 01/03/2019 1022   CL 99 01/03/2019 0340   CO2 42 (H) 01/03/2019 0340   GLUCOSE 220 (H) 01/03/2019 0340   BUN 42 (H) 01/03/2019 0340   CREATININE 0.65 01/03/2019 0340   CALCIUM 8.4 (L) 01/03/2019 0340   GFRNONAA >60 01/03/2019 0340   GFRAA >60 01/03/2019 0340    CXR images 12/4 severe diffuse interstitial opacification, ETT in place  Impression/Plan: ARDS: imrovong pressures, continue ARDS protocol, plan prone, lasix today,  Sedation needs: RASS target -4 to -5,  continue fentanyl/versed infusions, prn vec  Will be a long course on vent  Rest per NP note  My cc time 35 minutes  Roselie Awkward, MD [facility] Pager: [phone] Cell: [phone] After 3pm or if no response, call [phone]

## 2019-01-03 NOTE — Progress Notes (Signed)
Patient placed in supine position.  Patient tolerated well.  Small area of redness noted on left cheek.  Dressing applied.  ETT resecured with commercial tube holder.

## 2019-01-03 NOTE — Progress Notes (Signed)
Set up a schedule 2000 facetime video via elink with the pts husband, Shanon Brow. All questions were answered, husband was updated, and he said he would like to do a scheduled facetime call at the same time tomorrow 12/4. Will pass along information.

## 2019-01-04 DIAGNOSIS — J9601 Acute respiratory failure with hypoxia: Secondary | ICD-10-CM | POA: Diagnosis not present

## 2019-01-04 LAB — CBC WITH DIFFERENTIAL/PLATELET
Abs Immature Granulocytes: 0.04 10*3/uL (ref 0.00–0.07)
Basophils Absolute: 0 10*3/uL (ref 0.0–0.1)
Basophils Relative: 0 %
Eosinophils Absolute: 0.2 10*3/uL (ref 0.0–0.5)
Eosinophils Relative: 3 %
HCT: 33.2 % — ABNORMAL LOW (ref 36.0–46.0)
Hemoglobin: 9.9 g/dL — ABNORMAL LOW (ref 12.0–15.0)
Immature Granulocytes: 1 %
Lymphocytes Relative: 7 %
Lymphs Abs: 0.4 10*3/uL — ABNORMAL LOW (ref 0.7–4.0)
MCH: 31.1 pg (ref 26.0–34.0)
MCHC: 29.8 g/dL — ABNORMAL LOW (ref 30.0–36.0)
MCV: 104.4 fL — ABNORMAL HIGH (ref 80.0–100.0)
Monocytes Absolute: 0.2 10*3/uL (ref 0.1–1.0)
Monocytes Relative: 4 %
Neutro Abs: 4.2 10*3/uL (ref 1.7–7.7)
Neutrophils Relative %: 85 %
Platelets: 97 10*3/uL — ABNORMAL LOW (ref 150–400)
RBC: 3.18 MIL/uL — ABNORMAL LOW (ref 3.87–5.11)
RDW: 14.6 % (ref 11.5–15.5)
WBC: 5 10*3/uL (ref 4.0–10.5)
nRBC: 0 % (ref 0.0–0.2)

## 2019-01-04 LAB — POCT I-STAT 7, (LYTES, BLD GAS, ICA,H+H)
Acid-Base Excess: 20 mmol/L — ABNORMAL HIGH (ref 0.0–2.0)
Acid-Base Excess: 19 mmol/L — ABNORMAL HIGH (ref 0.0–2.0)
Bicarbonate: 47.9 mmol/L — ABNORMAL HIGH (ref 20.0–28.0)
Bicarbonate: 46.8 mmol/L — ABNORMAL HIGH (ref 20.0–28.0)
Calcium, Ion: 1.24 mmol/L (ref 1.15–1.40)
Calcium, Ion: 1.24 mmol/L (ref 1.15–1.40)
HCT: 28 % — ABNORMAL LOW (ref 36.0–46.0)
HCT: 29 % — ABNORMAL LOW (ref 36.0–46.0)
Hemoglobin: 9.5 g/dL — ABNORMAL LOW (ref 12.0–15.0)
Hemoglobin: 9.9 g/dL — ABNORMAL LOW (ref 12.0–15.0)
O2 Saturation: 91 %
O2 Saturation: 89 %
Patient temperature: 98.6
Patient temperature: 36.8
Potassium: 3.4 mmol/L — ABNORMAL LOW (ref 3.5–5.1)
Potassium: 3.5 mmol/L (ref 3.5–5.1)
Sodium: 144 mmol/L (ref 135–145)
Sodium: 144 mmol/L (ref 135–145)
TCO2: >50 mmol/L — ABNORMAL HIGH (ref 22–32)
TCO2: 49 mmol/L — ABNORMAL HIGH (ref 22–32)
pCO2 arterial: 80.5 mmHg (ref 32.0–48.0)
pCO2 arterial: 78.5 mmHg (ref 32.0–48.0)
pH, Arterial: 7.383 (ref 7.350–7.450)
pH, Arterial: 7.382 (ref 7.350–7.450)
pO2, Arterial: 67 mmHg — ABNORMAL LOW (ref 83.0–108.0)
pO2, Arterial: 61 mmHg — ABNORMAL LOW (ref 83.0–108.0)

## 2019-01-04 LAB — BASIC METABOLIC PANEL
Anion gap: 9 (ref 5–15)
BUN: 44 mg/dL — ABNORMAL HIGH (ref 8–23)
CO2: 47 mmol/L — ABNORMAL HIGH (ref 22–32)
Calcium: 8.7 mg/dL — ABNORMAL LOW (ref 8.9–10.3)
Chloride: 95 mmol/L — ABNORMAL LOW (ref 98–111)
Creatinine, Ser: 0.61 mg/dL (ref 0.44–1.00)
GFR calc Af Amer: >60 mL/min (ref >60)
GFR calc non Af Amer: >60 mL/min (ref >60)
Glucose, Bld: 178 mg/dL — ABNORMAL HIGH (ref 70–99)
Potassium: 3.3 mmol/L — ABNORMAL LOW (ref 3.5–5.1)
Sodium: 151 mmol/L — ABNORMAL HIGH (ref 135–145)

## 2019-01-04 LAB — LACTATE DEHYDROGENASE: LDH: 423 U/L — ABNORMAL HIGH (ref 98–192)

## 2019-01-04 LAB — GLUCOSE, CAPILLARY
Glucose-Capillary: 183 mg/dL — ABNORMAL HIGH (ref 70–99)
Glucose-Capillary: 157 mg/dL — ABNORMAL HIGH (ref 70–99)
Glucose-Capillary: 125 mg/dL — ABNORMAL HIGH (ref 70–99)
Glucose-Capillary: 102 mg/dL — ABNORMAL HIGH (ref 70–99)
Glucose-Capillary: 120 mg/dL — ABNORMAL HIGH (ref 70–99)

## 2019-01-04 LAB — D-DIMER, QUANTITATIVE: D-Dimer, Quant: 2.26 ug/mL-FEU — ABNORMAL HIGH (ref 0.00–0.50)

## 2019-01-04 LAB — MAGNESIUM: Magnesium: 2.2 mg/dL (ref 1.7–2.4)

## 2019-01-04 LAB — SEDIMENTATION RATE: Sed Rate: 10 mm/hr (ref 0–22)

## 2019-01-04 LAB — FERRITIN: Ferritin: 121 ng/mL (ref 11–307)

## 2019-01-04 LAB — PHOSPHORUS: Phosphorus: 3.7 mg/dL (ref 2.5–4.6)

## 2019-01-04 LAB — BRAIN NATRIURETIC PEPTIDE: B Natriuretic Peptide: 451 pg/mL — ABNORMAL HIGH (ref 0.0–100.0)

## 2019-01-04 MED ORDER — POTASSIUM CHLORIDE 20 MEQ/15ML (10%) PO SOLN
40.0000 meq | Freq: Once | ORAL | Status: AC
  Administered 2019-01-04: 40 meq
  Filled 2019-01-04: qty 30

## 2019-01-04 MED ORDER — FUROSEMIDE 10 MG/ML IJ SOLN
40.0000 mg | Freq: Once | INTRAMUSCULAR | Status: AC
  Administered 2019-01-04: 40 mg via INTRAVENOUS
  Filled 2019-01-04: qty 4

## 2019-01-04 MED ORDER — STERILE WATER FOR INJECTION IJ SOLN
INTRAMUSCULAR | Status: AC
  Administered 2019-01-04: 10 mL
  Filled 2019-01-04: qty 10

## 2019-01-04 MED ORDER — FAMOTIDINE 40 MG/5ML PO SUSR
20.0000 mg | Freq: Two times a day (BID) | ORAL | Status: DC
  Administered 2019-01-04 – 2019-01-16 (×25): 20 mg
  Filled 2019-01-04 (×27): qty 2.5

## 2019-01-04 NOTE — Progress Notes (Signed)
All pt belongings sent home with pt's husband. Including blue rolling duffel, CPAP machine, brown leather purse, and two white belonging bags with cloths, pictures and pillow inside. No personal belongings remain at bedside.

## 2019-01-04 NOTE — Progress Notes (Signed)
Patient belongings given to charge RN to be sent to security: 2-checkbooks 1-master card 2-visa cards 1-state issued drivers' License [id number] dollar bills 6-5 dollar bills 8-dollar bills [place] gift card [employer] cards 1-[employer] card 2-nickels 2-dimes 2 pennies  1- quarter 6-keys 2-epi pens-sent to pharmacy 7-pill bottles-sent to pharmacy 1 phone 1- phone charger

## 2019-01-04 NOTE — Progress Notes (Signed)
NAME:  Kim Harrison, MRN:  [id number], DOB:  02-16-1955, LOS: 76 ADMISSION DATE:  12/13/2018, CONSULTATION DATE: 11/30 REFERRING MD: Dr. Sherral Hammers, CHIEF COMPLAINT: Shortness of breath  Brief History   63 year old female admitted 11/9 with a 10-day history of shortness of breath.  Outpatient testing for Covid was initially negative, then positive on 10/30. As an outpatient she completed 2 rounds of antibiotics and steroids. She presented to [facility] on 11/9 after a syncopal episode and was found to be COVID-19 positive.  After transfer to [facility] the patient was treated with steroids, remdesivir, convalescent plasma and 2 doses of tocilizumab.  She required high flow oxygen.  Follow-up CTA imaging of the chest revealed bilateral groundglass opacities, septal thickening and no PE.  Course notable for worsening on 11/27 with hypotension and worsening of left lower lobe infiltrate.  Cultures were obtained and the patient was treated for suspected superimposed bacterial pneumonia.  On 11/30 she decompensated requiring intubation.   Past Medical History  Sarcoidosis Hypothyroidism Dyslipidemia Anxiety Cervical Cancer   Significant Hospital Events   11/09 Admit 11/14 Transfer to ICU  11/17 Transfer to PCU 11/27 Worsening of hypoxemic respiratory failure 11/29 Transfer back to ICU for worsening hypoxemia  11/30 Decompensation requiring intubation, paralytics 12/01 Prone positioning, acidosis overnight 12/02 New AF, later RVR in pm.  Prone held.  RBBB on EKG. Bedside ECHO w/o RV fx/septal bowing  Consults:    Procedures:  ETT 11/30 >> L IJ TLC 11/30 >>  R Rad Aline 12/1 >>   Significant Diagnostic Tests:   CTA Chest 11/9 >> negative for PE, interlobular septal thickening, bilateral groundglass opacities with lower lobe predominance  CTA Chest 11/14 >> negative for PE, moderate worsening of groundglass opacities and septal thickening since prior exam superimposed on changes of  sarcoidosis  ECHO 11/28 >> LVEF 60 to 65%, LVH, grade 1 diastolic dysfunction, LA/RA normal size, trace MVR, mild TVR, no evidence of left ear, mildly elevated pulmonary artery systolic pressure  ECHO 24/4 >> LVEF 55-60%, normal LV function, no LVH, global RV normal systolic function, RV moderately enlarged, LA normal, RA mildly dilated, trace MVR, mild TVR, PVR trivial, mildly elevated PAS pressure  Micro Data:  SARS CoV2 11/06 >> positive MRSA PCR 11/14 >> negative  BCx2 11/28 >> negative  Antimicrobials:  Remdesivir 11/9 >> 11/14  Convalescent plasma 11/11  Toclizumab 11/11 & 11/13  Aztreonam 11/28 >> 12/4  Interim history/subjective:  Remains on increased PEEP/FiO2.  Prone positioning.  Objective   Blood pressure (!) 112/50, pulse 73, temperature 97.7 F (36.5 C), temperature source Oral, resp. rate (!) 31, height 5' 5"  (1.651 m), weight 80.3 kg, SpO2 95 %. CVP:  [10 mmHg-14 mmHg] 10 mmHg  Vent Mode: PRVC FiO2 (%):  [70 %-90 %] 90 % Set Rate:  [30 bmp] 30 bmp Vt Set:  [340 mL] 340 mL PEEP:  [16 cmH20] 16 cmH20 Plateau Pressure:  [27 cmH20-38 cmH20] 31 cmH20   Intake/Output Summary (Last 24 hours) at 01/04/2019 1048 Last data filed at 01/04/2019 0800 Gross per 24 hour  Intake 1147.64 ml  Output 2850 ml  Net -1702.36 ml   Filed Weights   01/01/19 0444 01/02/19 0359 01/03/19 0443  Weight: 78.7 kg 78.7 kg 80.3 kg    Examination:  General - sedated Eyes - pupils reactive ENT - ETT in place Cardiac - decreased heart sounds, no murmur Chest - b/l rhonchi Abdomen - unable to assess as pt in prone position Extremities - 1+ edema  Skin - no rashes Neuro - RASS -4  CXR (reviewed by me) - 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 - initially treated for COVID pneumonia with improvement;  completed remdesivir, decadron - has hx of sarcoidosis - worsening hypoxia 11/29 and back to ICU requiring intubation likely from HCAP - prone positioning started 12/01; goal PaO2:FiO2 > 150 - f/u CXR, ABG - check ESR, BNP - negative fluid balance as able  PAF. RBBB. - monitor on tele - continue ASA  Acute metabolic encephalopathy. Hx of anxiety. - RASS goal - 4 to -5 - neuromuscular blockade as needed for vent synchrony - continue klonopin, lexapro  Hypokalemia. - f/u BMET  Hypernatremia. - continue free water  Anemia of critical illness. Thrombocytopenia. - f/u CBC - transfuse for Hb < 7 or significant bleeding  Hyperglycemia. Hx of hypothyroidism. - SSI - continue synthroid  Best practice:  Diet: TF DVT prophylaxis: fondaparinux GI prophylaxis: pepcid  Mobility: BR  Code Status: Full Code  Family Communication: Husband Shanon Brow [phone]) Burns [place] (daughter [phone]).  Wetzel Bjornstad (daughter [phone]).  Husband works as a Engineer, drilling and at times has limited cell signal.  Please call Wells Guiles first if he can not be reached.  Disposition: ICU   Labs    CMP Latest Ref Rng & Units 01/04/2019 01/04/2019 01/03/2019  Glucose 70 - 99 mg/dL - 178(H) -  BUN 8 - 23 mg/dL - 44(H) -  Creatinine 0.44 - 1.00 mg/dL - 0.61 -  Sodium 135 - 145 mmol/L 144 151(H) 146(H)  Potassium 3.5 - 5.1 mmol/L 3.4(L) 3.3(L) 3.7  Chloride 98 - 111 mmol/L - 95(L) -  CO2 22 - 32 mmol/L - 47(H) -  Calcium 8.9 - 10.3 mg/dL - 8.7(L) -  Total Protein 6.5 - 8.1 g/dL - - -  Total Bilirubin 0.3 - 1.2 mg/dL - - -  Alkaline Phos 38 - 126 U/L - - -  AST 15 - 41 U/L - - -  ALT 0 - 44 U/L - - -    CBC Latest Ref Rng & Units 01/04/2019 01/04/2019 01/03/2019  WBC 4.0 - 10.5 K/uL - 5.0 -  Hemoglobin 12.0 - 15.0 g/dL 9.5(L) 9.9(L) 9.9(L)  Hematocrit 36.0 - 46.0 % 28.0(L) 33.2(L) 29.0(L)  Platelets 150 - 400 K/uL - 97(L) -    ABG    Component Value Date/Time   PHART 7.383 01/04/2019 0536    PCO2ART 80.5 (HH) 01/04/2019 0536   PO2ART 67.0 (L) 01/04/2019 0536   HCO3 47.9 (H) 01/04/2019 0536   TCO2 >50 (H) 01/04/2019 0536   O2SAT 91.0 01/04/2019 0536    CBG (last 3)  Recent Labs    01/04/19 0359 01/04/19 0748 01/04/19 1116  GLUCAP 183* 157* 125*    CC time 33 minutes  Chesley Mires, MD [place] 01/04/2019, 11:49 AM

## 2019-01-04 NOTE — Progress Notes (Signed)
Pt's head turned at this time w/o complications.

## 2019-01-04 NOTE — Progress Notes (Signed)
Patient's cheeks, chin and forehead padded. ETT re-secured with cloth tape.  Patient placed in prone position by RT x 3 and RN x 3 without complications.

## 2019-01-04 NOTE — Progress Notes (Signed)
[facility] Progress Note Patient Name: Kim Harrison DOB: 1955/11/27 MRN: [id number]   Date of Service  01/04/2019  HPI/Events of Note  Request for diuresis. CVP 18-19 on FiO2 80% and 15 PEEP. Anasarca on bedside exam. (I am unable to camera into her room). She is not on pressors, given Lasix 40 IV this morning with initial 300 cc output. No UO tapering off. On fre water flushes 250 q 4 for hypernatremia.  eICU Interventions  Will give another dose of lasix 40 mg and hopefully improve gas exchange. Discussed with bedside RN over the phone.     Intervention Category Intermediate Interventions: Hypervolemia - evaluation and management  Judd Lien 01/04/2019, 12:42 AM

## 2019-01-04 NOTE — Progress Notes (Signed)
Patient's belongings not able to be placed in the safe in security due to size. Family will be contacted in AM to come get belongings. Day shift Charge RN will be notified.

## 2019-01-04 NOTE — Progress Notes (Signed)
Assisted tele visit to patient with husband and 2 daughters.  Corban Kistler, Canary Brim, RN

## 2019-01-04 NOTE — Progress Notes (Signed)
Pt's head turned at this time w/o complication 

## 2019-01-04 NOTE — Progress Notes (Signed)
Patient placed in supine position by RT x 2 and RN x 4 without complications.  Patient has some skin breakdown on her left cheek and left earlobe.  Patient has a small cut and pressure ulcer on the inside of the upper lip at center left. RN made aware.  Foam padding placed on the left cheek and earlobe before the ETT was re-secured with a commercial tube holder.  ETT moved to the right.

## 2019-01-04 NOTE — Progress Notes (Signed)
Assisted tele visit to patient with family member.  Kathelyn Gombos P, RN  

## 2019-01-05 ENCOUNTER — Inpatient Hospital Stay (HOSPITAL_COMMUNITY): Payer: BC Managed Care – PPO

## 2019-01-05 DIAGNOSIS — J9601 Acute respiratory failure with hypoxia: Secondary | ICD-10-CM | POA: Diagnosis not present

## 2019-01-05 LAB — POCT I-STAT 7, (LYTES, BLD GAS, ICA,H+H)
Acid-Base Excess: 18 mmol/L — ABNORMAL HIGH (ref 0.0–2.0)
Acid-Base Excess: 17 mmol/L — ABNORMAL HIGH (ref 0.0–2.0)
Bicarbonate: 47 mmol/L — ABNORMAL HIGH (ref 20.0–28.0)
Bicarbonate: 45.4 mmol/L — ABNORMAL HIGH (ref 20.0–28.0)
Calcium, Ion: 1.28 mmol/L (ref 1.15–1.40)
Calcium, Ion: 1.25 mmol/L (ref 1.15–1.40)
HCT: 30 % — ABNORMAL LOW (ref 36.0–46.0)
HCT: 29 % — ABNORMAL LOW (ref 36.0–46.0)
Hemoglobin: 10.2 g/dL — ABNORMAL LOW (ref 12.0–15.0)
Hemoglobin: 9.9 g/dL — ABNORMAL LOW (ref 12.0–15.0)
O2 Saturation: 84 %
O2 Saturation: 93 %
Patient temperature: 36.7
Patient temperature: 98.7
Potassium: 4.2 mmol/L (ref 3.5–5.1)
Potassium: 4 mmol/L (ref 3.5–5.1)
Sodium: 141 mmol/L (ref 135–145)
Sodium: 140 mmol/L (ref 135–145)
TCO2: 50 mmol/L — ABNORMAL HIGH (ref 22–32)
TCO2: 48 mmol/L — ABNORMAL HIGH (ref 22–32)
pCO2 arterial: 84.8 mmHg (ref 32.0–48.0)
pCO2 arterial: 80.1 mmHg (ref 32.0–48.0)
pH, Arterial: 7.35 (ref 7.350–7.450)
pH, Arterial: 7.362 (ref 7.350–7.450)
pO2, Arterial: 54 mmHg — ABNORMAL LOW (ref 83.0–108.0)
pO2, Arterial: 76 mmHg — ABNORMAL LOW (ref 83.0–108.0)

## 2019-01-05 LAB — BASIC METABOLIC PANEL
Anion gap: 11 (ref 5–15)
BUN: 36 mg/dL — ABNORMAL HIGH (ref 8–23)
CO2: 43 mmol/L — ABNORMAL HIGH (ref 22–32)
Calcium: 8.8 mg/dL — ABNORMAL LOW (ref 8.9–10.3)
Chloride: 93 mmol/L — ABNORMAL LOW (ref 98–111)
Creatinine, Ser: 0.56 mg/dL (ref 0.44–1.00)
GFR calc Af Amer: >60 mL/min (ref >60)
GFR calc non Af Amer: >60 mL/min (ref >60)
Glucose, Bld: 149 mg/dL — ABNORMAL HIGH (ref 70–99)
Potassium: 4.3 mmol/L (ref 3.5–5.1)
Sodium: 147 mmol/L — ABNORMAL HIGH (ref 135–145)

## 2019-01-05 LAB — CBC
HCT: 34.7 % — ABNORMAL LOW (ref 36.0–46.0)
Hemoglobin: 10.5 g/dL — ABNORMAL LOW (ref 12.0–15.0)
MCH: 31.6 pg (ref 26.0–34.0)
MCHC: 30.3 g/dL (ref 30.0–36.0)
MCV: 104.5 fL — ABNORMAL HIGH (ref 80.0–100.0)
Platelets: 137 10*3/uL — ABNORMAL LOW (ref 150–400)
RBC: 3.32 MIL/uL — ABNORMAL LOW (ref 3.87–5.11)
RDW: 14.8 % (ref 11.5–15.5)
WBC: 7.1 10*3/uL (ref 4.0–10.5)
nRBC: 0.3 % — ABNORMAL HIGH (ref 0.0–0.2)

## 2019-01-05 LAB — GLUCOSE, CAPILLARY
Glucose-Capillary: 127 mg/dL — ABNORMAL HIGH (ref 70–99)
Glucose-Capillary: 130 mg/dL — ABNORMAL HIGH (ref 70–99)
Glucose-Capillary: 131 mg/dL — ABNORMAL HIGH (ref 70–99)
Glucose-Capillary: 135 mg/dL — ABNORMAL HIGH (ref 70–99)
Glucose-Capillary: 143 mg/dL — ABNORMAL HIGH (ref 70–99)
Glucose-Capillary: 158 mg/dL — ABNORMAL HIGH (ref 70–99)

## 2019-01-05 LAB — MAGNESIUM: Magnesium: 2.3 mg/dL (ref 1.7–2.4)

## 2019-01-05 NOTE — Progress Notes (Signed)
Pt's head turned at this time w/o complication 

## 2019-01-05 NOTE — Progress Notes (Signed)
Called patient's husband Shanon Brow and updated about current status. All questions answered. States would like to do video visit around 8pm tonight with his daughters. Will pass on to next shift RN.

## 2019-01-05 NOTE — Progress Notes (Signed)
Spokes with pt's husband.  Updated about current status and treatment plan.  Explained that she essentially has single organ failure related to respiratory failure, and that she is maintaining on current support.  However, she isn't at a point where I would say she is improving.    Chesley Mires, MD [facility] Pulmonary/Critical Care 01/05/2019, 1:59 PM

## 2019-01-05 NOTE — Progress Notes (Signed)
Significant facial swelling.  Will hold off on prone positioning for today.  Chesley Mires, MD [facility] Pulmonary/Critical Care 01/05/2019, 11:25 AM

## 2019-01-05 NOTE — Progress Notes (Signed)
Patient placed in supine position by RT x 2 and RN x 4 without complications at [id number].  ETT re-secured with a commercial tube holder at 25 on the left.  Bite block moved to the right.  Due to facial swelling, CCM agreed to not re-prone the patient today.  We will reassess tomorrow.

## 2019-01-05 NOTE — Progress Notes (Signed)
Assisted tele visit to patient with husband and daughters.  Damin Salido, Canary Brim, RN

## 2019-01-05 NOTE — Progress Notes (Signed)
NAME:  Kim Harrison, MRN:  [id number], DOB:  03/04/1955, LOS: 6 ADMISSION DATE:  12/02/2018, CONSULTATION DATE: 11/30 REFERRING MD: Dr. Sherral Hammers, CHIEF COMPLAINT: Shortness of breath  Brief History   63 year old female admitted 11/9 with a 10-day history of shortness of breath.  Outpatient testing for Covid was initially negative, then positive on 10/30. As an outpatient she completed 2 rounds of antibiotics and steroids. She presented to [facility] on 11/9 after a syncopal episode and was found to be COVID-19 positive.  After transfer to [facility] the patient was treated with steroids, remdesivir, convalescent plasma and 2 doses of tocilizumab.  She required high flow oxygen.  Follow-up CTA imaging of the chest revealed bilateral groundglass opacities, septal thickening and no PE.  Course notable for worsening on 11/27 with hypotension and worsening of left lower lobe infiltrate.  Cultures were obtained and the patient was treated for suspected superimposed bacterial pneumonia.  On 11/30 she decompensated requiring intubation.   Past Medical History  Sarcoidosis Hypothyroidism Dyslipidemia Anxiety Cervical Cancer   Significant Hospital Events   11/09 Admit 11/14 Transfer to ICU  11/17 Transfer to PCU 11/27 Worsening of hypoxemic respiratory failure 11/29 Transfer back to ICU for worsening hypoxemia  11/30 Decompensation requiring intubation, paralytics 12/01 Prone positioning, acidosis overnight 12/02 New AF, later RVR in pm.  Prone held.  RBBB on EKG. Bedside ECHO w/o RV fx/septal bowing  Consults:    Procedures:  ETT 11/30 >> L IJ TLC 11/30 >>  R Rad Aline 12/1 >>   Significant Diagnostic Tests:   CTA Chest 11/9 >> negative for PE, interlobular septal thickening, bilateral groundglass opacities with lower lobe predominance  CTA Chest 11/14 >> negative for PE, moderate worsening of groundglass opacities and septal thickening since prior exam superimposed on changes of  sarcoidosis  ECHO 11/28 >> LVEF 60 to 65%, LVH, grade 1 diastolic dysfunction, LA/RA normal size, trace MVR, mild TVR, no evidence of left ear, mildly elevated pulmonary artery systolic pressure  ECHO 25/3 >> LVEF 55-60%, normal LV function, no LVH, global RV normal systolic function, RV moderately enlarged, LA normal, RA mildly dilated, trace MVR, mild TVR, PVR trivial, mildly elevated PAS pressure  Micro Data:  SARS CoV2 11/06 >> positive MRSA PCR 11/14 >> negative  BCx2 11/28 >> negative  Antimicrobials:  Remdesivir 11/9 >> 11/14  Convalescent plasma 11/11  Toclizumab 11/11 & 11/13  Aztreonam 11/28 >> 12/4  Interim history/subjective:  Prone positioning.  PaO2:FiO2 60.  FiO2 90%, PEEP 16.  Objective   Blood pressure (!) 121/58, pulse 73, temperature 97.7 F (36.5 C), resp. rate (!) 31, height 5' 5"  (1.651 m), weight 80.3 kg, SpO2 95 %. CVP:  [17 mmHg-22 mmHg] 22 mmHg  Vent Mode: PRVC FiO2 (%):  [90 %] 90 % Set Rate:  [30 bmp] 30 bmp Vt Set:  [340 mL] 340 mL PEEP:  [16 cmH20] 16 cmH20 Plateau Pressure:  [26 cmH20-41 cmH20] 33 cmH20   Intake/Output Summary (Last 24 hours) at 01/05/2019 6644 Last data filed at 01/05/2019 0600 Gross per 24 hour  Intake 737.3 ml  Output 1050 ml  Net -312.7 ml   Filed Weights   01/01/19 0444 01/02/19 0359 01/03/19 0443  Weight: 78.7 kg 78.7 kg 80.3 kg    Examination:  General - sedated Eyes - pupils reactive ENT - ETT in place Cardiac - regular rate/rhythm, no murmur Chest - b/l crackles with rhonchi Abdomen - + bowel sounds Extremities - 1+ edema Skin - pressure sores on  face Neuro - RASS -4  CXR (reviewed by me) - b/l ASD with increase on Lt > [facility] Problem list    Elevated troponin from demand ischemia (preserved ER on Echo), Septic shock from HCAP, Elevated LFTs from shock  Assessment & Plan:   Acute hypoxic, hypercapnic respiratory failure with ARDS. - initially treated for COVID pneumonia with  improvement; completed remdesivir, decadron - has hx of sarcoidosis - worsening hypoxia 11/29 and back to ICU requiring intubation likely from HCAP - BNP, ESR in normal range from 12/05 - started prone positioning 12/01; goal PaO2:FiO2 > 150 - f/u CXR, ABG  PAF. RBBB. - monitor on tele - continue ASA - reported allergy to lovenox; continue fondaprinux  Acute metabolic encephalopathy. Hx of anxiety. - RASS goal -4 to -5 - neuromuscular blockade as needed for vent synchrony - continue klonopin, lexapro  Hypernatremia. - continue free water  Anemia of critical illness. Thrombocytopenia. - f/u CBC - transfuse for Hb < 7 or significant bleeding  Hyperglycemia. Hx of hypothyroidism. - SSI - continue synthroid  Best practice:  Diet: TF DVT prophylaxis: fondaparinux GI prophylaxis: pepcid  Mobility: BR  Code Status: Full Code  Family Communication: Husband Shanon Brow [phone]) Burns [place] (daughter [phone]).  Wetzel Bjornstad (daughter [phone]).  Husband works as a Engineer, drilling and at times has limited cell signal.  Please call Wells Guiles first if he can not be reached.  Disposition: ICU   Labs    CMP Latest Ref Rng & Units 01/05/2019 01/05/2019 01/04/2019  Glucose 70 - 99 mg/dL 149(H) - -  BUN 8 - 23 mg/dL 36(H) - -  Creatinine 0.44 - 1.00 mg/dL 0.56 - -  Sodium 135 - 145 mmol/L 147(H) 141 144  Potassium 3.5 - 5.1 mmol/L 4.3 4.2 3.5  Chloride 98 - 111 mmol/L 93(L) - -  CO2 22 - 32 mmol/L 43(H) - -  Calcium 8.9 - 10.3 mg/dL 8.8(L) - -  Total Protein 6.5 - 8.1 g/dL - - -  Total Bilirubin 0.3 - 1.2 mg/dL - - -  Alkaline Phos 38 - 126 U/L - - -  AST 15 - 41 U/L - - -  ALT 0 - 44 U/L - - -    CBC Latest Ref Rng & Units 01/05/2019 01/05/2019 01/04/2019  WBC 4.0 - 10.5 K/uL 7.1 - -  Hemoglobin 12.0 - 15.0 g/dL 10.5(L) 10.2(L) 9.9(L)  Hematocrit 36.0 - 46.0 % 34.7(L) 30.0(L) 29.0(L)  Platelets 150 - 400 K/uL 137(L) - -    ABG    Component Value Date/Time   PHART  7.350 01/05/2019 0414   PCO2ART 84.8 (HH) 01/05/2019 0414   PO2ART 54.0 (L) 01/05/2019 0414   HCO3 47.0 (H) 01/05/2019 0414   TCO2 50 (H) 01/05/2019 0414   O2SAT 84.0 01/05/2019 0414    CBG (last 3)  Recent Labs    01/04/19 2332 01/05/19 0425 01/05/19 0747  GLUCAP 158* 130* 143*    CC time 32 minutes  Chesley Mires, MD [facility] Pulmonary/Critical Care 01/05/2019, 9:07 AM

## 2019-01-06 ENCOUNTER — Inpatient Hospital Stay (HOSPITAL_COMMUNITY): Payer: BC Managed Care – PPO

## 2019-01-06 DIAGNOSIS — J9601 Acute respiratory failure with hypoxia: Secondary | ICD-10-CM | POA: Diagnosis not present

## 2019-01-06 DIAGNOSIS — I9589 Other hypotension: Secondary | ICD-10-CM | POA: Diagnosis not present

## 2019-01-06 DIAGNOSIS — F419 Anxiety disorder, unspecified: Secondary | ICD-10-CM | POA: Diagnosis not present

## 2019-01-06 DIAGNOSIS — U071 COVID-19: Secondary | ICD-10-CM | POA: Diagnosis not present

## 2019-01-06 LAB — POCT I-STAT 7, (LYTES, BLD GAS, ICA,H+H)
Acid-Base Excess: 17 mmol/L — ABNORMAL HIGH (ref 0.0–2.0)
Bicarbonate: 46.2 mmol/L — ABNORMAL HIGH (ref 20.0–28.0)
Calcium, Ion: 1.26 mmol/L (ref 1.15–1.40)
HCT: 30 % — ABNORMAL LOW (ref 36.0–46.0)
Hemoglobin: 10.2 g/dL — ABNORMAL LOW (ref 12.0–15.0)
O2 Saturation: 90 %
Patient temperature: 97.7
Potassium: 4.1 mmol/L (ref 3.5–5.1)
Sodium: 139 mmol/L (ref 135–145)
TCO2: 49 mmol/L — ABNORMAL HIGH (ref 22–32)
pCO2 arterial: 84.7 mmHg (ref 32.0–48.0)
pH, Arterial: 7.342 — ABNORMAL LOW (ref 7.350–7.450)
pO2, Arterial: 65 mmHg — ABNORMAL LOW (ref 83.0–108.0)

## 2019-01-06 LAB — CBC
HCT: 34 % — ABNORMAL LOW (ref 36.0–46.0)
Hemoglobin: 10.3 g/dL — ABNORMAL LOW (ref 12.0–15.0)
MCH: 31.7 pg (ref 26.0–34.0)
MCHC: 30.3 g/dL (ref 30.0–36.0)
MCV: 104.6 fL — ABNORMAL HIGH (ref 80.0–100.0)
Platelets: 151 10*3/uL (ref 150–400)
RBC: 3.25 MIL/uL — ABNORMAL LOW (ref 3.87–5.11)
RDW: 15.3 % (ref 11.5–15.5)
WBC: 7.2 10*3/uL (ref 4.0–10.5)
nRBC: 0 % (ref 0.0–0.2)

## 2019-01-06 LAB — BASIC METABOLIC PANEL
Anion gap: 9 (ref 5–15)
BUN: 28 mg/dL — ABNORMAL HIGH (ref 8–23)
CO2: 43 mmol/L — ABNORMAL HIGH (ref 22–32)
Calcium: 8.7 mg/dL — ABNORMAL LOW (ref 8.9–10.3)
Chloride: 92 mmol/L — ABNORMAL LOW (ref 98–111)
Creatinine, Ser: 0.43 mg/dL — ABNORMAL LOW (ref 0.44–1.00)
GFR calc Af Amer: >60 mL/min (ref >60)
GFR calc non Af Amer: >60 mL/min (ref >60)
Glucose, Bld: 148 mg/dL — ABNORMAL HIGH (ref 70–99)
Potassium: 4.1 mmol/L (ref 3.5–5.1)
Sodium: 144 mmol/L (ref 135–145)

## 2019-01-06 LAB — GLUCOSE, CAPILLARY
Glucose-Capillary: 100 mg/dL — ABNORMAL HIGH (ref 70–99)
Glucose-Capillary: 118 mg/dL — ABNORMAL HIGH (ref 70–99)
Glucose-Capillary: 120 mg/dL — ABNORMAL HIGH (ref 70–99)
Glucose-Capillary: 128 mg/dL — ABNORMAL HIGH (ref 70–99)
Glucose-Capillary: 140 mg/dL — ABNORMAL HIGH (ref 70–99)
Glucose-Capillary: 154 mg/dL — ABNORMAL HIGH (ref 70–99)
Glucose-Capillary: 158 mg/dL — ABNORMAL HIGH (ref 70–99)

## 2019-01-06 MED ORDER — FUROSEMIDE 10 MG/ML IJ SOLN
40.0000 mg | Freq: Two times a day (BID) | INTRAMUSCULAR | Status: AC
  Administered 2019-01-06 – 2019-01-07 (×3): 40 mg via INTRAVENOUS
  Filled 2019-01-06 (×3): qty 4

## 2019-01-06 MED ORDER — POTASSIUM CHLORIDE 20 MEQ/15ML (10%) PO SOLN
40.0000 meq | Freq: Every day | ORAL | Status: AC
  Administered 2019-01-06 – 2019-01-07 (×2): 40 meq
  Filled 2019-01-06 (×2): qty 30

## 2019-01-06 MED ORDER — VASOPRESSIN 20 UNIT/ML IV SOLN
0.0300 [IU]/min | INTRAVENOUS | Status: DC
  Administered 2019-01-06 – 2019-01-11 (×6): 0.03 [IU]/min via INTRAVENOUS
  Filled 2019-01-06 (×9): qty 2

## 2019-01-06 NOTE — Progress Notes (Addendum)
NAME:  Kim Harrison, MRN:  [id number], DOB:  1955/12/23, LOS: 18 ADMISSION DATE:  12/22/2018, CONSULTATION DATE: 11/30 REFERRING MD: Dr. Sherral Hammers, CHIEF COMPLAINT: Shortness of breath  Brief History   63 year old female admitted 11/9 with a 10-day history of shortness of breath.  Outpatient testing for Covid was initially negative, then positive on 10/30. As an outpatient she completed 2 rounds of antibiotics and steroids. She presented to [facility] on 11/9 after a syncopal episode and was found to be COVID-19 positive.  After transfer to [facility] the patient was treated with steroids, remdesivir, convalescent plasma and 2 doses of tocilizumab.  She required high flow oxygen.  Follow-up CTA imaging of the chest revealed bilateral groundglass opacities, septal thickening and no PE.  Course notable for worsening on 11/27 with hypotension and worsening of left lower lobe infiltrate.  Cultures were obtained and the patient was treated for suspected superimposed bacterial pneumonia.  On 11/30 she decompensated requiring intubation.   Past Medical History  Sarcoidosis Hypothyroidism Dyslipidemia Anxiety Cervical Cancer   Significant Hospital Events   11/09 Admit 11/14 Transfer to ICU  11/17 Transfer to PCU 11/27 Worsening of hypoxemic respiratory failure 11/29 Transfer back to ICU for worsening hypoxemia  11/30 Decompensation requiring intubation, paralytics 12/01 Prone positioning, acidosis overnight 12/02 New AF, later RVR in pm.  Prone held.  RBBB on EKG. Bedside ECHO w/o RV fx/septal bowing 12/04 Prone positioning 12/06 Prone held due to facial swelling   Consults:    Procedures:  ETT 11/30 >> L IJ TLC 11/30 >>  R Rad Aline 12/1 >>   Significant Diagnostic Tests:   CTA Chest 11/9 >> negative for PE, interlobular septal thickening, bilateral groundglass opacities with lower lobe predominance  CTA Chest 11/14 >> negative for PE, moderate worsening of groundglass opacities and  septal thickening since prior exam superimposed on changes of sarcoidosis  ECHO 11/28 >> LVEF 60 to 65%, LVH, grade 1 diastolic dysfunction, LA/RA normal size, trace MVR, mild TVR, no evidence of left ear, mildly elevated pulmonary artery systolic pressure  ECHO [id number] >> LVEF 55-60%, normal LV function, no LVH, global RV normal systolic function, RV moderately enlarged, LA normal, RA mildly dilated, trace MVR, mild TVR, PVR trivial, mildly elevated PAS pressure  Micro Data:  SARS CoV2 11/06 >> positive MRSA PCR 11/14 >> negative  BCx2 11/28 >> negative  Antimicrobials:  Remdesivir 11/9 >> 11/14  Convalescent plasma 11/11  Toclizumab 11/11 & 11/13  Aztreonam 11/28 >> 12/4  Interim history/subjective:  Afebrile.  I/O- UOP 995 in 24h, net -157ml.  Prone positioning held due to significant facial swelling. Peak 34, plat 34 FiO2 100, peep 16  Objective   Blood pressure (!) 87/48, pulse 77, temperature 97.9 F (36.6 C), temperature source Oral, resp. rate (!) 24, height 5\' 5"  (1.651 m), weight 80.3 kg, SpO2 (!) 89 %. CVP:  [18 mmHg] 18 mmHg  Vent Mode: PRVC FiO2 (%):  [85 %-90 %] 85 % Set Rate:  [30 bmp] 30 bmp Vt Set:  [340 mL] 340 mL PEEP:  [16 cmH20] 16 cmH20 Plateau Pressure:  [25 cmH20-33 cmH20] 25 cmH20   Intake/Output Summary (Last 24 hours) at 01/06/2019 0819 Last data filed at 01/06/2019 0631 Gross per 24 hour  Intake 821.68 ml  Output 845 ml  Net -23.32 ml   Filed Weights   01/01/19 0444 01/02/19 0359 01/03/19 0443  Weight: 78.7 kg 78.7 kg 80.3 kg    Examination: General: critically ill appearing adult female lying in  bed on vent in NAD HEENT: MM pink/moist, ETT, anicteric, scleral edema Neuro: sedate CV: s1s2 rrr, no m/r/g PULM:  Even/non-labored, synchronous, clear anterior, diminished bases GI: soft, bsx4 active  Extremities: warm/dry, generalized 2-3+ pitting edema  Skin: no rashes or lesions  CXR 12/7 - images personally reviewed, diffuse bilateral  interstitial infiltrates, ETT, central line in good position  Resolved Hospital Problem list   Elevated troponin from demand ischemia (preserved ER on Echo), Septic shock from HCAP, Elevated LFTs from shock  Assessment & Plan:   Acute hypoxic, hypercapnic respiratory failure with ARDS Initially treated for COVID PNA with improvement; completed remesivir, decadron.  Hx Sarcoidosis. Worsening hypoxia 11/29, tx back to ICU with intubation from probable HCAP.  -low Vt ventilation 4-8cc/kg -goal plateau pressure <30, driving pressure [id number] cm H2O -target PaO2 55-65, titrate PEEP/FiO2 per ARDS protocol  -hold prone positioning due to facial edema -goal CVP <4, diuresis as necessary -VAP prevention measures  -follow intermittent CXR, ABG  PAF RBBB -tele monitoring  -continue ASA -reported allergy to lovenox, continue fondaprinux   Acute metabolic encephalopathy Hx of anxiety -RASS goal -4 to -5 with PRN NMB -continue lexapro, klonopin  Hypernatremia -continue free water   Anemia of critical illness Thrombocytopenia -trend CBC -continue folate, thiamine, B12 -transfuse for Hgb <7% or active bleeding   Hyperglycemia Hx of hypothyroidism -SSI  -continue synthroid   Best practice:  Diet: TF DVT prophylaxis: fondaparinux GI prophylaxis: pepcid  Mobility: BR  Code Status: Full Code  Family Communication: Husband Shanon Brow [phone]) updated 12/7 via phone Burns Spain (daughter [phone]).  Wetzel Bjornstad (daughter [phone]).  Husband works as a Engineer, drilling and at times has limited cell signal.  Please call Wells Guiles first if he can not be reached.  Disposition: ICU   Labs    CMP Latest Ref Rng & Units 01/06/2019 01/06/2019 01/05/2019  Glucose 70 - 99 mg/dL 148(H) - -  BUN 8 - 23 mg/dL 28(H) - -  Creatinine 0.44 - 1.00 mg/dL 0.43(L) - -  Sodium 135 - 145 mmol/L 144 139 140  Potassium 3.5 - 5.1 mmol/L 4.1 4.1 4.0  Chloride 98 - 111 mmol/L 92(L) - -  CO2 22 - 32 mmol/L 43(H)  - -  Calcium 8.9 - 10.3 mg/dL 8.7(L) - -  Total Protein 6.5 - 8.1 g/dL - - -  Total Bilirubin 0.3 - 1.2 mg/dL - - -  Alkaline Phos 38 - 126 U/L - - -  AST 15 - 41 U/L - - -  ALT 0 - 44 U/L - - -    CBC Latest Ref Rng & Units 01/06/2019 01/06/2019 01/05/2019  WBC 4.0 - 10.5 K/uL 7.2 - -  Hemoglobin 12.0 - 15.0 g/dL 10.3(L) 10.2(L) 9.9(L)  Hematocrit 36.0 - 46.0 % 34.0(L) 30.0(L) 29.0(L)  Platelets 150 - 400 K/uL 151 - -    ABG    Component Value Date/Time   PHART 7.342 (L) 01/06/2019 0336   PCO2ART 84.7 (HH) 01/06/2019 0336   PO2ART 65.0 (L) 01/06/2019 0336   HCO3 46.2 (H) 01/06/2019 0336   TCO2 49 (H) 01/06/2019 0336   O2SAT 90.0 01/06/2019 0336    CBG (last 3)  Recent Labs    01/05/19 1611 01/05/19 2338 01/06/19 0436  GLUCAP 135* 127* 154*    CC Time: 32 minutes  Noe Gens, MSN, NP-C [facility] Pulmonary & Critical Care 01/06/2019, 8:19 AM   Please see [email or web address] for pager details.

## 2019-01-06 NOTE — Progress Notes (Signed)
ICU CN spoke with Kim Harrison's spouse to ensure that he had been adequately updated by our staff and felt comfortable with the plan of care. He expressed thanks to the staff and confidence in our team. All questions welcomed and answered. Nursing team will continue to update family.

## 2019-01-07 DIAGNOSIS — J9601 Acute respiratory failure with hypoxia: Secondary | ICD-10-CM | POA: Diagnosis not present

## 2019-01-07 DIAGNOSIS — J069 Acute upper respiratory infection, unspecified: Secondary | ICD-10-CM | POA: Diagnosis not present

## 2019-01-07 DIAGNOSIS — U071 COVID-19: Secondary | ICD-10-CM | POA: Diagnosis not present

## 2019-01-07 LAB — BASIC METABOLIC PANEL
Anion gap: 7 (ref 5–15)
BUN: 23 mg/dL (ref 8–23)
CO2: 45 mmol/L — ABNORMAL HIGH (ref 22–32)
Calcium: 8.3 mg/dL — ABNORMAL LOW (ref 8.9–10.3)
Chloride: 89 mmol/L — ABNORMAL LOW (ref 98–111)
Creatinine, Ser: 0.53 mg/dL (ref 0.44–1.00)
GFR calc Af Amer: >60 mL/min (ref >60)
GFR calc non Af Amer: >60 mL/min (ref >60)
Glucose, Bld: 165 mg/dL — ABNORMAL HIGH (ref 70–99)
Potassium: 4 mmol/L (ref 3.5–5.1)
Sodium: 141 mmol/L (ref 135–145)

## 2019-01-07 LAB — CBC
HCT: 33.9 % — ABNORMAL LOW (ref 36.0–46.0)
Hemoglobin: 10.6 g/dL — ABNORMAL LOW (ref 12.0–15.0)
MCH: 32.1 pg (ref 26.0–34.0)
MCHC: 31.3 g/dL (ref 30.0–36.0)
MCV: 102.7 fL — ABNORMAL HIGH (ref 80.0–100.0)
Platelets: 211 10*3/uL (ref 150–400)
RBC: 3.3 MIL/uL — ABNORMAL LOW (ref 3.87–5.11)
RDW: 15.3 % (ref 11.5–15.5)
WBC: 8.2 10*3/uL (ref 4.0–10.5)
nRBC: 0 % (ref 0.0–0.2)

## 2019-01-07 LAB — GLUCOSE, CAPILLARY
Glucose-Capillary: 155 mg/dL — ABNORMAL HIGH (ref 70–99)
Glucose-Capillary: 156 mg/dL — ABNORMAL HIGH (ref 70–99)
Glucose-Capillary: 219 mg/dL — ABNORMAL HIGH (ref 70–99)
Glucose-Capillary: 213 mg/dL — ABNORMAL HIGH (ref 70–99)
Glucose-Capillary: 152 mg/dL — ABNORMAL HIGH (ref 70–99)
Glucose-Capillary: 157 mg/dL — ABNORMAL HIGH (ref 70–99)

## 2019-01-07 MED ORDER — NOREPINEPHRINE 16 MG/250ML-% IV SOLN
0.0000 ug/min | INTRAVENOUS | Status: DC
  Administered 2019-01-07: 10:00:00 8 ug/min via INTRAVENOUS
  Administered 2019-01-09: 3 ug/min via INTRAVENOUS
  Filled 2019-01-07 (×3): qty 250

## 2019-01-07 MED ORDER — HYDROMORPHONE BOLUS VIA INFUSION
0.5000 mg | INTRAVENOUS | Status: DC | PRN
  Administered 2019-01-10: 01:00:00 0.5 mg via INTRAVENOUS
  Filled 2019-01-07: qty 1

## 2019-01-07 MED ORDER — HYDROMORPHONE HCL 1 MG/ML IJ SOLN
1.0000 mg | Freq: Once | INTRAMUSCULAR | Status: AC
  Administered 2019-01-07: 1 mg via INTRAVENOUS
  Filled 2019-01-07 (×2): qty 1

## 2019-01-07 MED ORDER — ACETAZOLAMIDE SODIUM 500 MG IJ SOLR
500.0000 mg | Freq: Once | INTRAMUSCULAR | Status: AC
  Administered 2019-01-07: 500 mg via INTRAVENOUS
  Filled 2019-01-07: qty 500

## 2019-01-07 MED ORDER — SODIUM CHLORIDE 0.9 % IV SOLN
0.5000 mg/h | INTRAVENOUS | Status: DC
  Administered 2019-01-07: 0.5 mg/h via INTRAVENOUS
  Administered 2019-01-09: 1 mg/h via INTRAVENOUS
  Filled 2019-01-07 (×2): qty 5

## 2019-01-07 MED ORDER — FREE WATER
250.0000 mL | Freq: Four times a day (QID) | Status: DC
  Administered 2019-01-07 – 2019-01-08 (×4): 250 mL

## 2019-01-07 MED ORDER — STERILE WATER FOR INJECTION IJ SOLN
INTRAMUSCULAR | Status: AC
  Administered 2019-01-07: 10 mL
  Filled 2019-01-07: qty 10

## 2019-01-07 MED ORDER — PRO-STAT SUGAR FREE PO LIQD
60.0000 mL | Freq: Three times a day (TID) | ORAL | Status: DC
  Administered 2019-01-07 – 2019-01-16 (×28): 60 mL
  Filled 2019-01-07 (×27): qty 60

## 2019-01-07 NOTE — Progress Notes (Signed)
Nutrition Follow-up   DOCUMENTATION CODES:   Not applicable  INTERVENTION:    Continue Vital 1.5 at 40 ml/h (960 ml per day) via Cortrak tube   Increase Pro-stat to 60 ml TID   Provides 2040 kcal, 154 gm protein, 733 ml free water daily  NUTRITION DIAGNOSIS:   Increased nutrient needs related to acute illness(COVID 19 PNA) as evidenced by estimated needs.  Ongoing   GOAL:   Patient will meet greater than or equal to 90% of their needs  Progressing  MONITOR:   Vent status, Labs, TF tolerance, I & O's  REASON FOR ASSESSMENT:   Ventilator, Consult Enteral/tube feeding initiation and management  ASSESSMENT:   63 yo female admitted with fever, chills, COVID 19 PNA (tested positive on 11/6). PMH includes sarcoidosis, cervical cancer, hypothyroidism, HLD.  Per MD may resume prone positioning 12/9, paralytic PRN  Patient with severe ARDS related to COVID PNA; she decompensated and required intubation on 11/30.  12/6 prone stopped due to significant facial swelling  Patient is currently intubated on ventilator support MV: 11 L/min Temp (24hrs), Avg:99.4 F (37.4 C), Min:98.9 F (37.2 C), Max:99.7 F (37.6 C)   Labs reviewed.  CBG's: [phone]  Medications reviewed and include vitamin D3, senokot, colace, folic acid, MVI, miralax, thiamine, vitamin B 12 Levo @ 6  Vasopressin .03 units  250 ml free water every 6 hours = 1000 ml  UOP: 3855 ml I&O: +2.7 L Moderate edema  Admission weight 78.2 kg Current weight 68.4 kg   TF: vital 1.5 @ 40 ml/hr with 30 ml prostat TID Provides: 1740 kcal and 110 grams protein  Diet Order:   Diet Order    None      EDUCATION NEEDS:   Not appropriate for education at this time  Skin:  Skin Assessment: Reviewed RN Assessment  Last BM:  12/5  Height:   Ht Readings from Last 1 Encounters:  01/01/19 5\' 5"  (1.651 m)    Weight:   Wt Readings from Last 1 Encounters:  01/07/19 78.2 kg    Ideal Body  Weight:  56.8 kg  BMI:  Body mass index is 28.69 kg/m.  Estimated Nutritional Needs:   Kcal:  1900-2200  Protein:  100-115 gm  Fluid:  >/= 1.8 L  Maylon Peppers RD, LDN, CNSC [phone] Pager [phone] After Hours Pager

## 2019-01-07 NOTE — Progress Notes (Signed)
240 cc's Fentanyl gtt wasted in stericycle. Devoria Glassing, RN witnessed.

## 2019-01-07 NOTE — Progress Notes (Signed)
Call made to pt's husband. Update given on pt condition and plan of care. Questions answered.

## 2019-01-07 NOTE — Progress Notes (Signed)
Assisted tele visit to patient with husband and daughters.  Kim Harrison, Canary Brim, RN

## 2019-01-07 NOTE — Progress Notes (Signed)
NAME:  Kim Harrison, MRN:  [id number], DOB:  [birth year], LOS: 65 ADMISSION DATE:  12/23/2018, CONSULTATION DATE: 11/30 REFERRING MD: Dr. Sherral Hammers, CHIEF COMPLAINT: Shortness of breath  Brief History   63 year old female admitted 11/9 with a 10-day history of shortness of breath.  Outpatient testing for Covid was initially negative, then positive on 10/30. As an outpatient she completed 2 rounds of antibiotics and steroids. She presented to [facility] on 11/9 after a syncopal episode and was found to be COVID-19 positive.  After transfer to [facility] the patient was treated with steroids, remdesivir, convalescent plasma and 2 doses of tocilizumab.  She required high flow oxygen.  Follow-up CTA imaging of the chest revealed bilateral groundglass opacities, septal thickening and no PE.  Course notable for worsening on 11/27 with hypotension and worsening of left lower lobe infiltrate.  Cultures were obtained and the patient was treated for suspected superimposed bacterial pneumonia.  On 11/30 she decompensated requiring intubation.   Past Medical History  Sarcoidosis Hypothyroidism Dyslipidemia Anxiety Cervical Cancer   Significant Hospital Events   11/09 Admit 11/14 Transfer to ICU  11/17 Transfer to PCU 11/27 Worsening of hypoxemic respiratory failure 11/29 Transfer back to ICU for worsening hypoxemia  11/30 Decompensation requiring intubation, paralytics 12/01 Prone positioning, acidosis overnight 12/02 New AF, later RVR in pm.  Prone held.  RBBB on EKG. Bedside ECHO w/o RV fx/septal bowing 12/04 Prone positioning 12/06 Prone held due to facial swelling  12/07 Prone positioning held due to significant facial swelling. Peak 34, plat 34 FiO2 100, peep 16  Consults:    Procedures:  ETT 11/30 >> L IJ TLC 11/30 >>  R Rad Aline 12/1 >>   Significant Diagnostic Tests:   CTA Chest 11/9 >> negative for PE, interlobular septal thickening, bilateral groundglass opacities with lower  lobe predominance  CTA Chest 11/14 >> negative for PE, moderate worsening of groundglass opacities and septal thickening since prior exam superimposed on changes of sarcoidosis  ECHO 11/28 >> LVEF 60 to 65%, LVH, grade 1 diastolic dysfunction, LA/RA normal size, trace MVR, mild TVR, no evidence of left ear, mildly elevated pulmonary artery systolic pressure  ECHO 99991111 >> LVEF 55-60%, normal LV function, no LVH, global RV normal systolic function, RV moderately enlarged, LA normal, RA mildly dilated, trace MVR, mild TVR, PVR trivial, mildly elevated PAS pressure  Micro Data:  SARS CoV2 11/06 >> positive MRSA PCR 11/14 >> negative  BCx2 11/28 >> negative  Antimicrobials:  Remdesivir 11/9 >> 11/14  Convalescent plasma 11/11  Toclizumab 11/11 & 11/13  Aztreonam 11/28 >> 12/4  Interim history/subjective:  Tmax 99.1.  I/O - post diuresis 3.8L UOP in 24h, net neg 836 for 24h. RN reports increased fentanyl / versed gtt's overnight.  FiO2 increased to 100%/PEEP 16.   Objective   Blood pressure (!) 119/57, pulse 82, temperature 99.1 F (37.3 C), temperature source Oral, resp. rate (!) 28, height 5\' 5"  (1.651 m), weight 78.2 kg, SpO2 95 %. CVP:  [10 mmHg-13 mmHg] 13 mmHg  Vent Mode: PRVC FiO2 (%):  [95 %-100 %] 95 % Set Rate:  [30 bmp] 30 bmp Vt Set:  [340 mL] 340 mL PEEP:  [16 cmH20] 16 cmH20 Plateau Pressure:  [29 cmH20-37 cmH20] 30 cmH20   Intake/Output Summary (Last 24 hours) at 01/07/2019 0819 Last data filed at 01/07/2019 0700 Gross per 24 hour  Intake 2870.33 ml  Output 3765 ml  Net -894.67 ml   Filed Weights   01/02/19 0359 01/03/19 0443  01/07/19 0500  Weight: 78.7 kg 80.3 kg 78.2 kg    Examination: General: critically ill appearing adult female lying in bed in NAD on vent HEENT: MM pink/moist, ETT, mild scleral edema, pupils 26mm reactive, anicteric Neuro: sedate CV: s1s2 rrr, no m/r/g PULM:  Non-labored, distant breath sounds but clear anterior GI: soft, bsx4 active   Extremities: warm/dry, generalized 2-3+ pitting edema  Skin: no rashes or lesions  Resolved Hospital Problem list   Elevated troponin from demand ischemia (preserved ER on Echo), Septic shock from HCAP, Elevated LFTs from shock  Assessment & Plan:   Acute hypoxic, hypercapnic respiratory failure with ARDS Initially treated for COVID PNA with improvement; completed remesivir, decadron.  Hx Sarcoidosis. Worsening hypoxia 11/29, tx back to ICU with intubation from probable HCAP.   PEEP 16/100%, PAP 31, Pplat 30, Driving Pressure 14 -low Vt ventilation 4-8cc/kg -goal plateau pressure <30, driving pressure [id number] cm H2O -target PaO2 55-65, titrate PEEP/FiO2 per ARDS protocol  -hold prone therapy for now due to facial swelling > consider restart 12/9 -goal CVP <4, diuresis as necessary -VAP prevention measures  -follow intermittent CXR, ABG -reviewed concept of tracheostomy with husband, current O2 needs / process for weaning  PAF RBBB -tele monitoring  -continue ASA -continue fondaparinux, reported allergy to lovenox  Acute metabolic encephalopathy Hx of anxiety -RASS goal -4 to -5 with PRN NMB -change fentanyl to dilaudid gtt -continue klonopin, lexapro  Hypernatremia -continue free water   Macrocytic Anemia  Thrombocytopenia -trend CBC -transfuse for Hgb <7%, or active bleeding  -folate, B12, thiamine  Hyperglycemia Hx of hypothyroidism -SSI  -continue synthroid   Best practice:  Diet: TF DVT prophylaxis: fondaparinux GI prophylaxis: pepcid  Mobility: BR  Code Status: Full Code  Family Communication: Husband Shanon Brow [phone]) updated 12/8 via phone. He requests Dr. Lake Bells update on 12/9 if possible.  Burns Spain (daughter [phone]).  Wetzel Bjornstad (daughter [phone]).  Husband works as a Engineer, drilling and at times has limited cell signal.  Please call Wells Guiles first if he can not be reached.  Disposition: ICU   Labs    CMP Latest Ref Rng & Units 01/07/2019  01/06/2019 01/06/2019  Glucose 70 - 99 mg/dL 165(H) 148(H) -  BUN 8 - 23 mg/dL 23 28(H) -  Creatinine 0.44 - 1.00 mg/dL 0.53 0.43(L) -  Sodium 135 - 145 mmol/L 141 144 139  Potassium 3.5 - 5.1 mmol/L 4.0 4.1 4.1  Chloride 98 - 111 mmol/L 89(L) 92(L) -  CO2 22 - 32 mmol/L 45(H) 43(H) -  Calcium 8.9 - 10.3 mg/dL 8.3(L) 8.7(L) -  Total Protein 6.5 - 8.1 g/dL - - -  Total Bilirubin 0.3 - 1.2 mg/dL - - -  Alkaline Phos 38 - 126 U/L - - -  AST 15 - 41 U/L - - -  ALT 0 - 44 U/L - - -    CBC Latest Ref Rng & Units 01/07/2019 01/06/2019 01/06/2019  WBC 4.0 - 10.5 K/uL 8.2 7.2 -  Hemoglobin 12.0 - 15.0 g/dL 10.6(L) 10.3(L) 10.2(L)  Hematocrit 36.0 - 46.0 % 33.9(L) 34.0(L) 30.0(L)  Platelets 150 - 400 K/uL 211 151 -    ABG    Component Value Date/Time   PHART 7.342 (L) 01/06/2019 0336   PCO2ART 84.7 (HH) 01/06/2019 0336   PO2ART 65.0 (L) 01/06/2019 0336   HCO3 46.2 (H) 01/06/2019 0336   TCO2 49 (H) 01/06/2019 0336   O2SAT 90.0 01/06/2019 0336    CBG (last 3)  Recent Labs  01/06/19 2022 01/06/19 2330 01/07/19 0428  GLUCAP 118* 140* 155*    CC Time: 30 minutes  Noe Gens, MSN, NP-C [facility] 01/07/2019, 8:19 AM   Please see [email or web address] for pager details.

## 2019-01-08 ENCOUNTER — Inpatient Hospital Stay (HOSPITAL_COMMUNITY): Payer: BC Managed Care – PPO

## 2019-01-08 LAB — POCT I-STAT 7, (LYTES, BLD GAS, ICA,H+H)
Acid-Base Excess: 11 mmol/L — ABNORMAL HIGH (ref 0.0–2.0)
Bicarbonate: 39.1 mmol/L — ABNORMAL HIGH (ref 20.0–28.0)
Calcium, Ion: 1.24 mmol/L (ref 1.15–1.40)
HCT: 29 % — ABNORMAL LOW (ref 36.0–46.0)
Hemoglobin: 9.9 g/dL — ABNORMAL LOW (ref 12.0–15.0)
O2 Saturation: 92 %
Patient temperature: 99.9
Potassium: 3.5 mmol/L (ref 3.5–5.1)
Sodium: 131 mmol/L — ABNORMAL LOW (ref 135–145)
TCO2: 41 mmol/L — ABNORMAL HIGH (ref 22–32)
pCO2 arterial: 79.4 mmHg (ref 32.0–48.0)
pH, Arterial: 7.304 — ABNORMAL LOW (ref 7.350–7.450)
pO2, Arterial: 76 mmHg — ABNORMAL LOW (ref 83.0–108.0)

## 2019-01-08 LAB — BASIC METABOLIC PANEL
Anion gap: 7 (ref 5–15)
BUN: 22 mg/dL (ref 8–23)
CO2: 38 mmol/L — ABNORMAL HIGH (ref 22–32)
Calcium: 8.5 mg/dL — ABNORMAL LOW (ref 8.9–10.3)
Chloride: 89 mmol/L — ABNORMAL LOW (ref 98–111)
Creatinine, Ser: 0.53 mg/dL (ref 0.44–1.00)
GFR calc Af Amer: >60 mL/min (ref >60)
GFR calc non Af Amer: >60 mL/min (ref >60)
Glucose, Bld: 114 mg/dL — ABNORMAL HIGH (ref 70–99)
Potassium: 3.7 mmol/L (ref 3.5–5.1)
Sodium: 134 mmol/L — ABNORMAL LOW (ref 135–145)

## 2019-01-08 LAB — GLUCOSE, CAPILLARY
Glucose-Capillary: 114 mg/dL — ABNORMAL HIGH (ref 70–99)
Glucose-Capillary: 114 mg/dL — ABNORMAL HIGH (ref 70–99)
Glucose-Capillary: 157 mg/dL — ABNORMAL HIGH (ref 70–99)
Glucose-Capillary: 121 mg/dL — ABNORMAL HIGH (ref 70–99)
Glucose-Capillary: 178 mg/dL — ABNORMAL HIGH (ref 70–99)

## 2019-01-08 LAB — CBC
HCT: 33.5 % — ABNORMAL LOW (ref 36.0–46.0)
Hemoglobin: 10.5 g/dL — ABNORMAL LOW (ref 12.0–15.0)
MCH: 31.6 pg (ref 26.0–34.0)
MCHC: 31.3 g/dL (ref 30.0–36.0)
MCV: 100.9 fL — ABNORMAL HIGH (ref 80.0–100.0)
Platelets: 256 10*3/uL (ref 150–400)
RBC: 3.32 MIL/uL — ABNORMAL LOW (ref 3.87–5.11)
RDW: 14.6 % (ref 11.5–15.5)
WBC: 10 10*3/uL (ref 4.0–10.5)
nRBC: 0 % (ref 0.0–0.2)

## 2019-01-08 MED ORDER — POLYETHYLENE GLYCOL 3350 17 G PO PACK: 17.0000 g | PACK | Freq: Every day | ORAL | Status: DC | PRN

## 2019-01-08 MED ORDER — FREE WATER
200.0000 mL | Freq: Three times a day (TID) | Status: DC
  Administered 2019-01-08 (×2): 200 mL

## 2019-01-08 NOTE — Progress Notes (Signed)
Updated patient's husband Shanon Brow via telephone. All questions answered at this time.

## 2019-01-08 NOTE — Progress Notes (Signed)
Head turned to right.  ETT secure.

## 2019-01-08 NOTE — Progress Notes (Signed)
[facility]  I called her husband for an update, he voiced understanding I told him her chances of survival are < 50% and it will take a significant amount of effort and suffering to make it out of the hospital.  I reassured him that the team here will do everything in our power to minimize her suffering.  He stated that he wants to continue with aggressive management.  Roselie Awkward, MD [place] [facility] Pager: [phone] Cell: [phone] If no response, call [phone]

## 2019-01-08 NOTE — Progress Notes (Signed)
Pt proned at 1235 without any complications.  ETT secured with cloth tape. Face protected with mepilex.  Skin integrity is good.

## 2019-01-08 NOTE — Progress Notes (Signed)
NAME:  Kim Harrison, MRN:  [id number], DOB:  April 08, 1955, LOS: 49 ADMISSION DATE:  12/11/2018, CONSULTATION DATE: 11/30 REFERRING MD: Dr. Sherral Hammers, CHIEF COMPLAINT: Shortness of breath  Brief History   63 year old female admitted 11/9 with a 10-day history of shortness of breath.  Outpatient testing for Covid was initially negative, then positive on 10/30. As an outpatient she completed 2 rounds of antibiotics and steroids. She presented to [facility] on 11/9 after a syncopal episode and was found to be COVID-19 positive.  After transfer to [facility] the patient was treated with steroids, remdesivir, convalescent plasma and 2 doses of tocilizumab.  She required high flow oxygen.  Follow-up CTA imaging of the chest revealed bilateral groundglass opacities, septal thickening and no PE.  Course notable for worsening on 11/27 with hypotension and worsening of left lower lobe infiltrate.  Cultures were obtained and the patient was treated for suspected superimposed bacterial pneumonia.  On 11/30 she decompensated requiring intubation.   Past Medical History  Sarcoidosis Hypothyroidism Dyslipidemia Anxiety Cervical Cancer   Significant Hospital Events   11/09 Admit 11/14 Transfer to ICU  11/17 Transfer to PCU 11/27 Worsening of hypoxemic respiratory failure 11/29 Transfer back to ICU for worsening hypoxemia  11/30 Decompensation requiring intubation, paralytics 12/01 Prone positioning, acidosis overnight 12/02 New AF, later RVR in pm.  Prone held.  RBBB on EKG. Bedside ECHO w/o RV fx/septal bowing 12/04 Prone positioning 12/06 Prone held due to facial swelling  12/07 Prone positioning held due to significant facial swelling. Peak 34, plat 34 FiO2 100, peep 16 12/09 Resume prone positioning, P/F ratio 118  Consults:    Procedures:  ETT 11/30 >> L IJ TLC 11/30 >>  R Rad Aline 12/1 >>   Significant Diagnostic Tests:   CTA Chest 11/9 >> negative for PE, interlobular septal thickening,  bilateral groundglass opacities with lower lobe predominance  CTA Chest 11/14 >> negative for PE, moderate worsening of groundglass opacities and septal thickening since prior exam superimposed on changes of sarcoidosis  ECHO 11/28 >> LVEF 60 to 65%, LVH, grade 1 diastolic dysfunction, LA/RA normal size, trace MVR, mild TVR, no evidence of left ear, mildly elevated pulmonary artery systolic pressure  ECHO 99991111 >> LVEF 55-60%, normal LV function, no LVH, global RV normal systolic function, RV moderately enlarged, LA normal, RA mildly dilated, trace MVR, mild TVR, PVR trivial, mildly elevated PAS pressure  Micro Data:  SARS CoV2 11/06 >> positive MRSA PCR 11/14 >> negative  BCx2 11/28 >> negative  Antimicrobials:  Remdesivir 11/9 >> 11/14  Convalescent plasma 11/11  Toclizumab 11/11 & 11/13  Aztreonam 11/28 >> 12/4  Interim history/subjective:  P/F ratio 118 Peak 36, Pplat 31, PEEP 16 / FiO2 80% I/O - 2.9L UOP in last 24h, net neg 666ml 24h.    Objective   Blood pressure 117/62, pulse 78, temperature 98.1 F (36.7 C), temperature source Oral, resp. rate (!) 30, height 5\' 5"  (1.651 m), weight 78.8 kg, SpO2 96 %. CVP:  [9 mmHg-21 mmHg] 9 mmHg  Vent Mode: PRVC FiO2 (%):  [80 %-100 %] 80 % Set Rate:  [30 bmp] 30 bmp Vt Set:  [340 mL] 340 mL PEEP:  [16 cmH20] 16 cmH20 Plateau Pressure:  [31 cmH20-35 cmH20] 31 cmH20   Intake/Output Summary (Last 24 hours) at 01/08/2019 1210 Last data filed at 01/08/2019 0900 Gross per 24 hour  Intake 1772.06 ml  Output 2470 ml  Net -697.94 ml   Filed Weights   01/03/19 0443 01/07/19 0500  01/08/19 0500  Weight: 80.3 kg 78.2 kg 78.8 kg    Examination: General: critically ill appearing female lying in bed on vent in NAD, appears less edematous  HEENT: MM pink/moist, ETT, pink tinged nasal secretions, pupils =/reactive, L IJ TLC Neuro: sedate CV: s1s2 rrr, no m/r/g PULM:  Non-labored on vent, coarse breath sounds bilaterally, synchronous  GI:  soft, bsx4 active  Extremities: warm/dry, generalized 2+ pitting edema / improved overall in appearance  Skin: no rashes or lesions  CXR 12/9 >>   Resolved Hospital Problem list   Elevated troponin from demand ischemia (preserved ER on Echo), Septic shock from HCAP, Elevated LFTs from shock  Assessment & Plan:   Acute hypoxic, hypercapnic respiratory failure with ARDS Initially treated for COVID PNA with improvement; completed remesivir, decadron.  Hx Sarcoidosis. Worsening hypoxia 11/29, tx back to ICU with intubation from probable HCAP.   PEEP 16/80%, PAP 36, Pplat 31, Driving Pressure 15 -low Vt ventilation 4-8cc/kg -goal plateau pressure <30, driving pressure [id number] cm H2O -target PaO2 55-65, titrate PEEP/FiO2 per ARDS protocol  -if P/F ratio <150, resume prone therapy for 16 hours 12/9  -goal CVP <4, diuresis as necessary -VAP prevention measures  -follow intermittent CXR   PAF RBBB -tele monitoring  -continue ASA -continue fondaparinux, allergy reported to lovenox   Acute metabolic encephalopathy Hx of anxiety -RASS goal -4 to -5 with PRN NMB -continue dilaudid, versed gtt's -klonopin, lexapro  Hypernatremia -reduce free water  Macrocytic Anemia  Thrombocytopenia -trend CBC -transfuse for Hgb <7% or active bleeding  -folate, B12, thiamine   Hyperglycemia Hx of hypothyroidism -SSI -continue synthroid  Best practice:  Diet: TF DVT prophylaxis: fondaparinux GI prophylaxis: pepcid  Mobility: BR  Code Status: Full Code  Family Communication: Husband Shanon Brow [phone]) updated 12/8 via phone. He requests Dr. Lake Bells update on 12/9 if possible.  Burns Spain (daughter [phone]).  Wetzel Bjornstad (daughter [phone]).  Husband works as a Engineer, drilling and at times has limited cell signal.  Please call Wells Guiles first if he can not be reached.  Disposition: ICU   Labs    CMP Latest Ref Rng & Units 01/08/2019 01/08/2019 01/07/2019  Glucose 70 - 99 mg/dL 114(H) -  165(H)  BUN 8 - 23 mg/dL 22 - 23  Creatinine 0.44 - 1.00 mg/dL 0.53 - 0.53  Sodium 135 - 145 mmol/L 134(L) 131(L) 141  Potassium 3.5 - 5.1 mmol/L 3.7 3.5 4.0  Chloride 98 - 111 mmol/L 89(L) - 89(L)  CO2 22 - 32 mmol/L 38(H) - 45(H)  Calcium 8.9 - 10.3 mg/dL 8.5(L) - 8.3(L)  Total Protein 6.5 - 8.1 g/dL - - -  Total Bilirubin 0.3 - 1.2 mg/dL - - -  Alkaline Phos 38 - 126 U/L - - -  AST 15 - 41 U/L - - -  ALT 0 - 44 U/L - - -    CBC Latest Ref Rng & Units 01/08/2019 01/08/2019 01/07/2019  WBC 4.0 - 10.5 K/uL 10.0 - 8.2  Hemoglobin 12.0 - 15.0 g/dL 10.5(L) 9.9(L) 10.6(L)  Hematocrit 36.0 - 46.0 % 33.5(L) 29.0(L) 33.9(L)  Platelets 150 - 400 K/uL 256 - 211    ABG    Component Value Date/Time   PHART 7.304 (L) 01/08/2019 0305   PCO2ART 79.4 (HH) 01/08/2019 0305   PO2ART 76.0 (L) 01/08/2019 0305   HCO3 39.1 (H) 01/08/2019 0305   TCO2 41 (H) 01/08/2019 0305   O2SAT 92.0 01/08/2019 0305    CBG (last 3)  Recent Labs  01/08/19 0413 01/08/19 0735 01/08/19 1152  GLUCAP 114* 114* 157*    CC Time: 30 minutes  Noe Gens, MSN, NP-C [facility] 01/08/2019, 12:10 PM   Please see [email or web address] for pager details.

## 2019-01-09 LAB — BASIC METABOLIC PANEL
Anion gap: 9 (ref 5–15)
BUN: 30 mg/dL — ABNORMAL HIGH (ref 8–23)
CO2: 37 mmol/L — ABNORMAL HIGH (ref 22–32)
Calcium: 8.8 mg/dL — ABNORMAL LOW (ref 8.9–10.3)
Chloride: 89 mmol/L — ABNORMAL LOW (ref 98–111)
Creatinine, Ser: 0.42 mg/dL — ABNORMAL LOW (ref 0.44–1.00)
GFR calc Af Amer: >60 mL/min (ref >60)
GFR calc non Af Amer: >60 mL/min (ref >60)
Glucose, Bld: 149 mg/dL — ABNORMAL HIGH (ref 70–99)
Potassium: 3.8 mmol/L (ref 3.5–5.1)
Sodium: 135 mmol/L (ref 135–145)

## 2019-01-09 LAB — CBC
HCT: 30.9 % — ABNORMAL LOW (ref 36.0–46.0)
Hemoglobin: 9.8 g/dL — ABNORMAL LOW (ref 12.0–15.0)
MCH: 31.6 pg (ref 26.0–34.0)
MCHC: 31.7 g/dL (ref 30.0–36.0)
MCV: 99.7 fL (ref 80.0–100.0)
Platelets: 240 10*3/uL (ref 150–400)
RBC: 3.1 MIL/uL — ABNORMAL LOW (ref 3.87–5.11)
RDW: 14.5 % (ref 11.5–15.5)
WBC: 8.4 10*3/uL (ref 4.0–10.5)
nRBC: 0 % (ref 0.0–0.2)

## 2019-01-09 LAB — POCT I-STAT 7, (LYTES, BLD GAS, ICA,H+H)
Acid-Base Excess: 10 mmol/L — ABNORMAL HIGH (ref 0.0–2.0)
Bicarbonate: 38.9 mmol/L — ABNORMAL HIGH (ref 20.0–28.0)
Calcium, Ion: 1.33 mmol/L (ref 1.15–1.40)
HCT: 27 % — ABNORMAL LOW (ref 36.0–46.0)
Hemoglobin: 9.2 g/dL — ABNORMAL LOW (ref 12.0–15.0)
O2 Saturation: 86 %
Patient temperature: 98.2
Potassium: 3.8 mmol/L (ref 3.5–5.1)
Sodium: 131 mmol/L — ABNORMAL LOW (ref 135–145)
TCO2: 41 mmol/L — ABNORMAL HIGH (ref 22–32)
pCO2 arterial: 79.1 mmHg (ref 32.0–48.0)
pH, Arterial: 7.298 — ABNORMAL LOW (ref 7.350–7.450)
pO2, Arterial: 60 mmHg — ABNORMAL LOW (ref 83.0–108.0)

## 2019-01-09 LAB — GLUCOSE, CAPILLARY
Glucose-Capillary: 103 mg/dL — ABNORMAL HIGH (ref 70–99)
Glucose-Capillary: 124 mg/dL — ABNORMAL HIGH (ref 70–99)
Glucose-Capillary: 129 mg/dL — ABNORMAL HIGH (ref 70–99)
Glucose-Capillary: 143 mg/dL — ABNORMAL HIGH (ref 70–99)
Glucose-Capillary: 155 mg/dL — ABNORMAL HIGH (ref 70–99)
Glucose-Capillary: 159 mg/dL — ABNORMAL HIGH (ref 70–99)
Glucose-Capillary: 166 mg/dL — ABNORMAL HIGH (ref 70–99)

## 2019-01-09 MED ORDER — ACETAZOLAMIDE SODIUM 500 MG IJ SOLR
500.0000 mg | Freq: Once | INTRAMUSCULAR | Status: AC
  Administered 2019-01-09: 500 mg via INTRAVENOUS
  Filled 2019-01-09: qty 500

## 2019-01-09 MED ORDER — POTASSIUM CHLORIDE 20 MEQ/15ML (10%) PO SOLN
40.0000 meq | Freq: Once | ORAL | Status: AC
  Administered 2019-01-09: 40 meq
  Filled 2019-01-09: qty 30

## 2019-01-09 MED ORDER — FREE WATER
100.0000 mL | Freq: Three times a day (TID) | Status: DC
  Administered 2019-01-09 – 2019-01-17 (×22): 100 mL

## 2019-01-09 MED ORDER — FUROSEMIDE 10 MG/ML IJ SOLN
40.0000 mg | Freq: Once | INTRAMUSCULAR | Status: AC
  Administered 2019-01-09: 40 mg via INTRAVENOUS
  Filled 2019-01-09: qty 4

## 2019-01-09 MED ORDER — METOCLOPRAMIDE HCL 5 MG/ML IJ SOLN
5.0000 mg | Freq: Once | INTRAMUSCULAR | Status: AC
  Administered 2019-01-09: 5 mg via INTRAVENOUS
  Filled 2019-01-09: qty 1

## 2019-01-09 NOTE — Progress Notes (Signed)
Assisted tele visit to patient with family member.  Olanda Boughner P, RN  

## 2019-01-09 NOTE — Progress Notes (Signed)
Attending:    Subjective: No acute events  Objective: Vitals:   01/09/19 1100 01/09/19 1135 01/09/19 1200 01/09/19 1300  BP: 108/61  (!) 104/54 (!) 97/53  Pulse: 73  76 73  Resp: (!) 25  (!) 26 (!) 26  Temp:  98.7 F (37.1 C)    TempSrc:  Oral    SpO2: 93%  95% 94%  Weight:      Height:       Vent Mode: PRVC FiO2 (%):  [80 %-100 %] 80 % Set Rate:  [30 bmp] 30 bmp Vt Set:  [340 mL] 340 mL PEEP:  [16 cmH20] 16 cmH20 Plateau Pressure:  [25 cmH20-39 cmH20] 36 cmH20  Intake/Output Summary (Last 24 hours) at 01/09/2019 1425 Last data filed at 01/09/2019 1300 Gross per 24 hour  Intake 1579.85 ml  Output 1650 ml  Net -70.15 ml   General:  In bed on vent HENT: NCAT ETT in place PULM: CTA B, vent supported breathing CV: RRR, no mgr GI: BS+, soft, nontender MSK: normal bulk and tone Neuro: sedated on vent     CBC    Component Value Date/Time   WBC 8.4 01/09/2019 0430   RBC 3.10 (L) 01/09/2019 0430   HGB 9.2 (L) 01/09/2019 0936   HCT 27.0 (L) 01/09/2019 0936   PLT 240 01/09/2019 0430   MCV 99.7 01/09/2019 0430   MCH 31.6 01/09/2019 0430   MCHC 31.7 01/09/2019 0430   RDW 14.5 01/09/2019 0430   LYMPHSABS 0.4 (L) 01/04/2019 0430   MONOABS 0.2 01/04/2019 0430   EOSABS 0.2 01/04/2019 0430   BASOSABS 0.0 01/04/2019 0430    BMET    Component Value Date/Time   NA 131 (L) 01/09/2019 0936   K 3.8 01/09/2019 0936   CL 89 (L) 01/09/2019 0430   CO2 37 (H) 01/09/2019 0430   GLUCOSE 149 (H) 01/09/2019 0430   BUN 30 (H) 01/09/2019 0430   CREATININE 0.42 (L) 01/09/2019 0430   CALCIUM 8.8 (L) 01/09/2019 0430   GFRNONAA >60 01/09/2019 0430   GFRAA >60 01/09/2019 0430      Impression/Plan: ARDS due to COVID 19, prolonged process, minimal progress, will take weeks to recover, continue ARDS protocol, VAP prevention, diurese with diamox again today as that worked well two days ago Sedation: RASS target -4, continue sedation protocol  Res as per NP note  My cc time 31  minutes  Roselie Awkward, MD [place] [facility] Pager: [phone] Cell: [phone] After 3pm or if no response, call [phone]

## 2019-01-09 NOTE — Progress Notes (Signed)
Pt's husband Shanon Brow called to unit. Updated of pt condition and plan of care. Answered David's questions. Shanon Brow appreciates update.

## 2019-01-09 NOTE — Progress Notes (Signed)
NAME:  Kim Harrison, MRN:  [id number], DOB:  23-Nov-1955, LOS: 24 ADMISSION DATE:  12/22/2018, CONSULTATION DATE: 11/30 REFERRING MD: Dr. Sherral Hammers, CHIEF COMPLAINT: Shortness of breath  Brief History   63 year old female admitted 11/9 with a 10-day history of shortness of breath.  Outpatient testing for Covid was initially negative, then positive on 10/30. As an outpatient she completed 2 rounds of antibiotics and steroids. She presented to [facility] on 11/9 after a syncopal episode and was found to be COVID-19 positive.  After transfer to [facility] the patient was treated with steroids, remdesivir, convalescent plasma and 2 doses of tocilizumab.  She required high flow oxygen.  Follow-up CTA imaging of the chest revealed bilateral groundglass opacities, septal thickening and no PE.  Course notable for worsening on 11/27 with hypotension and worsening of left lower lobe infiltrate.  Cultures were obtained and the patient was treated for suspected superimposed bacterial pneumonia.  On 11/30 she decompensated requiring intubation.   Past Medical History  Sarcoidosis Hypothyroidism Dyslipidemia Anxiety Cervical Cancer   Significant Hospital Events   11/09 Admit 11/14 Transfer to ICU  11/17 Transfer to PCU 11/27 Worsening of hypoxemic respiratory failure 11/29 Transfer back to ICU for worsening hypoxemia  11/30 Decompensation requiring intubation, paralytics 12/01 Prone positioning, acidosis overnight 12/02 New AF, later RVR in pm.  Prone held.  RBBB on EKG. Bedside ECHO w/o RV fx/septal bowing 12/04 Prone positioning 12/06 Prone held due to facial swelling  12/07 Prone positioning held due to significant facial swelling. Peak 34, plat 34 FiO2 100, peep 16 12/09 Resume prone positioning, P/F ratio 118  Consults:    Procedures:  ETT 11/30 >> L IJ TLC 11/30 >>  R Rad Aline 12/1 >>   Significant Diagnostic Tests:   CTA Chest 11/9 >> negative for PE, interlobular septal thickening,  bilateral groundglass opacities with lower lobe predominance  CTA Chest 11/14 >> negative for PE, moderate worsening of groundglass opacities and septal thickening since prior exam superimposed on changes of sarcoidosis  ECHO 11/28 >> LVEF 60 to 65%, LVH, grade 1 diastolic dysfunction, LA/RA normal size, trace MVR, mild TVR, no evidence of left ear, mildly elevated pulmonary artery systolic pressure  ECHO 99991111 >> LVEF 55-60%, normal LV function, no LVH, global RV normal systolic function, RV moderately enlarged, LA normal, RA mildly dilated, trace MVR, mild TVR, PVR trivial, mildly elevated PAS pressure  Micro Data:  SARS CoV2 11/06 >> positive MRSA PCR 11/14 >> negative  BCx2 11/28 >> negative  Antimicrobials:  Remdesivir 11/9 >> 11/14  Convalescent plasma 11/11  Toclizumab 11/11 & 11/13  Aztreonam 11/28 >> 12/4  Interim history/subjective:  Peak 39, Pplat 36, P/F 75.   FiO2 80%, PEEP 16 Vomited 0500 while prone, TF on hold , reglan 5mg  x1 ABG pending Levo down to 3, remains on vaso  Objective   Blood pressure (!) 117/50, pulse 70, temperature 98.6 F (37 C), temperature source Axillary, resp. rate (!) 32, height 5\' 5"  (1.651 m), weight 78.8 kg, SpO2 95 %. CVP:  [8 mmHg-11 mmHg] 9 mmHg  Vent Mode: PRVC FiO2 (%):  [80 %-100 %] 90 % Set Rate:  [30 bmp] 30 bmp Vt Set:  [340 mL] 340 mL PEEP:  [16 cmH20] 16 cmH20 Plateau Pressure:  [25 cmH20-39 cmH20] 36 cmH20   Intake/Output Summary (Last 24 hours) at 01/09/2019 0927 Last data filed at 01/09/2019 0600 Gross per 24 hour  Intake 1577.43 ml  Output 1340 ml  Net 237.43 ml  Filed Weights   01/03/19 0443 01/07/19 0500 01/08/19 0500  Weight: 80.3 kg 78.2 kg 78.8 kg    Examination: General: adult female lying in bed on vent, critically ill appearing in NAD HEENT: MM pink/moist, ETT, facial edema  Neuro: sedate CV: s1s2 rrr, no m/r/g PULM:  Non-labored on vent, lungs bilaterally coarse GI: soft, bsx4 active, non-distended   Extremities: warm/dry, generalized 2+ pitting edema  Skin: no rashes or lesions   Resolved Hospital Problem list   Elevated troponin from demand ischemia (preserved ER on Echo), Septic shock from HCAP, Elevated LFTs from shock  Assessment & Plan:   Acute hypoxic, hypercapnic respiratory failure with ARDS Initially treated for COVID PNA with improvement; completed remesivir, decadron.  Hx Sarcoidosis. Worsening hypoxia 11/29, tx back to ICU with intubation from probable HCAP.   PEEP 16/80%, PAP 36, Pplat 31, Driving Pressure 15 -low Vt ventilation 4-8cc/kg -goal plateau pressure <30, driving pressure [id number] cm H2O -target PaO2 55-65, titrate PEEP/FiO2 per ARDS protocol  -if P/F ratio <150, re-prone 12/10  -goal CVP <4, continue diuresis  -VAP prevention measures  -follow intermittent CXR   PAF RBBB Hypotension -tele monitoring  -continue ASA -fondaparinux for DVT prevention, allergy to lovenox  -wean levophed to off, then vasopressin   Acute metabolic encephalopathy Hx of anxiety -RASS goal -4 to -5 with PRN NMB -dilaudid, versed gtt's  -klonopin, lexapro PT  Hypernatremia -reduce free water to 115ml Q8  Macrocytic Anemia  Thrombocytopenia -trend CBC  -folate, thiamine, B12 -transfuse for Hgb <7% or active bleeding   Hyperglycemia Hx of hypothyroidism -SSI  -synthroid   Best practice:  Diet: TF DVT prophylaxis: fondaparinux GI prophylaxis: pepcid  Mobility: BR  Code Status: Full Code  Family Communication: Husband Shanon Brow [phone]) updated 12/10 via phone. Burns Spain (daughter [phone]).  Wetzel Bjornstad (daughter [phone]).  Husband works as a Engineer, drilling and at times has limited cell signal.  Please call Wells Guiles first if he can not be reached.  Disposition: ICU   Labs    CMP Latest Ref Rng & Units 01/09/2019 01/08/2019 01/08/2019  Glucose 70 - 99 mg/dL 149(H) 114(H) -  BUN 8 - 23 mg/dL 30(H) 22 -  Creatinine 0.44 - 1.00 mg/dL 0.42(L) 0.53 -  Sodium  135 - 145 mmol/L 135 134(L) 131(L)  Potassium 3.5 - 5.1 mmol/L 3.8 3.7 3.5  Chloride 98 - 111 mmol/L 89(L) 89(L) -  CO2 22 - 32 mmol/L 37(H) 38(H) -  Calcium 8.9 - 10.3 mg/dL 8.8(L) 8.5(L) -  Total Protein 6.5 - 8.1 g/dL - - -  Total Bilirubin 0.3 - 1.2 mg/dL - - -  Alkaline Phos 38 - 126 U/L - - -  AST 15 - 41 U/L - - -  ALT 0 - 44 U/L - - -    CBC Latest Ref Rng & Units 01/09/2019 01/08/2019 01/08/2019  WBC 4.0 - 10.5 K/uL 8.4 10.0 -  Hemoglobin 12.0 - 15.0 g/dL 9.8(L) 10.5(L) 9.9(L)  Hematocrit 36.0 - 46.0 % 30.9(L) 33.5(L) 29.0(L)  Platelets 150 - 400 K/uL 240 256 -    ABG    Component Value Date/Time   PHART 7.304 (L) 01/08/2019 0305   PCO2ART 79.4 (HH) 01/08/2019 0305   PO2ART 76.0 (L) 01/08/2019 0305   HCO3 39.1 (H) 01/08/2019 0305   TCO2 41 (H) 01/08/2019 0305   O2SAT 92.0 01/08/2019 0305    CBG (last 3)  Recent Labs    01/09/19 0039 01/09/19 0403 01/09/19 0810  GLUCAP 129* 124* 155*  CC Time: 49 minutes  Noe Gens, MSN, NP-C [facility] 01/09/2019, 9:27 AM   Please see [email or web address] for pager details.

## 2019-01-09 NOTE — Progress Notes (Addendum)
Patient placed on prone position by RT x 2 and RN x 4 without complications. ETT secured by cloth tape.  Bite block removed before patient proned.

## 2019-01-09 NOTE — Progress Notes (Signed)
Head turned at this time, no complications noted.  

## 2019-01-10 ENCOUNTER — Inpatient Hospital Stay (HOSPITAL_COMMUNITY): Payer: BC Managed Care – PPO

## 2019-01-10 ENCOUNTER — Inpatient Hospital Stay: Payer: Self-pay

## 2019-01-10 LAB — GLUCOSE, CAPILLARY
Glucose-Capillary: 114 mg/dL — ABNORMAL HIGH (ref 70–99)
Glucose-Capillary: 115 mg/dL — ABNORMAL HIGH (ref 70–99)
Glucose-Capillary: 119 mg/dL — ABNORMAL HIGH (ref 70–99)
Glucose-Capillary: 130 mg/dL — ABNORMAL HIGH (ref 70–99)
Glucose-Capillary: 163 mg/dL — ABNORMAL HIGH (ref 70–99)
Glucose-Capillary: 164 mg/dL — ABNORMAL HIGH (ref 70–99)

## 2019-01-10 LAB — POCT I-STAT 7, (LYTES, BLD GAS, ICA,H+H)
Acid-Base Excess: 8 mmol/L — ABNORMAL HIGH (ref 0.0–2.0)
Bicarbonate: 36.8 mmol/L — ABNORMAL HIGH (ref 20.0–28.0)
Calcium, Ion: 1.3 mmol/L (ref 1.15–1.40)
HCT: 27 % — ABNORMAL LOW (ref 36.0–46.0)
Hemoglobin: 9.2 g/dL — ABNORMAL LOW (ref 12.0–15.0)
O2 Saturation: 97 %
Potassium: 2.9 mmol/L — ABNORMAL LOW (ref 3.5–5.1)
Sodium: 136 mmol/L (ref 135–145)
TCO2: 39 mmol/L — ABNORMAL HIGH (ref 22–32)
pCO2 arterial: 76.5 mmHg (ref 32.0–48.0)
pH, Arterial: 7.289 — ABNORMAL LOW (ref 7.350–7.450)
pO2, Arterial: 102 mmHg (ref 83.0–108.0)

## 2019-01-10 LAB — BASIC METABOLIC PANEL
Anion gap: 12 (ref 5–15)
BUN: 32 mg/dL — ABNORMAL HIGH (ref 8–23)
CO2: 40 mmol/L — ABNORMAL HIGH (ref 22–32)
Calcium: 8.6 mg/dL — ABNORMAL LOW (ref 8.9–10.3)
Chloride: 90 mmol/L — ABNORMAL LOW (ref 98–111)
Creatinine, Ser: 0.39 mg/dL — ABNORMAL LOW (ref 0.44–1.00)
GFR calc Af Amer: >60 mL/min (ref >60)
GFR calc non Af Amer: >60 mL/min (ref >60)
Glucose, Bld: 130 mg/dL — ABNORMAL HIGH (ref 70–99)
Potassium: 3.4 mmol/L — ABNORMAL LOW (ref 3.5–5.1)
Sodium: 142 mmol/L (ref 135–145)

## 2019-01-10 LAB — CBC
HCT: 29.6 % — ABNORMAL LOW (ref 36.0–46.0)
Hemoglobin: 9.2 g/dL — ABNORMAL LOW (ref 12.0–15.0)
MCH: 31.6 pg (ref 26.0–34.0)
MCHC: 31.1 g/dL (ref 30.0–36.0)
MCV: 101.7 fL — ABNORMAL HIGH (ref 80.0–100.0)
Platelets: 275 10*3/uL (ref 150–400)
RBC: 2.91 MIL/uL — ABNORMAL LOW (ref 3.87–5.11)
RDW: 15.2 % (ref 11.5–15.5)
WBC: 8.9 10*3/uL (ref 4.0–10.5)
nRBC: 0 % (ref 0.0–0.2)

## 2019-01-10 MED ORDER — SODIUM CHLORIDE 0.9 % IV SOLN
0.5000 mg/h | INTRAVENOUS | Status: DC
  Administered 2019-01-11: 1 mg/h via INTRAVENOUS
  Administered 2019-01-12: 1.5 mg/h via INTRAVENOUS
  Filled 2019-01-10 (×6): qty 5

## 2019-01-10 MED ORDER — POTASSIUM CHLORIDE 20 MEQ/15ML (10%) PO SOLN
40.0000 meq | Freq: Two times a day (BID) | ORAL | Status: AC
  Administered 2019-01-10 – 2019-01-11 (×3): 40 meq
  Filled 2019-01-10 (×3): qty 30

## 2019-01-10 MED ORDER — SODIUM CHLORIDE 0.9% FLUSH
10.0000 mL | Freq: Two times a day (BID) | INTRAVENOUS | Status: DC
  Administered 2019-01-10 – 2019-01-11 (×2): 20 mL
  Administered 2019-01-11 – 2019-01-16 (×10): 10 mL

## 2019-01-10 MED ORDER — HYDROMORPHONE HCL 1 MG/ML IJ SOLN
1.0000 mg | Freq: Once | INTRAMUSCULAR | Status: DC
  Administered 2019-01-10: 1 mg via INTRAVENOUS

## 2019-01-10 MED ORDER — SODIUM CHLORIDE 0.9% FLUSH: 10.0000 mL | INTRAVENOUS | Status: DC | PRN

## 2019-01-10 MED ORDER — METOCLOPRAMIDE HCL 5 MG/ML IJ SOLN
5.0000 mg | Freq: Once | INTRAMUSCULAR | Status: AC
  Administered 2019-01-10: 13:00:00 5 mg via INTRAVENOUS
  Filled 2019-01-10: qty 1

## 2019-01-10 MED ORDER — MIDAZOLAM BOLUS VIA INFUSION
1.0000 mg | INTRAVENOUS | Status: DC | PRN
  Administered 2019-01-10 – 2019-01-12 (×3): 2 mg via INTRAVENOUS
  Filled 2019-01-10: qty 2

## 2019-01-10 MED ORDER — HYDROMORPHONE BOLUS VIA INFUSION
0.5000 mg | INTRAVENOUS | Status: DC | PRN
  Administered 2019-01-10 – 2019-01-12 (×8): 0.5 mg via INTRAVENOUS
  Filled 2019-01-10 (×2): qty 1

## 2019-01-10 MED ORDER — STERILE WATER FOR INJECTION IJ SOLN
INTRAMUSCULAR | Status: AC
  Administered 2019-01-10: 22:00:00
  Filled 2019-01-10: qty 10

## 2019-01-10 MED ORDER — MIDAZOLAM 50MG/50ML (1MG/ML) PREMIX INFUSION
0.0000 mg/h | INTRAVENOUS | Status: DC
  Administered 2019-01-10 – 2019-01-11 (×4): 6 mg/h via INTRAVENOUS
  Administered 2019-01-12: 7 mg/h via INTRAVENOUS
  Administered 2019-01-12: 5 mg/h via INTRAVENOUS
  Administered 2019-01-12: 8 mg/h via INTRAVENOUS
  Administered 2019-01-13 – 2019-01-14 (×3): 4 mg/h via INTRAVENOUS
  Filled 2019-01-10 (×11): qty 50

## 2019-01-10 NOTE — Progress Notes (Signed)
Turned off feeding due to residual 450cc of tan color and discarded, will give in report.

## 2019-01-10 NOTE — Progress Notes (Signed)
Peripherally Inserted Central Catheter/Midline Placement  The IV Nurse has discussed with the patient and/or persons authorized to consent for the patient, the purpose of this procedure and the potential benefits and risks involved with this procedure.  The benefits include less needle sticks, lab draws from the catheter, and the patient may be discharged home with the catheter. Risks include, but not limited to, infection, bleeding, blood clot (thrombus formation), and puncture of an artery; nerve damage and irregular heartbeat and possibility to perform a PICC exchange if needed/ordered by physician.  Alternatives to this procedure were also discussed.  Bard Power PICC patient education guide, fact sheet on infection prevention and patient information card has been provided to patient /or left at bedside.    PICC/Midline Placement Documentation  PICC Triple Lumen [id number] PICC Right Basilic 37 cm 0 cm (Active)  Indication for Insertion or Continuance of Line Vasoactive infusions 01/10/19 1808  Exposed Catheter (cm) 0 cm 01/10/19 1808  Site Assessment Clean;Dry;Intact 01/10/19 1808  Lumen #1 Status Flushed;Saline locked;Blood return noted 01/10/19 1808  Lumen #2 Status Flushed;Saline locked;Blood return noted 01/10/19 1808  Lumen #3 Status Flushed;Saline locked;Blood return noted 01/10/19 1808  Dressing Type Transparent 01/10/19 1808  Dressing Status Clean;Dry;Intact;Antimicrobial disc in place 01/10/19 1808  Dressing Change Due January 26, 2019 01/10/19 1808       Kim Harrison 01/10/2019, 6:11 PM

## 2019-01-10 NOTE — Progress Notes (Signed)
Pt returned to supine position with RT x2 and RN x3. ETT re secured with holister and cloth tape/protective barriers removed. Pt tolerated well, RT will continue to monitor.

## 2019-01-10 NOTE — Progress Notes (Signed)
Husband was notified and updated.

## 2019-01-10 NOTE — Progress Notes (Signed)
RT NOTE:  Pt ETT holister removed and protective barriers placed with cloth tape securing ETT at 25cm at the lip. Pt proned with RTx2 and RN x 4. Pt tolerated well, RT will continue to monitor

## 2019-01-10 NOTE — Progress Notes (Signed)
Assisted tele visit to patient with family member.  Mechel Schutter P, RN  

## 2019-01-10 NOTE — Progress Notes (Addendum)
NAME:  Kim Harrison, MRN:  [id number], DOB:  [birth year], LOS: 86 ADMISSION DATE:  12/30/2018, CONSULTATION DATE: 11/30 REFERRING MD: Dr. Sherral Hammers, CHIEF COMPLAINT: Shortness of breath  Brief History   63 year old female admitted 11/9 with a 10-day history of shortness of breath.  Outpatient testing for Covid was initially negative, then positive on 10/30. As an outpatient she completed 2 rounds of antibiotics and steroids. She presented to [facility] on 11/9 after a syncopal episode and was found to be COVID-19 positive.  After transfer to [facility] the patient was treated with steroids, remdesivir, convalescent plasma and 2 doses of tocilizumab.  She required high flow oxygen.  Follow-up CTA imaging of the chest revealed bilateral groundglass opacities, septal thickening and no PE.  Course notable for worsening on 11/27 with hypotension and worsening of left lower lobe infiltrate.  Cultures were obtained and the patient was treated for suspected superimposed bacterial pneumonia.  On 11/30 she decompensated requiring intubation.   Past Medical History  Sarcoidosis Hypothyroidism Dyslipidemia Anxiety Cervical Cancer   Significant Hospital Events   11/09 Admit 11/14 Transfer to ICU  11/17 Transfer to PCU 11/27 Worsening of hypoxemic respiratory failure 11/29 Transfer back to ICU for worsening hypoxemia  11/30 Decompensation requiring intubation, paralytics 12/01 Prone positioning, acidosis overnight 12/02 New AF, later RVR in pm.  Prone held.  RBBB on EKG. Bedside ECHO w/o RV fx/septal bowing 12/04 Prone positioning 12/06 Prone held due to facial swelling  12/07 Prone positioning held due to significant facial swelling. Peak 34, plat 34 FiO2 100, peep 16 12/09 Resume prone positioning, P/F ratio 118 12/11 Emesis early am while prone, TF held. Weaning vasopressors  Consults:    Procedures:  ETT 11/30 >> L IJ TLC 11/30 >>  R Rad Aline 12/1 >>   Significant Diagnostic Tests:    CTA Chest 11/9 >> negative for PE, interlobular septal thickening, bilateral groundglass opacities with lower lobe predominance  CTA Chest 11/14 >> negative for PE, moderate worsening of groundglass opacities and septal thickening since prior exam superimposed on changes of sarcoidosis  ECHO 11/28 >> LVEF 60 to 65%, LVH, grade 1 diastolic dysfunction, LA/RA normal size, trace MVR, mild TVR, no evidence of left ear, mildly elevated pulmonary artery systolic pressure  ECHO 99991111 >> LVEF 55-60%, normal LV function, no LVH, global RV normal systolic function, RV moderately enlarged, LA normal, RA mildly dilated, trace MVR, mild TVR, PVR trivial, mildly elevated PAS pressure  Micro Data:  SARS CoV2 11/06 >> positive MRSA PCR 11/14 >> negative  BCx2 11/28 >> negative  Antimicrobials:  Remdesivir 11/9 >> 11/14  Convalescent plasma 11/11  Toclizumab 11/11 & 11/13  Aztreonam 11/28 >> 12/4  Interim history/subjective:  Peak 38-40, Pplat 36-38, PEEP 16, fiO2 80% Vomiting while prone, TF held Smear BM Low K+ I/O - 2.5L UOP, -2.1L in 24h  Objective   Blood pressure (!) 112/43, pulse 73, temperature 97.9 F (36.6 C), temperature source Axillary, resp. rate (!) 24, height 5\' 5"  (075-GRM m), weight 73 kg, SpO2 100 %. CVP:  [10 mmHg-18 mmHg] 18 mmHg  Vent Mode: PRVC FiO2 (%):  [80 %-100 %] 80 % Set Rate:  [30 bmp] 30 bmp Vt Set:  [340 mL] 340 mL PEEP:  [16 cmH20] 16 cmH20 Plateau Pressure:  [32 cmH20-37 cmH20] 37 cmH20   Intake/Output Summary (Last 24 hours) at 01/10/2019 0831 Last data filed at 01/10/2019 0700 Gross per 24 hour  Intake 859.67 ml  Output 2997 ml  Net -2137.33 ml  Filed Weights   01/07/19 0500 01/08/19 0500 01/10/19 0456  Weight: 78.2 kg 78.8 kg 73 kg    Examination: General: adult female lying in bed on vent in NAD HEENT: MM pink/moist, ETT, facial swelling / prone  Neuro: sedate CV: s1s2 RRR, no m/r/g PULM:  Synchronous with vent, non-labored, lungs  bilaterally coarse without wheeze  GI: soft, bsx4 active  Extremities: warm/dry, generalized 1-2+ edema (improved)  Skin: no rashes or lesions  CXR 12/11 > images personally reviewed, ? RML collapse, diffuse bilateral interstitial airspace disease   Resolved Hospital Problem list   Elevated troponin from demand ischemia (preserved ER on Echo), Septic shock from HCAP, Elevated LFTs from shock  Assessment & Plan:   Acute hypoxic, hypercapnic respiratory failure with ARDS Initially treated for COVID PNA with improvement; completed remesivir, decadron.  Hx Sarcoidosis. Worsening hypoxia 11/29, tx back to ICU with intubation from probable HCAP.   Peak 38-40, Pplat 36-38, PEEP 16, fiO2 80%, Driving pressure [id number] -low Vt ventilation 4-8cc/kg -goal plateau pressure <30, driving pressure [id number] cm H2O -target PaO2 55-65, titrate PEEP/FiO2 per ARDS protocol  -ABG post supine turn, if P/F ratio <150, repeat prone therapy for 16 hours per day -goal CVP <4, diuresis as necessary -VAP prevention measures  -follow intermittent CXR   PAF RBBB Hypotension -tele monitoring -continue ASA -wean vasopressors off for MAP >65 -place PICC line, discontinue central line once PICC in place  -continue fondaparinux for DVT prevention, allergy to lovenox   Acute metabolic encephalopathy Hx of anxiety -RASS Goal: -3  -continue dilaudid / versed  -klonopin, lexapro PT   Emesis  Episode 12/9, 12/11 -Get KUB once supine -hold TF for now -reglan 5mg  IV x1  Hypokalemia -monitor, replace as indicated  Hypernatremia -free water 100 ml Q8  Macrocytic Anemia  Thrombocytopenia -trend CBC -continue folate, thiamine, B12 -transfuse for Hgb <7% or active bleeding   Hyperglycemia Hx of hypothyroidism -SSI  -synthroid   Best practice:  Diet: TF DVT prophylaxis: fondaparinux GI prophylaxis: pepcid  Mobility: BR  Code Status: Full Code  Family Communication: Husband Shanon Brow [phone]) updated  12/11 via phone. Burns Spain (daughter [phone]).  Wetzel Bjornstad (daughter [phone]).  Husband works as a Engineer, drilling and at times has limited cell signal.  Please call Wells Guiles first if he can not be reached.  Disposition: ICU   Labs    CMP Latest Ref Rng & Units 01/10/2019 01/09/2019 01/09/2019  Glucose 70 - 99 mg/dL 130(H) - 149(H)  BUN 8 - 23 mg/dL 32(H) - 30(H)  Creatinine 0.44 - 1.00 mg/dL 0.39(L) - 0.42(L)  Sodium 135 - 145 mmol/L 142 131(L) 135  Potassium 3.5 - 5.1 mmol/L 3.4(L) 3.8 3.8  Chloride 98 - 111 mmol/L 90(L) - 89(L)  CO2 22 - 32 mmol/L 40(H) - 37(H)  Calcium 8.9 - 10.3 mg/dL 8.6(L) - 8.8(L)  Total Protein 6.5 - 8.1 g/dL - - -  Total Bilirubin 0.3 - 1.2 mg/dL - - -  Alkaline Phos 38 - 126 U/L - - -  AST 15 - 41 U/L - - -  ALT 0 - 44 U/L - - -    CBC Latest Ref Rng & Units 01/10/2019 01/09/2019 01/09/2019  WBC 4.0 - 10.5 K/uL 8.9 - 8.4  Hemoglobin 12.0 - 15.0 g/dL 9.2(L) 9.2(L) 9.8(L)  Hematocrit 36.0 - 46.0 % 29.6(L) 27.0(L) 30.9(L)  Platelets 150 - 400 K/uL 275 - 240    ABG    Component Value Date/Time   PHART 7.298 (L) 01/09/2019  0936   PCO2ART 79.1 (HH) 01/09/2019 0936   PO2ART 60.0 (L) 01/09/2019 0936   HCO3 38.9 (H) 01/09/2019 0936   TCO2 41 (H) 01/09/2019 0936   O2SAT 86.0 01/09/2019 0936    CBG (last 3)  Recent Labs    01/09/19 2357 01/10/19 0400 01/10/19 0803  GLUCAP 163* 130* 115*    CC Time: 30 minutes  Noe Gens, MSN, NP-C [facility] 01/10/2019, 8:31 AM   Please see [email or web address] for pager details.

## 2019-01-10 NOTE — Progress Notes (Signed)
Attending:    Subjective:    Objective: Vitals:   01/10/19 0615 01/10/19 0630 01/10/19 0645 01/10/19 0700  BP:      Pulse: 75 75 73 73  Resp: (!) 24 (!) 25 (!) 28 (!) 24  Temp:      TempSrc:      SpO2: 100% 99% 100% 100%  Weight:      Height:       Vent Mode: PRVC FiO2 (%):  [80 %-100 %] 80 % Set Rate:  [30 bmp] 30 bmp Vt Set:  [340 mL] 340 mL PEEP:  [16 cmH20] 16 cmH20 Plateau Pressure:  [32 cmH20-37 cmH20] 37 cmH20  Intake/Output Summary (Last 24 hours) at 01/10/2019 1051 Last data filed at 01/10/2019 0700 Gross per 24 hour  Intake 658.19 ml  Output 2862 ml  Net -2203.81 ml    General:  In bed on vent HENT: NCAT ETT in place PULM: CTA B, vent supported breathing CV: prone, difficult to examine GI: prone, difficult to examine MSK: normal bulk and tone Neuro: sedated on vent    CBC    Component Value Date/Time   WBC 8.9 01/10/2019 0455   RBC 2.91 (L) 01/10/2019 0455   HGB 9.2 (L) 01/10/2019 0455   HCT 29.6 (L) 01/10/2019 0455   PLT 275 01/10/2019 0455   MCV 101.7 (H) 01/10/2019 0455   MCH 31.6 01/10/2019 0455   MCHC 31.1 01/10/2019 0455   RDW 15.2 01/10/2019 0455   LYMPHSABS 0.4 (L) 01/04/2019 0430   MONOABS 0.2 01/04/2019 0430   EOSABS 0.2 01/04/2019 0430   BASOSABS 0.0 01/04/2019 0430    BMET    Component Value Date/Time   NA 142 01/10/2019 0455   K 3.4 (L) 01/10/2019 0455   CL 90 (L) 01/10/2019 0455   CO2 40 (H) 01/10/2019 0455   GLUCOSE 130 (H) 01/10/2019 0455   BUN 32 (H) 01/10/2019 0455   CREATININE 0.39 (L) 01/10/2019 0455   CALCIUM 8.6 (L) 01/10/2019 0455   GFRNONAA >60 01/10/2019 0455   GFRAA >60 01/10/2019 0455    CXR images reviewed, bilateral interstitial infiltrate, ETT in place, no change compared to prior  Impression/Plan:  ARDS> fibroproliferative phase, suspect this will take weeks to recover, continue to titrate down PEEP/FiO2 as able, maintaining Plateau as low as possible, driving pressure < 15 as able, diurese again  today, replace K aggressively  Need for sedation for mechanical ventilation> continue PAD protocol, clonazepam, dilaudid, versed infusion, d/c intermittent paralytic protocol, change to RASS target -3 to -4  Shock due to sedation, wean off levophed and vasopressin for MAP> 65, decrease sedation  My cc time 32 minutes  Roselie Awkward, MD [place] [facility] Pager: [phone] Cell: [phone] After 3pm or if no response, call [phone]

## 2019-01-11 ENCOUNTER — Inpatient Hospital Stay (HOSPITAL_COMMUNITY): Payer: BC Managed Care – PPO

## 2019-01-11 DIAGNOSIS — R1110 Vomiting, unspecified: Secondary | ICD-10-CM

## 2019-01-11 DIAGNOSIS — R579 Shock, unspecified: Secondary | ICD-10-CM

## 2019-01-11 DIAGNOSIS — Z9911 Dependence on respirator [ventilator] status: Secondary | ICD-10-CM

## 2019-01-11 LAB — CBC
HCT: 30.4 % — ABNORMAL LOW (ref 36.0–46.0)
Hemoglobin: 9.2 g/dL — ABNORMAL LOW (ref 12.0–15.0)
MCH: 31.3 pg (ref 26.0–34.0)
MCHC: 30.3 g/dL (ref 30.0–36.0)
MCV: 103.4 fL — ABNORMAL HIGH (ref 80.0–100.0)
Platelets: 301 10*3/uL (ref 150–400)
RBC: 2.94 MIL/uL — ABNORMAL LOW (ref 3.87–5.11)
RDW: 15.9 % — ABNORMAL HIGH (ref 11.5–15.5)
WBC: 9.6 10*3/uL (ref 4.0–10.5)
nRBC: 0 % (ref 0.0–0.2)

## 2019-01-11 LAB — POCT I-STAT 7, (LYTES, BLD GAS, ICA,H+H)
Acid-Base Excess: 8 mmol/L — ABNORMAL HIGH (ref 0.0–2.0)
Bicarbonate: 37.1 mmol/L — ABNORMAL HIGH (ref 20.0–28.0)
Calcium, Ion: 1.44 mmol/L — ABNORMAL HIGH (ref 1.15–1.40)
HCT: 26 % — ABNORMAL LOW (ref 36.0–46.0)
Hemoglobin: 8.8 g/dL — ABNORMAL LOW (ref 12.0–15.0)
O2 Saturation: 88 %
Patient temperature: 97.7
Potassium: 4.5 mmol/L (ref 3.5–5.1)
Sodium: 138 mmol/L (ref 135–145)
TCO2: 40 mmol/L — ABNORMAL HIGH (ref 22–32)
pCO2 arterial: 87.3 mmHg (ref 32.0–48.0)
pH, Arterial: 7.233 — ABNORMAL LOW (ref 7.350–7.450)
pO2, Arterial: 66 mmHg — ABNORMAL LOW (ref 83.0–108.0)

## 2019-01-11 LAB — BASIC METABOLIC PANEL
Anion gap: 6 (ref 5–15)
BUN: 35 mg/dL — ABNORMAL HIGH (ref 8–23)
CO2: 40 mmol/L — ABNORMAL HIGH (ref 22–32)
Calcium: 9.3 mg/dL (ref 8.9–10.3)
Chloride: 92 mmol/L — ABNORMAL LOW (ref 98–111)
Creatinine, Ser: 0.4 mg/dL — ABNORMAL LOW (ref 0.44–1.00)
GFR calc Af Amer: >60 mL/min (ref >60)
GFR calc non Af Amer: >60 mL/min (ref >60)
Glucose, Bld: 135 mg/dL — ABNORMAL HIGH (ref 70–99)
Potassium: 3.9 mmol/L (ref 3.5–5.1)
Sodium: 138 mmol/L (ref 135–145)

## 2019-01-11 LAB — GLUCOSE, CAPILLARY
Glucose-Capillary: 114 mg/dL — ABNORMAL HIGH (ref 70–99)
Glucose-Capillary: 114 mg/dL — ABNORMAL HIGH (ref 70–99)
Glucose-Capillary: 127 mg/dL — ABNORMAL HIGH (ref 70–99)
Glucose-Capillary: 133 mg/dL — ABNORMAL HIGH (ref 70–99)
Glucose-Capillary: 135 mg/dL — ABNORMAL HIGH (ref 70–99)
Glucose-Capillary: 155 mg/dL — ABNORMAL HIGH (ref 70–99)

## 2019-01-11 LAB — MRSA PCR SCREENING: MRSA by PCR: POSITIVE — AB

## 2019-01-11 MED ORDER — POTASSIUM CHLORIDE 20 MEQ/15ML (10%) PO SOLN
40.0000 meq | Freq: Two times a day (BID) | ORAL | Status: AC
  Administered 2019-01-11 (×2): 40 meq
  Filled 2019-01-11 (×2): qty 30

## 2019-01-11 MED ORDER — NOREPINEPHRINE 4 MG/250ML-% IV SOLN
INTRAVENOUS | Status: AC
  Filled 2019-01-11: qty 250

## 2019-01-11 MED ORDER — FUROSEMIDE 10 MG/ML IJ SOLN
40.0000 mg | Freq: Two times a day (BID) | INTRAMUSCULAR | Status: AC
  Administered 2019-01-11 – 2019-01-12 (×2): 40 mg via INTRAVENOUS
  Filled 2019-01-11 (×2): qty 4

## 2019-01-11 MED ORDER — STERILE WATER FOR INJECTION IJ SOLN
INTRAMUSCULAR | Status: AC
  Administered 2019-01-11: 10 mL
  Filled 2019-01-11: qty 10

## 2019-01-11 MED ORDER — MUPIROCIN 2 % EX OINT
TOPICAL_OINTMENT | Freq: Two times a day (BID) | CUTANEOUS | Status: AC
  Administered 2019-01-11: 1 via NASAL
  Administered 2019-01-12 – 2019-01-13 (×4): via NASAL
  Filled 2019-01-11: qty 22

## 2019-01-11 MED ORDER — NOREPINEPHRINE 4 MG/250ML-% IV SOLN
0.0000 ug/min | INTRAVENOUS | Status: DC
  Administered 2019-01-11: 17:00:00 2 ug/min via INTRAVENOUS
  Administered 2019-01-13: 12 ug/min via INTRAVENOUS
  Administered 2019-01-13: 4 ug/min via INTRAVENOUS
  Administered 2019-01-14 (×3): 18 ug/min via INTRAVENOUS
  Administered 2019-01-15 (×2): 19 ug/min via INTRAVENOUS
  Administered 2019-01-15: 4 ug/min via INTRAVENOUS
  Administered 2019-01-15: 19 ug/min via INTRAVENOUS
  Filled 2019-01-11 (×2): qty 250
  Filled 2019-01-11: qty 500
  Filled 2019-01-11 (×9): qty 250

## 2019-01-11 NOTE — Progress Notes (Signed)
Spoke with Kim Skeeters RN re d/c CVC order. Pt is currently prone, plan to d/c once supine later this shift.

## 2019-01-11 NOTE — Progress Notes (Signed)
Patient's head repositioned and arms rotated.  Patient tolerated well.  No skin breakdown noted at this time.    

## 2019-01-11 NOTE — Progress Notes (Signed)
Pt belongings at bedside: pair of black glasses, hairbrush and comb,and 3 inhalers. Etta Quill ,RN

## 2019-01-11 NOTE — Progress Notes (Addendum)
Patient placed in supine position.  ETT re-secured with commercial tube holder.  Small area of pink noted on each cheek.  RN aware.  Patient tolerated well with no complications.

## 2019-01-11 NOTE — Progress Notes (Addendum)
NAME:  Kim Harrison, MRN:  [id number], DOB:  1955-03-10, LOS: 51 ADMISSION DATE:  12/22/2018, CONSULTATION DATE: 11/30 REFERRING MD: Dr. Sherral Hammers, CHIEF COMPLAINT: Shortness of breath  Brief History   63 year old female admitted 11/9 with a 10-day history of shortness of breath.  Outpatient testing for Covid was initially negative, then positive on 10/30. As an outpatient she completed 2 rounds of antibiotics and steroids. She presented to [facility] on 11/9 after a syncopal episode and was found to be COVID-19 positive.  After transfer to [facility] the patient was treated with steroids, remdesivir, convalescent plasma and 2 doses of tocilizumab.  She required high flow oxygen.  Follow-up CTA imaging of the chest revealed bilateral groundglass opacities, septal thickening and no PE.  Course notable for worsening on 11/27 with hypotension and worsening of left lower lobe infiltrate.  Cultures were obtained and the patient was treated for suspected superimposed bacterial pneumonia.  On 11/30 she decompensated requiring intubation.   Past Medical History  Sarcoidosis Hypothyroidism Dyslipidemia Anxiety Cervical Cancer   Significant Hospital Events   11/09 Admit 11/14 Transfer to ICU  11/17 Transfer to PCU 11/27 Worsening of hypoxemic respiratory failure 11/29 Transfer back to ICU for worsening hypoxemia  11/30 Decompensation requiring intubation, paralytics 12/01 Prone positioning, acidosis overnight 12/02 New AF, later RVR in pm.  Prone held.  RBBB on EKG. Bedside ECHO w/o RV fx/septal bowing 12/04 Prone positioning 12/06 Prone held due to facial swelling  12/07 Prone positioning held due to significant facial swelling. Peak 34, plat 34 FiO2 100, peep 16 12/09 Resume prone positioning, P/F ratio 118 12/11 Emesis early am while prone, TF held. Weaning vasopressors  Consults:    Procedures:  ETT 11/30 >> L IJ TLC 11/30 >>  R Rad Aline 12/1 >>  RUE PICC 12/12 >>  Significant  Diagnostic Tests:   CTA Chest 11/9 >> negative for PE, interlobular septal thickening, bilateral groundglass opacities with lower lobe predominance  CTA Chest 11/14 >> negative for PE, moderate worsening of groundglass opacities and septal thickening since prior exam superimposed on changes of sarcoidosis  ECHO 11/28 >> LVEF 60 to 65%, LVH, grade 1 diastolic dysfunction, LA/RA normal size, trace MVR, mild TVR, no evidence of left ear, mildly elevated pulmonary artery systolic pressure  ECHO 99991111 >> LVEF 55-60%, normal LV function, no LVH, global RV normal systolic function, RV moderately enlarged, LA normal, RA mildly dilated, trace MVR, mild TVR, PVR trivial, mildly elevated PAS pressure  Micro Data:  SARS CoV2 11/06 >> positive MRSA PCR 11/14 >> negative  BCx2 11/28 >> negative  Antimicrobials:  Remdesivir 11/9 >> 11/14  Convalescent plasma 11/11  Toclizumab 11/11 & 11/13  Aztreonam 11/28 >> 12/4  Interim history/subjective:  Peak 39, Pplat 26, PEEP 16, fiO2 80% Remains prone Central line to be pulled  PICC placed late pm 12/11  Off vasopressors  No further emesis I/O - 2.5L UOP, net neg 2.1L on 12/11  Objective   Blood pressure 123/62, pulse 77, temperature 99.4 F (37.4 C), temperature source Oral, resp. rate 18, height 5\' 5"  (1.651 m), weight 73 kg, SpO2 100 %. CVP:  [22 mmHg] 22 mmHg  Vent Mode: PRVC FiO2 (%):  [80 %] 80 % Set Rate:  [30 bmp] 30 bmp Vt Set:  [340 mL] 340 mL PEEP:  [16 cmH20] 16 cmH20 Plateau Pressure:  [30 cmH20-38 cmH20] 32 cmH20   Intake/Output Summary (Last 24 hours) at 01/11/2019 0714 Last data filed at 01/11/2019 [id number] Gross per 24  hour  Intake 1706.19 ml  Output 1810 ml  Net -103.81 ml   Filed Weights   01/08/19 0500 01/10/19 0456 01/11/19 0500  Weight: 78.8 kg 73 kg 73 kg    Examination: General: critically ill appearing female lying in bed, prone    HEENT: MM pink/moist, ETT, oral drainage noted  Neuro: sedate  CV: s1s2 rrr, no  m/r/g PULM:  Synchronous, coarse breath sounds bilaterally  GI: soft, bsx4 active  Extremities: warm/dry, generalized 1-2+ edema  Skin: no rashes or lesions   CXR 12/12 > images personally reviewed, unchanged severe diffuse bilateral airspace opacities  Resolved Hospital Problem list   Elevated troponin from demand ischemia (preserved ER on Echo), Septic shock from HCAP, Elevated LFTs from shock  Assessment & Plan:   Acute hypoxic, hypercapnic respiratory failure with ARDS Initially treated for COVID PNA with improvement; completed remesivir, decadron.  Hx Sarcoidosis. Worsening hypoxia 11/29, tx back to ICU with intubation from probable HCAP.   Peak 39, Pplat 26, PEEP 16, fiO2 80%, driving pressure 10 P/f 128 12/11 on 100% -low Vt ventilation 4-8cc/kg -goal plateau pressure <30, driving pressure [id number] cm H2O -target PaO2 55-65, titrate PEEP/FiO2 per ARDS protocol  -if P/F ratio <150, prone therapy for 16 hours per day.  Pending post ABG, may not need to repeat prone.  -lasix 40 mg IV BID x2 doses with KCL  -VAP prevention measures  -follow intermittent CXR, ABG   PAF  RBBB Hypotension -tele monitoring  -continue ASA -PRN vasopressors for MAP >65 -continue fondaparinux for DVT prophylaxis, allergy to lovenox   Acute metabolic encephalopathy Hx of anxiety -RASS Goal: -3 with vent synchrony  -continue dilaudid + versed gtt's  -klonopin, lexapro   Emesis  Episode 12/9, 12/11 -continue TF, advance to goal as tolerated  Hypokalemia -monitor, replace as indicated  Hypernatremia -free water 145ml Q8  Macrocytic Anemia  Thrombocytopenia -trend CBC  -transfuse for Hgb <7% or active bleeding  -continue folate, thiamine, B12  Hyperglycemia Hx of hypothyroidism -synthroid   -SSI  Best practice:  Diet: TF DVT prophylaxis: fondaparinux GI prophylaxis: pepcid  Mobility: BR  Code Status: Full Code  Family Communication: Husband Shanon Brow [phone]) & daughter Selena Batten)  called 12/12 for update.  Burns Spain (daughter [phone]).  Wetzel Bjornstad (daughter [phone]).  Husband works as a Engineer, drilling and at times has limited cell signal.  Please call Wells Guiles first if he can not be reached.  Disposition: ICU   Labs    CMP Latest Ref Rng & Units 01/10/2019 01/10/2019 01/09/2019  Glucose 70 - 99 mg/dL - 130(H) -  BUN 8 - 23 mg/dL - 32(H) -  Creatinine 0.44 - 1.00 mg/dL - 0.39(L) -  Sodium 135 - 145 mmol/L 136 142 131(L)  Potassium 3.5 - 5.1 mmol/L 2.9(L) 3.4(L) 3.8  Chloride 98 - 111 mmol/L - 90(L) -  CO2 22 - 32 mmol/L - 40(H) -  Calcium 8.9 - 10.3 mg/dL - 8.6(L) -  Total Protein 6.5 - 8.1 g/dL - - -  Total Bilirubin 0.3 - 1.2 mg/dL - - -  Alkaline Phos 38 - 126 U/L - - -  AST 15 - 41 U/L - - -  ALT 0 - 44 U/L - - -    CBC Latest Ref Rng & Units 01/10/2019 01/10/2019 01/09/2019  WBC 4.0 - 10.5 K/uL - 8.9 -  Hemoglobin 12.0 - 15.0 g/dL 9.2(L) 9.2(L) 9.2(L)  Hematocrit 36.0 - 46.0 % 27.0(L) 29.6(L) 27.0(L)  Platelets 150 - 400 K/uL -  275 -    ABG    Component Value Date/Time   PHART 7.289 (L) 01/10/2019 1221   PCO2ART 76.5 (HH) 01/10/2019 1221   PO2ART 102.0 01/10/2019 1221   HCO3 36.8 (H) 01/10/2019 1221   TCO2 39 (H) 01/10/2019 1221   O2SAT 97.0 01/10/2019 1221    CBG (last 3)  Recent Labs    01/10/19 1946 01/11/19 0013 01/11/19 0343  GLUCAP 119* 127* 114*    CC Time: 30 minutes  Noe Gens, MSN, NP-C [facility] 01/11/2019, 7:14 AM   Please see [email or web address] for pager details.    PCCM attending:  63 year old female admitted in November after a 10-day worsening of shortness of breath and diagnosis of COVID-19 pneumonia.  Patient unfortunately has a prolonged hospital course.  Has currently been in the hospital for 32 days.  Patient was transferred back to the intensive care unit on 1129 with HCAP.  Overnight appears to be stable.  Off vasopressors able to decrease ventilator requirements.  BP (!)  134/58   Pulse 80   Temp 98.4 F (36.9 C) (Axillary)   Resp (!) 24   Ht 5\' 5"  (1.651 m)   Wt 73 kg   SpO2 98%   BMI 26.78 kg/m   General: Female, intubated on mechanical life support critically ill HEENT: Endotracheal tube in place, prone positioning Heart: Prone positioning, irregular on telemetry Lungs: Bilateral ventilated breath sounds, listened posteriorly as patient prone. Extremities: No significant edema  Labs reviewed Chest x-ray: Bilateral airspace disease and opacities.The patient's images have been independently reviewed by me.    Assessment: Acute hypoxemic hypercapnic respiratory failure requiring intubation mechanical ventilation secondary to COVID-19 pneumonia which initially was treated improved, remdesivir plus Decadron plus tocilizumab.  Patient has a history of sarcoidosis.  Patient developed HCAP pneumonia and required reintubation and transferred to the intensive care unit.  Cultures negative temperature curve stable.  Leukocytosis improved Septic shock secondary to above, weaning vasopressors Acute metabolic toxic encephalopathy secondary to above Significant sedation requirements, Some component of hypotension likely related to sedation Anemia, multifactorial  Plan: Patient remains in the intensive care unit for close hemodynamic respiratory support. Continue to wean from vasopressors.  She was temporarily off this morning. Monitor off antimicrobials, re-culture if temp spikes  Continue weaning from vent support  unprone later today  Repeat abg after    This patient is critically ill with multiple organ system failure; which, requires frequent high complexity decision making, assessment, support, evaluation, and titration of therapies. This was completed through the application of advanced monitoring technologies and extensive interpretation of multiple databases. During this encounter critical care time was devoted to patient care services described in this  note for 32 minutes.  Garner Nash, DO [facility] 01/11/2019 2:24 PM

## 2019-01-12 ENCOUNTER — Inpatient Hospital Stay (HOSPITAL_COMMUNITY): Payer: BC Managed Care – PPO

## 2019-01-12 LAB — POCT I-STAT 7, (LYTES, BLD GAS, ICA,H+H)
Acid-Base Excess: 16 mmol/L — ABNORMAL HIGH (ref 0.0–2.0)
Acid-Base Excess: 18 mmol/L — ABNORMAL HIGH (ref 0.0–2.0)
Bicarbonate: 45.3 mmol/L — ABNORMAL HIGH (ref 20.0–28.0)
Bicarbonate: 47 mmol/L — ABNORMAL HIGH (ref 20.0–28.0)
Calcium, Ion: 1.43 mmol/L — ABNORMAL HIGH (ref 1.15–1.40)
Calcium, Ion: 1.41 mmol/L — ABNORMAL HIGH (ref 1.15–1.40)
HCT: 29 % — ABNORMAL LOW (ref 36.0–46.0)
HCT: 30 % — ABNORMAL LOW (ref 36.0–46.0)
Hemoglobin: 9.9 g/dL — ABNORMAL LOW (ref 12.0–15.0)
Hemoglobin: 10.2 g/dL — ABNORMAL LOW (ref 12.0–15.0)
O2 Saturation: 80 %
O2 Saturation: 83 %
Patient temperature: 98.6
Patient temperature: 98.4
Potassium: 5 mmol/L (ref 3.5–5.1)
Potassium: 5 mmol/L (ref 3.5–5.1)
Sodium: 138 mmol/L (ref 135–145)
Sodium: 140 mmol/L (ref 135–145)
TCO2: 48 mmol/L — ABNORMAL HIGH (ref 22–32)
TCO2: 50 mmol/L — ABNORMAL HIGH (ref 22–32)
pCO2 arterial: 90.3 mmHg (ref 32.0–48.0)
pCO2 arterial: 87.3 mmHg (ref 32.0–48.0)
pH, Arterial: 7.308 — ABNORMAL LOW (ref 7.350–7.450)
pH, Arterial: 7.339 — ABNORMAL LOW (ref 7.350–7.450)
pO2, Arterial: 52 mmHg — ABNORMAL LOW (ref 83.0–108.0)
pO2, Arterial: 54 mmHg — ABNORMAL LOW (ref 83.0–108.0)

## 2019-01-12 LAB — CBC
HCT: 33.6 % — ABNORMAL LOW (ref 36.0–46.0)
Hemoglobin: 10.3 g/dL — ABNORMAL LOW (ref 12.0–15.0)
MCH: 32.8 pg (ref 26.0–34.0)
MCHC: 30.7 g/dL (ref 30.0–36.0)
MCV: 107 fL — ABNORMAL HIGH (ref 80.0–100.0)
Platelets: 341 10*3/uL (ref 150–400)
RBC: 3.14 MIL/uL — ABNORMAL LOW (ref 3.87–5.11)
RDW: 16.5 % — ABNORMAL HIGH (ref 11.5–15.5)
WBC: 16.7 10*3/uL — ABNORMAL HIGH (ref 4.0–10.5)
nRBC: 0 % (ref 0.0–0.2)

## 2019-01-12 LAB — BASIC METABOLIC PANEL
Anion gap: 8 (ref 5–15)
BUN: 33 mg/dL — ABNORMAL HIGH (ref 8–23)
CO2: 41 mmol/L — ABNORMAL HIGH (ref 22–32)
Calcium: 10 mg/dL (ref 8.9–10.3)
Chloride: 94 mmol/L — ABNORMAL LOW (ref 98–111)
Creatinine, Ser: 0.42 mg/dL — ABNORMAL LOW (ref 0.44–1.00)
GFR calc Af Amer: >60 mL/min (ref >60)
GFR calc non Af Amer: >60 mL/min (ref >60)
Glucose, Bld: 128 mg/dL — ABNORMAL HIGH (ref 70–99)
Potassium: 5.2 mmol/L — ABNORMAL HIGH (ref 3.5–5.1)
Sodium: 143 mmol/L (ref 135–145)

## 2019-01-12 LAB — PROCALCITONIN: Procalcitonin: 0.29 ng/mL

## 2019-01-12 LAB — GLUCOSE, CAPILLARY
Glucose-Capillary: 110 mg/dL — ABNORMAL HIGH (ref 70–99)
Glucose-Capillary: 126 mg/dL — ABNORMAL HIGH (ref 70–99)
Glucose-Capillary: 156 mg/dL — ABNORMAL HIGH (ref 70–99)
Glucose-Capillary: 135 mg/dL — ABNORMAL HIGH (ref 70–99)

## 2019-01-12 LAB — POTASSIUM: Potassium: 5.2 mmol/L — ABNORMAL HIGH (ref 3.5–5.1)

## 2019-01-12 MED ORDER — FUROSEMIDE 10 MG/ML IJ SOLN
40.0000 mg | Freq: Two times a day (BID) | INTRAMUSCULAR | Status: DC
  Administered 2019-01-12: 40 mg via INTRAVENOUS
  Filled 2019-01-12: qty 4

## 2019-01-12 MED ORDER — POTASSIUM CHLORIDE 20 MEQ/15ML (10%) PO SOLN
40.0000 meq | Freq: Once | ORAL | Status: AC
  Administered 2019-01-12: 12:00:00 40 meq
  Filled 2019-01-12: qty 30

## 2019-01-12 NOTE — Progress Notes (Signed)
Patient's head repositioned and arms rotated.  Patient tolerated well.  No skin breakdown noted at this time.    

## 2019-01-12 NOTE — Progress Notes (Signed)
Pt's head turned at this time w/o event. 

## 2019-01-12 NOTE — Progress Notes (Signed)
CRITICAL VALUE ALERT  Critical Value: Called from [facility], notifying pt's PICC is malpositioned (read CXR report).  Date & Time Notied: 01/12/2019  @0905   Provider Notified: Noe Gens, NP at bedside  Orders Received/Actions taken: Pt is proned.Marland KitchenMarland KitchenNP will follow up with radiology

## 2019-01-12 NOTE — Progress Notes (Signed)
Pt proned at this time after taping tube at 25 @ lip with cloth tape. No complications to note.

## 2019-01-12 NOTE — Progress Notes (Signed)
Patient placed in supine position.  ETT re-secured with commercial tube holder.  No breakdown noted.  Patient tolerated well with no complications.

## 2019-01-12 NOTE — Progress Notes (Signed)
Assisted family with video and camera time via elink

## 2019-01-12 NOTE — Progress Notes (Addendum)
NAME:  Kim Harrison, MRN:  [id number], DOB:  Apr 13, 1955, LOS: 43 ADMISSION DATE:  12/28/2018, CONSULTATION DATE: 11/30 REFERRING MD: Dr. Sherral Hammers, CHIEF COMPLAINT: Shortness of breath  Brief History   63 year old female admitted 11/9 with a 10-day history of shortness of breath.  Outpatient testing for Covid was initially negative, then positive on 10/30. As an outpatient she completed 2 rounds of antibiotics and steroids. She presented to [facility] on 11/9 after a syncopal episode and was found to be COVID-19 positive.  After transfer to [facility] the patient was treated with steroids, remdesivir, convalescent plasma and 2 doses of tocilizumab.  She required high flow oxygen.  Follow-up CTA imaging of the chest revealed bilateral groundglass opacities, septal thickening and no PE.  Course notable for worsening on 11/27 with hypotension and worsening of left lower lobe infiltrate.  Cultures were obtained and the patient was treated for suspected superimposed bacterial pneumonia.  On 11/30 she decompensated requiring intubation.   Past Medical History  Sarcoidosis Hypothyroidism Dyslipidemia Anxiety Cervical Cancer   Significant Hospital Events   11/09 Admit 11/14 Transfer to ICU  11/17 Transfer to PCU 11/27 Worsening of hypoxemic respiratory failure 11/29 Transfer back to ICU for worsening hypoxemia  11/30 Decompensation requiring intubation, paralytics 12/01 Prone positioning, acidosis overnight 12/02 New AF, later RVR in pm.  Prone held.  RBBB on EKG. Bedside ECHO w/o RV fx/septal bowing 12/04 Prone positioning 12/06 Prone held due to facial swelling  12/07 Prone positioning held due to significant facial swelling. Peak 34, plat 34 FiO2 100, peep 16 12/09 Resume prone positioning, P/F ratio 118 12/11 Emesis early am while prone, TF held. Weaning vasopressors 12/12 Off vasopressors most of day, back on with sedation  12/13 Off vasopressors   Consults:    Procedures:  ETT  11/30 >> L IJ TLC 11/30 >>  R Rad Aline 12/1 >>  RUE PICC 12/12 >>  Significant Diagnostic Tests:   CTA Chest 11/9 >> negative for PE, interlobular septal thickening, bilateral groundglass opacities with lower lobe predominance  CTA Chest 11/14 >> negative for PE, moderate worsening of groundglass opacities and septal thickening since prior exam superimposed on changes of sarcoidosis  ECHO 11/28 >> LVEF 60 to 65%, LVH, grade 1 diastolic dysfunction, LA/RA normal size, trace MVR, mild TVR, no evidence of left ear, mildly elevated pulmonary artery systolic pressure  ECHO [id number] >> LVEF 55-60%, normal LV function, no LVH, global RV normal systolic function, RV moderately enlarged, LA normal, RA mildly dilated, trace MVR, mild TVR, PVR trivial, mildly elevated PAS pressure  Micro Data:  SARS CoV2 11/06 >> positive MRSA PCR 11/14 >> negative  BCx2 11/28 >> negative  Antimicrobials:  Remdesivir 11/9 >> 11/14  Convalescent plasma 11/11  Toclizumab 11/11 & 11/13  Aztreonam 11/28 >> 12/4  Interim history/subjective:  Peak 40, Pplat 36, PEEP 16, fiO2 80% I/O - 2.6L UOP, neg 1.5L in 24h Tmax 99 WBC increased to 16.7 (from 9.6)  Objective   Blood pressure (!) 115/49, pulse 88, temperature 98.8 F (37.1 C), temperature source Oral, resp. rate (!) 30, height 5\' 5"  (1.651 m), weight 79.8 kg, SpO2 94 %.    Vent Mode: PRVC FiO2 (%):  [80 %] 80 % Set Rate:  [30 bmp] 30 bmp Vt Set:  [340 mL] 340 mL PEEP:  [16 cmH20] 16 cmH20 Plateau Pressure:  [18 cmH20-37 cmH20] 29 cmH20   Intake/Output Summary (Last 24 hours) at 01/12/2019 0747 Last data filed at 01/12/2019 0736 Gross per 24  hour  Intake 1068.87 ml  Output 2875 ml  Net -1806.13 ml   Filed Weights   01/10/19 0456 01/11/19 0500 01/12/19 0500  Weight: 73 kg 73 kg 79.8 kg    Examination: General: critically ill appearing adult female lying on vent in NAD, prone position HEENT: MM pink/moist, ETT, facial edema  Neuro: sedate CV:  s1s2 RRR, no m/r/g PULM:  Non-labored on vent, synchronous, lungs with coarse rhonchi bilaterally  GI: soft, bsx4 active  Extremities: warm/dry, generalized 2+ pitting edema  Skin: no rashes or lesions  CXR 12/13 > images personally reviewed, ETT in good position, unchanged bilateral airspace opacities  Resolved Hospital Problem list   Elevated troponin from demand ischemia (preserved ER on Echo), Septic shock from HCAP, Elevated LFTs from shock  Assessment & Plan:   Acute hypoxic, hypercapnic respiratory failure with ARDS Initially treated for COVID PNA with improvement; completed remesivir, decadron.  Hx Sarcoidosis. Worsening hypoxia 11/29, tx back to ICU with intubation from probable HCAP.   -low Vt ventilation 4-8cc/kg > increase to 7cc/kg -goal plateau pressure <30, driving pressure [id number] cm H2O -target PaO2 55-65, titrate PEEP/FiO2 per ARDS protocol  -hold prone therapy pm 12/13, reassess face in am  -goal CVP <4, lasix 40 mg BID x2 -VAP prevention measures  -follow intermittent CXR   PAF  RBBB Hypotension -tele monitoring  -ASA -levophed as needed for MAP >65 -fondaparinux for DVT prophylaxis with allergy to lovenox   Acute metabolic encephalopathy Hx of anxiety -RASS Goal: -2 to -3  -dilaudid + versed gtt's -lowered ceiling on versed gtt  -klonopin, lexapro   Leukocytosis  -assess PCT -follow fever curve / WBC trend  -hold abx for now, if new fever would add broad spectrum  -central line removed 12/12, aline 12/13.  No change in CXR.   Emesis  Episode 12/9, 12/11, KUB negative -TF as tolerated  Hypokalemia -monitor, replace as indicated   Hypernatremia -free water PT  Macrocytic Anemia  Thrombocytopenia -trend CBC -transfuse for Hgb <7% or active bleeding  -continue folate, thiamine, B12  Hyperglycemia Hx of hypothyroidism -synthroid  -SSI  Best practice:  Diet: TF DVT prophylaxis: fondaparinux GI prophylaxis: pepcid  Mobility: BR  Code  Status: Full Code  Family Communication: Husband Shanon Brow [phone]) called 12/13 for update.  Burns Spain (daughter [phone]).  Wetzel Bjornstad (daughter [phone]).  Husband works as a Engineer, drilling and at times has limited cell signal.  Please call Wells Guiles first if he can not be reached.  Disposition: ICU   Labs    CMP Latest Ref Rng & Units 01/11/2019 01/11/2019 01/10/2019  Glucose 70 - 99 mg/dL - 135(H) -  BUN 8 - 23 mg/dL - 35(H) -  Creatinine 0.44 - 1.00 mg/dL - 0.40(L) -  Sodium 135 - 145 mmol/L 138 138 136  Potassium 3.5 - 5.1 mmol/L 4.5 3.9 2.9(L)  Chloride 98 - 111 mmol/L - 92(L) -  CO2 22 - 32 mmol/L - 40(H) -  Calcium 8.9 - 10.3 mg/dL - 9.3 -  Total Protein 6.5 - 8.1 g/dL - - -  Total Bilirubin 0.3 - 1.2 mg/dL - - -  Alkaline Phos 38 - 126 U/L - - -  AST 15 - 41 U/L - - -  ALT 0 - 44 U/L - - -    CBC Latest Ref Rng & Units 01/11/2019 01/11/2019 01/10/2019  WBC 4.0 - 10.5 K/uL - 9.6 -  Hemoglobin 12.0 - 15.0 g/dL 8.8(L) 9.2(L) 9.2(L)  Hematocrit 36.0 - 46.0 %  26.0(L) 30.4(L) 27.0(L)  Platelets 150 - 400 K/uL - 301 -    ABG    Component Value Date/Time   PHART 7.233 (L) 01/11/2019 1712   PCO2ART 87.3 (HH) 01/11/2019 1712   PO2ART 66.0 (L) 01/11/2019 1712   HCO3 37.1 (H) 01/11/2019 1712   TCO2 40 (H) 01/11/2019 1712   O2SAT 88.0 01/11/2019 1712    CBG (last 3)  Recent Labs    01/11/19 1633 01/11/19 2346 01/12/19 0339  GLUCAP 114* 155* 110*    CC Time: 30 minutes  Noe Gens, MSN, NP-C [facility] 01/12/2019, 7:47 AM   Please see [email or web address] for pager details.     Pulmonary critical care attending:  This is a 64 year old female with acute hypoxemic hypercapnic respiratory failure secondary to COVID-19 bilateral pneumonia and ARDS intubated in the intensive care unit.  Overnight oxygen needs are improving some.  Patient also remains successfully proned.  Patient was rebound in the middle of the night.  BP (!) 86/55    Pulse 89   Temp 98.7 F (37.1 C) (Oral)   Resp (!) 30   Ht 5\' 5"  (1.651 m)   Wt 79.8 kg   SpO2 91%   BMI 29.28 kg/m   General: Female, intubated on mechanical life support critically ill HEENT: Facial swelling, endotracheal tube in place Heart: Regular rate rhythm, S1-S2 Lungs: Bilateral ventilated breath sounds Extremities: Some dependent edema.  Labs: Reviewed Chest x-ray: Reviewed, this is a prone film.  The PICC line appears in appropriate position. The patient's images have been independently reviewed by me.    Assessment: Acute hypoxemic hypercapnic respiratory failure secondary to COVID-19 bilateral pneumonia development of ARDS. Paroxysmal atrial fibrillation Shock state, resolved Acute metabolic encephalopathy secondary to above Hypokalemia Hypernatremia Anemia, multifactorial Hyperglycemia  Plan: Patient remains in the ICU for close hemodynamic respiratory support. Currently weaned off of vasopressors Continue to monitor off antimicrobials, reculture if there is evidence of infection or new fevers. Continue to wean from mechanical support. Continue mechanical ventilation per ARDS protocol Target TVol 6-8cc/kgIBW Target Plateau Pressure < 30cm H20 Target driving pressure less than 15 cm of water Target PaO2 55-65: titrate PEEP/FiO2 per protocol As long as PaO2 to FiO2 ratio is less than 1:150 position in prone position for 16 hours a day Check CVP daily if CVL in place Target CVP less than 4, diurese as necessary Ventilator associated pneumonia prevention protocol Plan to supine later this evening.  Likely leave her supine and readdress facial swelling in the morning before making a decision if need for repeat proning.  Would like to avoid facial skin breakdown.  This patient is critically ill with multiple organ system failure; which, requires frequent high complexity decision making, assessment, support, evaluation, and titration of therapies. This was  completed through the application of advanced monitoring technologies and extensive interpretation of multiple databases. During this encounter critical care time was devoted to patient care services described in this note for 32 minutes.   [facility] 01/12/2019 3:29 PM

## 2019-01-13 ENCOUNTER — Other Ambulatory Visit: Payer: Self-pay

## 2019-01-13 LAB — CBC
HCT: 31.5 % — ABNORMAL LOW (ref 36.0–46.0)
Hemoglobin: 9.5 g/dL — ABNORMAL LOW (ref 12.0–15.0)
MCH: 32.5 pg (ref 26.0–34.0)
MCHC: 30.2 g/dL (ref 30.0–36.0)
MCV: 107.9 fL — ABNORMAL HIGH (ref 80.0–100.0)
Platelets: 342 10*3/uL (ref 150–400)
RBC: 2.92 MIL/uL — ABNORMAL LOW (ref 3.87–5.11)
RDW: 17.6 % — ABNORMAL HIGH (ref 11.5–15.5)
WBC: 26.3 10*3/uL — ABNORMAL HIGH (ref 4.0–10.5)
nRBC: 0.1 % (ref 0.0–0.2)

## 2019-01-13 LAB — GLUCOSE, CAPILLARY
Glucose-Capillary: 120 mg/dL — ABNORMAL HIGH (ref 70–99)
Glucose-Capillary: 122 mg/dL — ABNORMAL HIGH (ref 70–99)
Glucose-Capillary: 131 mg/dL — ABNORMAL HIGH (ref 70–99)
Glucose-Capillary: 134 mg/dL — ABNORMAL HIGH (ref 70–99)
Glucose-Capillary: 136 mg/dL — ABNORMAL HIGH (ref 70–99)
Glucose-Capillary: 140 mg/dL — ABNORMAL HIGH (ref 70–99)
Glucose-Capillary: 151 mg/dL — ABNORMAL HIGH (ref 70–99)
Glucose-Capillary: 156 mg/dL — ABNORMAL HIGH (ref 70–99)
Glucose-Capillary: 172 mg/dL — ABNORMAL HIGH (ref 70–99)

## 2019-01-13 LAB — BASIC METABOLIC PANEL
Anion gap: 5 (ref 5–15)
BUN: 38 mg/dL — ABNORMAL HIGH (ref 8–23)
CO2: 44 mmol/L — ABNORMAL HIGH (ref 22–32)
Calcium: 10.1 mg/dL (ref 8.9–10.3)
Chloride: 95 mmol/L — ABNORMAL LOW (ref 98–111)
Creatinine, Ser: 0.58 mg/dL (ref 0.44–1.00)
GFR calc Af Amer: >60 mL/min (ref >60)
GFR calc non Af Amer: >60 mL/min (ref >60)
Glucose, Bld: 139 mg/dL — ABNORMAL HIGH (ref 70–99)
Potassium: 5.2 mmol/L — ABNORMAL HIGH (ref 3.5–5.1)
Sodium: 144 mmol/L (ref 135–145)

## 2019-01-13 LAB — PROCALCITONIN: Procalcitonin: 1.35 ng/mL

## 2019-01-13 MED ORDER — AMIODARONE HCL IN DEXTROSE 360-4.14 MG/200ML-% IV SOLN
30.0000 mg/h | INTRAVENOUS | Status: DC
  Administered 2019-01-13 – 2019-01-17 (×7): 30 mg/h via INTRAVENOUS
  Filled 2019-01-13 (×8): qty 200

## 2019-01-13 MED ORDER — AMIODARONE LOAD VIA INFUSION
150.0000 mg | Freq: Once | INTRAVENOUS | Status: AC
  Administered 2019-01-13: 150 mg via INTRAVENOUS
  Filled 2019-01-13: qty 83.34

## 2019-01-13 MED ORDER — VANCOMYCIN HCL 10 G IV SOLR
1500.0000 mg | Freq: Once | INTRAVENOUS | Status: AC
  Administered 2019-01-13: 13:00:00 1500 mg via INTRAVENOUS
  Filled 2019-01-13: qty 1000

## 2019-01-13 MED ORDER — VANCOMYCIN HCL IN DEXTROSE 1-5 GM/200ML-% IV SOLN
1000.0000 mg | Freq: Two times a day (BID) | INTRAVENOUS | Status: DC
  Administered 2019-01-14 – 2019-01-16 (×7): 1000 mg via INTRAVENOUS
  Filled 2019-01-13 (×8): qty 200

## 2019-01-13 MED ORDER — SODIUM CHLORIDE 0.9 % IV SOLN
2.0000 g | Freq: Three times a day (TID) | INTRAVENOUS | Status: DC
  Administered 2019-01-13 – 2019-01-15 (×6): 2 g via INTRAVENOUS
  Filled 2019-01-13 (×7): qty 2

## 2019-01-13 MED ORDER — CIPROFLOXACIN IN D5W 400 MG/200ML IV SOLN
400.0000 mg | Freq: Three times a day (TID) | INTRAVENOUS | Status: DC
  Administered 2019-01-13 – 2019-01-15 (×6): 400 mg via INTRAVENOUS
  Filled 2019-01-13 (×7): qty 200

## 2019-01-13 MED ORDER — AMIODARONE HCL IN DEXTROSE 360-4.14 MG/200ML-% IV SOLN
60.0000 mg/h | INTRAVENOUS | Status: AC
  Administered 2019-01-13: 60 mg/h via INTRAVENOUS
  Filled 2019-01-13: qty 200

## 2019-01-13 NOTE — Progress Notes (Signed)
Assisted family with video/camera time via elink 

## 2019-01-13 NOTE — Progress Notes (Addendum)
NAME:  Kim Harrison, MRN:  [id number], DOB:  Apr 03, 1955, LOS: 65 ADMISSION DATE:  12/20/2018, CONSULTATION DATE: 11/30 REFERRING MD: Dr. Sherral Hammers, CHIEF COMPLAINT: Shortness of breath  Brief History   63 year old female admitted 11/9 with a 10-day history of shortness of breath.  Outpatient testing for Covid was initially negative, then positive on 10/30. As an outpatient she completed 2 rounds of antibiotics and steroids. She presented to [facility] on 11/9 after a syncopal episode and was found to be COVID-19 positive.  After transfer to [facility] the patient was treated with steroids, remdesivir, convalescent plasma and 2 doses of tocilizumab.  She required high flow oxygen.  Follow-up CTA imaging of the chest revealed bilateral groundglass opacities, septal thickening and no PE.  Course notable for worsening on 11/27 with hypotension and worsening of left lower lobe infiltrate.  Cultures were obtained and the patient was treated for suspected superimposed bacterial pneumonia.  On 11/30 she decompensated requiring intubation.   Past Medical History  Sarcoidosis Hypothyroidism Dyslipidemia Anxiety Cervical Cancer   Significant Hospital Events   11/09 Admit 11/14 Transfer to ICU  11/17 Transfer to PCU 11/27 Worsening of hypoxemic respiratory failure 11/29 Transfer back to ICU for worsening hypoxemia  11/30 Decompensation requiring intubation, paralytics 12/01 Prone positioning, acidosis overnight 12/02 New AF, later RVR in pm.  Prone held.  RBBB on EKG. Bedside ECHO w/o RV fx/septal bowing 12/04 Prone positioning 12/06 Prone held due to facial swelling  12/07 Prone positioning held due to significant facial swelling. Peak 34, plat 34 FiO2 100, peep 16 12/09 Resume prone positioning, P/F ratio 118 12/11 Emesis early am while prone, TF held. Weaning vasopressors 12/12 Off vasopressors most of day, back on with sedation  12/13 Off vasopressors   Consults:    Procedures:  ETT  11/30 >> L IJ TLC 11/30 >>  R Rad Aline 12/1 >>  RUE PICC 12/12 >>  Significant Diagnostic Tests:   CTA Chest 11/9 >> negative for PE, interlobular septal thickening, bilateral groundglass opacities with lower lobe predominance  CTA Chest 11/14 >> negative for PE, moderate worsening of groundglass opacities and septal thickening since prior exam superimposed on changes of sarcoidosis  ECHO 11/28 >> LVEF 60 to 65%, LVH, grade 1 diastolic dysfunction, LA/RA normal size, trace MVR, mild TVR, no evidence of left ear, mildly elevated pulmonary artery systolic pressure  ECHO 99991111 >> LVEF 55-60%, normal LV function, no LVH, global RV normal systolic function, RV moderately enlarged, LA normal, RA mildly dilated, trace MVR, mild TVR, PVR trivial, mildly elevated PAS pressure  Micro Data:  SARS CoV2 11/06 >> positive MRSA PCR 11/14 >> negative  BCx2 11/28 >> negative Sputum 12/14>>>  Antimicrobials:  Remdesivir 11/9 >> 11/14  Convalescent plasma 11/11  Toclizumab 11/11 & 11/13  Aztreonam 11/28 >> 12/4 azactam 12/14>>> vanc 12/14>>> cipro 12/14>>>  Interim history/subjective:   Objective   Blood pressure (Abnormal) 94/54, pulse (Abnormal) 106, temperature 99.5 F (37.5 C), resp. rate (Abnormal) 30, height 5\' 5"  (1.651 m), weight 80.6 kg, SpO2 90 %.    Vent Mode: PRVC FiO2 (%):  [70 %-100 %] 100 % Set Rate:  [30 bmp] 30 bmp Vt Set:  [340 mL-400 mL] 400 mL PEEP:  [14 cmH20] 14 cmH20 Plateau Pressure:  [26 cmH20-37 cmH20] 33 cmH20   Intake/Output Summary (Last 24 hours) at 01/13/2019 0913 Last data filed at 01/13/2019 0900 Gross per 24 hour  Intake 966.43 ml  Output 2045 ml  Net -1078.57 ml   Danley Danker  Weights   01/11/19 0500 01/12/19 0500 01/13/19 0458  Weight: 73 kg 79.8 kg 80.6 kg    Examination:  General 63 year old white female currently sedated on mechanical ventilation HEENT normocephalic atraumatic no jugular venous distention orally intubated pulm equal chest rise,  bilateral rales Card RRR no MRG abd hypoactive Ext no sig edema brisk CR Neuro sedated  Resolved Hospital Problem list   Elevated troponin from demand ischemia (preserved ER on Echo), Septic shock from HCAP, Elevated LFTs from shock Hypernatremia  Assessment & Plan:   Acute hypoxic, hypercapnic respiratory failure with ARDS: felt probable HCAP superimposed on recent COVID PNA and underlying Sarcoid Plan Cont ARDS protocol vt 6-8 ml/kg pplat goal < 30 Driving pressures < 15 pao2 goal 55-65  Prone therapy      PAF  RBBB Hypotension Plan Cont asa  Cont fondaparinux for dvt prophylaxis   Acute metabolic encephalopathy Hx of anxiety Plan  Leukocytosis  -spiked fever last night. WBC ct cont to climb -lines already removed.  -PCT rising  Plan Start broad spec abx Day 1 vanc/azactam and cipro  Emesis  Episode 12/9, 12/11, KUB negative Plan Tube feeds as tolerated.   Hyperkalemia Plan Lokalema    Macrocytic Anemia  Thrombocytopenia Plan Cont thiamine and folate Trend cbc tranfuse for hgb < 7   Hyperglycemia Hx of hypothyroidism Plan Cont synthroid and ssi   Best practice:  Diet: TF DVT prophylaxis: fondaparinux GI prophylaxis: pepcid  Mobility: BR  Code Status: Full Code  Family Communication: Husband Shanon Brow [phone]) called 12/13 for update.  Burns Spain (daughter [phone]).  Wetzel Bjornstad (daughter [phone]).  Husband works as a Engineer, drilling and at times has limited cell signal.  Please call Wells Guiles first if he can not be reached.  Disposition: ICU    My cct 35 min Erick Colace ACNP-BC [place] Pager # [phone] OR # [phone] if no answer

## 2019-01-13 NOTE — Progress Notes (Signed)
Pharmacy Antibiotic Note  Kim Harrison is a 63 y.o. female admitted on 12/05/2018 with Covid-19 pneumonia.  She previously completed a course of Aztreonam for suspected pneumonia.  Pharmacy has been consulted for Vancomycin, Aztreonam, Cipro dosing.  PCT, WBC and fever are elevated.  Double cover for pseudomonas given resistance patterns in previous [facility] ICU cases. PCN, cephalosporin allergy (anaphylaxis).    Plan: Aztreonam 2g IV q8h Vancomycin 1500mg  IV x1 then 1000 mg q12h. Measure Vanc peak and trough at steady state. Goal AUC = 400 - 550.  Expected AUC 458 using SCr 0.58 Follow up renal function, culture results, and clinical course.   Height: 5\' 5"  (165.1 cm) Weight: 177 lb 11.1 oz (80.6 kg) IBW/kg (Calculated) : 57  Temp (24hrs), Avg:99.5 F (37.5 C), Min:98.6 F (37 C), Max:101.8 F (38.8 C)  Recent Labs  Lab 01/09/19 0430 01/10/19 0455 01/11/19 0549 01/12/19 0637 01/13/19 0514  WBC 8.4 8.9 9.6 16.7* 26.3*  CREATININE 0.42* 0.39* 0.40* 0.42* 0.58    Estimated Creatinine Clearance: 74.5 mL/min (by C-G formula based on SCr of 0.58 mg/dL).    Allergies  Allergen Reactions  . 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  . Cephalexin Anaphylaxis  . Penicillins Anaphylaxis    Did it involve swelling of the face/tongue/throat, SOB, or low BP? Yes Did it involve sudden or severe rash/hives, skin peeling, or any reaction on the inside of your mouth or nose? No Did you need to seek medical attention at a hospital or doctor's office? Yes When did it last happen?couple of years ago If all above answers are "NO", may proceed with cephalosporin use.   Marland Kitchen Beef-Derived Products   . Pork-Derived Products   . Sulfonamide Derivatives Hives    Acetazolamide ok    Antimicrobials this admission: 11/11 Remdesivir >> 11/14 11/28 Aztreonam >> 12/4, resumed 12/14 >>  12/14 Vancomycin >>  12/14 Cipro >>   Dose adjustments this  admission:  Microbiology results: 11/14 MRSA PCR: neg 11/28 BCx: NGF 12/12 MRSA PCR: positive   Thank you for allowing pharmacy to be a part of this patient's care.  Gretta Arab PharmD, BCPS Clinical pharmacist phone 7am- 5pm: [phone] 01/13/2019 10:40 AM

## 2019-01-13 NOTE — Progress Notes (Signed)
New onset of afib for this shift, EKG done, Dr. Lake Bells notified and ordered to give bolus Amio and start drip.

## 2019-01-13 NOTE — Progress Notes (Signed)
Husband updated.   Erick Colace ACNP-BC [place] Pager # [phone] OR # [phone] if no answer

## 2019-01-14 ENCOUNTER — Inpatient Hospital Stay (HOSPITAL_COMMUNITY): Payer: BC Managed Care – PPO

## 2019-01-14 LAB — BASIC METABOLIC PANEL
Anion gap: 11 (ref 5–15)
BUN: 28 mg/dL — ABNORMAL HIGH (ref 8–23)
CO2: 33 mmol/L — ABNORMAL HIGH (ref 22–32)
Calcium: 8.1 mg/dL — ABNORMAL LOW (ref 8.9–10.3)
Chloride: 98 mmol/L (ref 98–111)
Creatinine, Ser: 0.42 mg/dL — ABNORMAL LOW (ref 0.44–1.00)
GFR calc Af Amer: >60 mL/min (ref >60)
GFR calc non Af Amer: >60 mL/min (ref >60)
Glucose, Bld: 248 mg/dL — ABNORMAL HIGH (ref 70–99)
Potassium: 3.7 mmol/L (ref 3.5–5.1)
Sodium: 142 mmol/L (ref 135–145)

## 2019-01-14 LAB — POCT I-STAT 7, (LYTES, BLD GAS, ICA,H+H)
Acid-Base Excess: 17 mmol/L — ABNORMAL HIGH (ref 0.0–2.0)
Bicarbonate: 45.9 mmol/L — ABNORMAL HIGH (ref 20.0–28.0)
Calcium, Ion: 1.34 mmol/L (ref 1.15–1.40)
HCT: 31 % — ABNORMAL LOW (ref 36.0–46.0)
Hemoglobin: 10.5 g/dL — ABNORMAL LOW (ref 12.0–15.0)
O2 Saturation: 86 %
Patient temperature: 99
Potassium: 4 mmol/L (ref 3.5–5.1)
Sodium: 137 mmol/L (ref 135–145)
TCO2: 48 mmol/L — ABNORMAL HIGH (ref 22–32)
pCO2 arterial: 87.4 mmHg (ref 32.0–48.0)
pH, Arterial: 7.33 — ABNORMAL LOW (ref 7.350–7.450)
pO2, Arterial: 61 mmHg — ABNORMAL LOW (ref 83.0–108.0)

## 2019-01-14 LAB — GLUCOSE, CAPILLARY
Glucose-Capillary: 195 mg/dL — ABNORMAL HIGH (ref 70–99)
Glucose-Capillary: 167 mg/dL — ABNORMAL HIGH (ref 70–99)
Glucose-Capillary: 129 mg/dL — ABNORMAL HIGH (ref 70–99)
Glucose-Capillary: 145 mg/dL — ABNORMAL HIGH (ref 70–99)

## 2019-01-14 LAB — MAGNESIUM: Magnesium: 1.7 mg/dL (ref 1.7–2.4)

## 2019-01-14 LAB — PROCALCITONIN: Procalcitonin: 1.4 ng/mL

## 2019-01-14 MED ORDER — MIDAZOLAM 50MG/50ML (1MG/ML) PREMIX INFUSION
2.0000 mg/h | INTRAVENOUS | Status: DC
  Administered 2019-01-14: 5 mg/h via INTRAVENOUS
  Administered 2019-01-14: 4 mg/h via INTRAVENOUS
  Administered 2019-01-15 (×2): 5 mg/h via INTRAVENOUS
  Administered 2019-01-16: 6 mg/h via INTRAVENOUS
  Administered 2019-01-16: 5 mg/h via INTRAVENOUS
  Administered 2019-01-17: 13:00:00 7 mg/h via INTRAVENOUS
  Administered 2019-01-17: 4 mg/h via INTRAVENOUS
  Filled 2019-01-14 (×7): qty 50

## 2019-01-14 MED ORDER — FUROSEMIDE 10 MG/ML IJ SOLN
80.0000 mg | Freq: Two times a day (BID) | INTRAMUSCULAR | Status: AC
  Administered 2019-01-14 (×2): 80 mg via INTRAVENOUS
  Filled 2019-01-14 (×2): qty 8

## 2019-01-14 MED ORDER — HYDROMORPHONE BOLUS VIA INFUSION
0.5000 mg | INTRAVENOUS | Status: DC | PRN
  Administered 2019-01-17: 0.5 mg via INTRAVENOUS
  Filled 2019-01-14: qty 1

## 2019-01-14 MED ORDER — ARTIFICIAL TEARS OPHTHALMIC OINT
1.0000 "application " | TOPICAL_OINTMENT | Freq: Three times a day (TID) | OPHTHALMIC | Status: DC
  Administered 2019-01-14 – 2019-01-17 (×9): 1 via OPHTHALMIC
  Filled 2019-01-14 (×2): qty 3.5

## 2019-01-14 MED ORDER — MIDAZOLAM BOLUS VIA INFUSION
1.0000 mg | INTRAVENOUS | Status: DC | PRN
  Administered 2019-01-17: 2 mg via INTRAVENOUS
  Filled 2019-01-14: qty 2

## 2019-01-14 MED ORDER — STERILE WATER FOR INJECTION IJ SOLN
INTRAMUSCULAR | Status: AC
  Administered 2019-01-14: 10 mL
  Filled 2019-01-14: qty 10

## 2019-01-14 MED ORDER — METOPROLOL TARTRATE 5 MG/5ML IV SOLN
5.0000 mg | INTRAVENOUS | Status: DC | PRN
  Administered 2019-01-14 – 2019-01-15 (×2): 5 mg via INTRAVENOUS
  Filled 2019-01-14 (×2): qty 5

## 2019-01-14 MED ORDER — SODIUM CHLORIDE 0.9 % IV SOLN
1.0000 mg/h | INTRAVENOUS | Status: DC
  Administered 2019-01-14 – 2019-01-15 (×2): 1.5 mg/h via INTRAVENOUS
  Administered 2019-01-17: 1 mg/h via INTRAVENOUS
  Filled 2019-01-14 (×2): qty 5

## 2019-01-14 MED ORDER — SODIUM CHLORIDE 0.9 % IV SOLN
0.0000 ug/kg/min | INTRAVENOUS | Status: DC
  Administered 2019-01-14 – 2019-01-17 (×5): 3 ug/kg/min via INTRAVENOUS
  Filled 2019-01-14 (×6): qty 20

## 2019-01-14 NOTE — Progress Notes (Signed)
Small bruising noted to right upper cheek/lower eye area. Small skin tear to left cheek. Laurey Arrow NP notified.  RT will continue to monitor.

## 2019-01-14 NOTE — Progress Notes (Signed)
NAME:  Kim Harrison, MRN:  [id number], DOB:  Jun 23, 1955, LOS: 16 ADMISSION DATE:  12/22/2018, CONSULTATION DATE: 11/30 REFERRING MD: Dr. Sherral Hammers, CHIEF COMPLAINT: Shortness of breath  Brief History   63 year old female admitted 11/9 with a 10-day history of shortness of breath.  Outpatient testing for Covid was initially negative, then positive on 10/30. As an outpatient she completed 2 rounds of antibiotics and steroids. She presented to [facility] on 11/9 after a syncopal episode and was found to be COVID-19 positive.  After transfer to [facility] the patient was treated with steroids, remdesivir, convalescent plasma and 2 doses of tocilizumab.  She required high flow oxygen.  Follow-up CTA imaging of the chest revealed bilateral groundglass opacities, septal thickening and no PE.  Course notable for worsening on 11/27 with hypotension and worsening of left lower lobe infiltrate.  Cultures were obtained and the patient was treated for suspected superimposed bacterial pneumonia.  On 11/30 she decompensated requiring intubation.   Past Medical History  [place] Hospital Events   11/09 Admit 11/14 Transfer to ICU  11/17 Transfer to PCU 11/27 Worsening of hypoxemic respiratory failure 11/29 Transfer back to ICU for worsening hypoxemia  11/30 Decompensation requiring intubation, paralytics 12/01 Prone positioning, acidosis overnight 12/02 New AF, later RVR in pm.  Prone held.  RBBB on EKG. Bedside ECHO w/o RV fx/septal bowing 12/04 Prone positioning 12/06 Prone held due to facial swelling  12/07 Prone positioning held due to significant facial swelling. Peak 34, plat 34 FiO2 100, peep 16 12/09 Resume prone positioning, P/F ratio 118 12/11 Emesis early am while prone, TF held. Weaning vasopressors 12/12 Off vasopressors most of day, back on with sedation  12/13 Off vasopressors  12/14 spiking fever.  Sputum sent, started  on broad-spectrum antibiotics including aztreonam, Cipro, and vancomycin.  Developed new onset atrial fibrillation placed on amiodarone infusion, changed Vt to 21ml/kg Consults:    Procedures:  ETT 11/30 >> L IJ TLC 11/30 >>  R Rad Aline 12/1 >>  RUE PICC 12/12 >>  Significant Diagnostic Tests:   CTA Chest 11/9 >> negative for PE, interlobular septal thickening, bilateral groundglass opacities with lower lobe predominance  CTA Chest 11/14 >> negative for PE, moderate worsening of groundglass opacities and septal thickening since prior exam superimposed on changes of sarcoidosis  ECHO 11/28 >> LVEF 60 to 65%, LVH, grade 1 diastolic dysfunction, LA/RA normal size, trace MVR, mild TVR, no evidence of left ear, mildly elevated pulmonary artery systolic pressure  ECHO 99991111 >> LVEF 55-60%, normal LV function, no LVH, global RV normal systolic function, RV moderately enlarged, LA normal, RA mildly dilated, trace MVR, mild TVR, PVR trivial, mildly elevated PAS pressure  Micro Data:  SARS CoV2 11/06 >> positive MRSA PCR 11/14 >> negative  BCx2 11/28 >> negative Sputum 12/14>>>  Antimicrobials:  Remdesivir 11/9 >> 11/14  Convalescent plasma 11/11  Toclizumab 11/11 & 11/13  Aztreonam 11/28 >> 12/4 azactam 12/14>>> vanc 12/14>>> cipro 12/14>>>  Interim history/subjective:  Now in prone position. PIP and Plats in high 30-40s  Objective   Blood pressure 112/61, pulse 80, temperature 99 F (37.2 C), temperature source Oral, resp. rate 10, height 5\' 5"  (1.651 m), weight 80.6 kg, SpO2 92 %. CVP:  [11 mmHg-59 mmHg] 59 mmHg  Vent Mode: PRVC FiO2 (%):  [100 %] 100 % Set Rate:  [30 bmp] 30 bmp Vt Set:  [400 mL] 400 mL PEEP:  [14 cmH20] 14 cmH20 Plateau Pressure:  [33 cmH20-41 cmH20]  39 cmH20   Intake/Output Summary (Last 24 hours) at 01/14/2019 0750 Last data filed at 01/14/2019 0600 Gross per 24 hour  Intake 3956.9 ml  Output 1400 ml  Net 2556.9 ml   Filed Weights   01/11/19 0500  01/12/19 0500 01/13/19 0458  Weight: 73 kg 79.8 kg 80.6 kg    Examination:  General acutely ill sedated on full vent support HENT some facial ecchymosis and periorbital edema Pulm equal chest rise scattered rhonchi Pplat 42 before reduction to 6 ml Card RRR abd soft tol tubefeeds Ext some edema brisk CR Pulses are palp Neuro sedated  Resolved Hospital Problem list   Elevated troponin from demand ischemia (preserved ER on Echo), Septic shock from HCAP, Elevated LFTs from shock Hypernatremia  Assessment & Plan:   Acute hypoxic, hypercapnic respiratory failure with ARDS: felt probable HCAP superimposed on recent COVID PNA and underlying Sarcoid Plan Continuing ARDS protocol with tidal volume goal 60 mL/kg predicted body weigh->changing to 12ml/kg  Plateau pressure goal less than 30  Driving pressure goal less than 15  PF ratio goal greater than 150, if not obtained we will continue prone therapy  PaO2 goal 55-65  Antibiotics as mentioned below    Resume diuresis  PAF  RBBB Hypotension Plan Continuing aspirin  Amiodarone infusion  Will hold off on systemic AC as had sig conjunctival hemorrhage    Acute metabolic encephalopathy Hx of anxiety Plan Cont PAD protocol Currently RASS goal -4 Currently on Dilaudid and Versed infusion  Leukocytosis  Fever curve improved since starting antibiotics, preliminarily sputum growing GPC Plan Day #2 vancomycin aztreonam and Cipro, I suspect will be able to narrow this in the next 24 hours  Emesis  Episode 12/9, 12/11, KUB negative Plan Continue tube feeds as tolerated  Hyperkalemia Plan Follow-up chemistry this a.m.   Macrocytic Anemia  Thrombocytopenia Plan Continue thiamine and folate Trend CBC Transfuse for hemoglobin less than 7   Hyperglycemia Hx of hypothyroidism Plan Continuing sliding scale insulin and Synthroid  Best practice:  Diet: TF DVT prophylaxis: fondaparinux GI prophylaxis: pepcid  Mobility:  BR  Code Status: Full Code  Family Communication: Husband Shanon Brow [phone]) called 12/13 for update.  Burns [place] (daughter [phone]).  Wetzel Bjornstad (daughter [phone]).  Husband works as a Engineer, drilling and at times has limited cell signal.  Please call Wells Guiles first if he can not be reached.  Disposition: ICU    My cct 35 min Erick Colace ACNP-BC [place] Pager # [phone] OR # [phone] if no answer

## 2019-01-14 NOTE — Progress Notes (Signed)
Post supine ABG results called to Dr. Lake Bells.  Ph- 7.27 PCO2- not reportable PaO2- 53 All other values not reportable.Order received to obtain ABG at 2100 and again in AM. RT will continue to monitor.

## 2019-01-14 NOTE — Progress Notes (Signed)
Updated Dr Lake Bells of pt condition

## 2019-01-14 NOTE — Progress Notes (Signed)
Head and arms repositioned with RT x2 and RN x1. During repositioning pt desaturated to 80%. Pt slow to recover but SpO2 increased to 92%. RT will continue to monitor. MD aware.

## 2019-01-14 NOTE — Progress Notes (Signed)
[facility]  Notified of ABG result: pH 7.2, pCO2 > 97, paO2 53 mmHg  TVol and RR changed earlier today to limit plateau pressure, but plateau still remains in   Would prefer to not increase minute ventilation as this will likely increase her plateau more  Will add nimbex continuous infusion to maintain synchrony as she has severe hypoxemia when she is dyssynchronous.    Will repeat ABG later this evening to ensure she is not too dyssynchronous  Roselie Awkward, MD [place] [facility] Pager: [phone] Cell: [phone] If no response, call [phone]

## 2019-01-14 NOTE — Progress Notes (Signed)
Rolin Barry NP about pt condition, provider to bedside will continue to monitor

## 2019-01-14 NOTE — Progress Notes (Signed)
Pt returned to supine position with RT x2 and RN x3. Post flip pt desaturated to 67% on 100% fio2 and peep of 14. Dr. Lake Bells at bedside. Pt suctioned via ETT and orally for copious amounts of tan/pink tinged secretions. SpO2 slow to rise but improving. Cloth tape removed and Holister tube holder placed. ETT remains secure at 25cm at the lip. Swelling to the lips and left eye noted. MD aware. Verbal order received to not re-prone tonight if needed. RT will continue to monitor.

## 2019-01-14 NOTE — Progress Notes (Signed)
Nutrition Follow-up   DOCUMENTATION CODES:   Not applicable  INTERVENTION:    Vital 1.5 at 40 ml/h (960 ml per day) via Cortrak tube   Pro-stat 60 ml TID   Provides 2040 kcal, 154 gm protein, 733 ml free water daily  Total free water: 1033 ml   NUTRITION DIAGNOSIS:   Increased nutrient needs related to acute illness(COVID 19 PNA) as evidenced by estimated needs.  Ongoing   GOAL:   Patient will meet greater than or equal to 90% of their needs  Progressing  MONITOR:   Vent status, Labs, TF tolerance, I & O's  REASON FOR ASSESSMENT:   Ventilator, Consult Enteral/tube feeding initiation and management  ASSESSMENT:   63 yo female admitted with fever, chills, COVID 19 PNA (tested positive on 11/6). PMH includes sarcoidosis, cervical cancer, hypothyroidism, HLD.  Per MD pt with worsening oxygenation and bacterial PNA.  Patient with severe ARDS related to COVID PNA; she decompensated and required intubation on 11/30.  12/6 prone stopped due to significant facial swelling  Patient is currently intubated on ventilator support MV: 11 L/min Temp (24hrs), Avg:98.3 F (36.8 C), Min:97.9 F (36.6 C), Max:99 F (37.2 C)   Labs reviewed.  CBG's: [phone]  Medications reviewed and include vitamin D3, colace, folic acid, lasix,  MVI, miralax, thiamine, vitamin B 12 Levo @ 18  100 ml free water every 8 hours = 300 ml   Admission weight 62.9 kg Current weight 80.6 kg Mild-moderate edema present with facial swelling  Diet Order:   Diet Order    None      EDUCATION NEEDS:   Not appropriate for education at this time  Skin:  Skin Assessment: Reviewed RN Assessment  Last BM:  12/14  Height:   Ht Readings from Last 1 Encounters:  01/01/19 5\' 5"  (1.651 m)    Weight:   Wt Readings from Last 1 Encounters:  01/13/19 80.6 kg    Ideal Body Weight:  56.8 kg  BMI:  Body mass index is 29.57 kg/m.  Estimated Nutritional Needs:   Kcal:   1900-2200  Protein:  100-115 gm  Fluid:  >/= 1.8 L  Maylon Peppers RD, LDN, CNSC [phone] Pager [phone] After Hours Pager

## 2019-01-15 LAB — POCT I-STAT 7, (LYTES, BLD GAS, ICA,H+H)
Acid-Base Excess: 19 mmol/L — ABNORMAL HIGH (ref 0.0–2.0)
Bicarbonate: 47.8 mmol/L — ABNORMAL HIGH (ref 20.0–28.0)
Calcium, Ion: 1.25 mmol/L (ref 1.15–1.40)
HCT: 30 % — ABNORMAL LOW (ref 36.0–46.0)
Hemoglobin: 10.2 g/dL — ABNORMAL LOW (ref 12.0–15.0)
O2 Saturation: 72 %
Patient temperature: 99.2
Potassium: 3.4 mmol/L — ABNORMAL LOW (ref 3.5–5.1)
Sodium: 137 mmol/L (ref 135–145)
TCO2: >50 mmol/L — ABNORMAL HIGH (ref 22–32)
pCO2 arterial: 81.7 mmHg (ref 32.0–48.0)
pH, Arterial: 7.376 (ref 7.350–7.450)
pO2, Arterial: 43 mmHg — ABNORMAL LOW (ref 83.0–108.0)

## 2019-01-15 LAB — GLUCOSE, CAPILLARY
Glucose-Capillary: 120 mg/dL — ABNORMAL HIGH (ref 70–99)
Glucose-Capillary: 139 mg/dL — ABNORMAL HIGH (ref 70–99)
Glucose-Capillary: 148 mg/dL — ABNORMAL HIGH (ref 70–99)
Glucose-Capillary: 150 mg/dL — ABNORMAL HIGH (ref 70–99)
Glucose-Capillary: 156 mg/dL — ABNORMAL HIGH (ref 70–99)
Glucose-Capillary: 167 mg/dL — ABNORMAL HIGH (ref 70–99)
Glucose-Capillary: 224 mg/dL — ABNORMAL HIGH (ref 70–99)

## 2019-01-15 LAB — BASIC METABOLIC PANEL
Anion gap: 8 (ref 5–15)
BUN: 25 mg/dL — ABNORMAL HIGH (ref 8–23)
CO2: 34 mmol/L — ABNORMAL HIGH (ref 22–32)
Calcium: 7 mg/dL — ABNORMAL LOW (ref 8.9–10.3)
Chloride: 99 mmol/L (ref 98–111)
Creatinine, Ser: 0.44 mg/dL (ref 0.44–1.00)
GFR calc Af Amer: >60 mL/min (ref >60)
GFR calc non Af Amer: >60 mL/min (ref >60)
Glucose, Bld: 176 mg/dL — ABNORMAL HIGH (ref 70–99)
Potassium: 2.8 mmol/L — ABNORMAL LOW (ref 3.5–5.1)
Sodium: 141 mmol/L (ref 135–145)

## 2019-01-15 LAB — CBC
HCT: 27.4 % — ABNORMAL LOW (ref 36.0–46.0)
Hemoglobin: 8 g/dL — ABNORMAL LOW (ref 12.0–15.0)
MCH: 31.6 pg (ref 26.0–34.0)
MCHC: 29.2 g/dL — ABNORMAL LOW (ref 30.0–36.0)
MCV: 108.3 fL — ABNORMAL HIGH (ref 80.0–100.0)
Platelets: 323 10*3/uL (ref 150–400)
RBC: 2.53 MIL/uL — ABNORMAL LOW (ref 3.87–5.11)
RDW: 17.7 % — ABNORMAL HIGH (ref 11.5–15.5)
WBC: 27.5 10*3/uL — ABNORMAL HIGH (ref 4.0–10.5)
nRBC: 0.1 % (ref 0.0–0.2)

## 2019-01-15 LAB — MAGNESIUM: Magnesium: 1.3 mg/dL — ABNORMAL LOW (ref 1.7–2.4)

## 2019-01-15 MED ORDER — POTASSIUM CHLORIDE 20 MEQ/15ML (10%) PO SOLN
40.0000 meq | Freq: Three times a day (TID) | ORAL | Status: AC
  Administered 2019-01-15 (×2): 40 meq
  Filled 2019-01-15 (×2): qty 30

## 2019-01-15 MED ORDER — MAGNESIUM SULFATE 2 GM/50ML IV SOLN
2.0000 g | Freq: Once | INTRAVENOUS | Status: AC
  Administered 2019-01-15: 2 g via INTRAVENOUS
  Filled 2019-01-15: qty 50

## 2019-01-15 MED ORDER — PHENYLEPHRINE CONCENTRATED 100MG/250ML (0.4 MG/ML) INFUSION SIMPLE
0.0000 ug/min | INTRAVENOUS | Status: DC
  Administered 2019-01-15 – 2019-01-17 (×4): 80 ug/min via INTRAVENOUS
  Filled 2019-01-15 (×3): qty 250

## 2019-01-15 MED ORDER — PHENYLEPHRINE HCL-NACL 10-0.9 MG/250ML-% IV SOLN
0.0000 ug/min | INTRAVENOUS | Status: DC
  Administered 2019-01-15: 50 ug/min via INTRAVENOUS
  Administered 2019-01-15: 20 ug/min via INTRAVENOUS
  Administered 2019-01-15: 80 ug/min via INTRAVENOUS
  Filled 2019-01-15 (×3): qty 250

## 2019-01-15 NOTE — Progress Notes (Signed)
proned at 1600, tolerated well

## 2019-01-15 NOTE — Progress Notes (Signed)
Facilitated webcam visit via [employer] Link for husband Shanon Brow & family.  Updated husband Shanon Brow on pt's condition & answered all questions.

## 2019-01-15 NOTE — Progress Notes (Signed)
Pt proned at 1550.

## 2019-01-15 NOTE — Progress Notes (Signed)
Pt ETT secured with cloth tape at 26cm at the lip. Appropriate protective barriers placed. Pt with large sore on upper left lip. RN aware. Pt proned with RT x2 and RN x4. Pt tolerated well, RT will continue to monitor.

## 2019-01-15 NOTE — Progress Notes (Signed)
0400 ABG result- 7.29/ >97/ 62/  Unreadable.  Results may not cross over to epic d/t brackets on istat.  Adjustments to vent made.  Increase VT to 438ml (from 340) and RR to 35 (from 34).  Will follow up with ABG.

## 2019-01-15 NOTE — Progress Notes (Signed)
Increases made d/t ABG result.

## 2019-01-15 NOTE — Progress Notes (Addendum)
NAME:  Margaurite Stuller, MRN:  [id number], DOB:  [birth year], LOS: 54 ADMISSION DATE:  12/21/2018, CONSULTATION DATE: 11/30 REFERRING MD: Dr. Sherral Hammers, CHIEF COMPLAINT: Shortness of breath  Brief History   63 year old female admitted 11/9 with a 10-day history of shortness of breath.  Outpatient testing for Covid was initially negative, then positive on 10/30. As an outpatient she completed 2 rounds of antibiotics and steroids. She presented to [facility] on 11/9 after a syncopal episode and was found to be COVID-19 positive.  After transfer to [facility] the patient was treated with steroids, remdesivir, convalescent plasma and 2 doses of tocilizumab.  She required high flow oxygen.  Follow-up CTA imaging of the chest revealed bilateral groundglass opacities, septal thickening and no PE.  Course notable for worsening on 11/27 with hypotension and worsening of left lower lobe infiltrate.  Cultures were obtained and the patient was treated for suspected superimposed bacterial pneumonia.  On 11/30 she decompensated requiring intubation.   Past Medical History  [facility] Events   11/09 Admit 11/14 Transfer to ICU  11/17 Transfer to PCU 11/27 Worsening of hypoxemic respiratory failure 11/29 Transfer back to ICU for worsening hypoxemia  11/30 Decompensation requiring intubation, paralytics 12/01 Prone positioning, acidosis overnight 12/02 New AF, later RVR in pm.  Prone held.  RBBB on EKG. Bedside ECHO w/o RV fx/septal bowing 12/04 Prone positioning 12/06 Prone held due to facial swelling  12/07 Prone positioning held due to significant facial swelling. Peak 34, plat 34 FiO2 100, peep 16 12/09 Resume prone positioning, P/F ratio 118 12/11 Emesis early am while prone, TF held. Weaning vasopressors 12/12 Off vasopressors most of day, back on with sedation  12/13 Off vasopressors  12/14 spiking fever.  Sputum sent, started  on broad-spectrum antibiotics including aztreonam, Cipro, and vancomycin.  Developed new onset atrial fibrillation placed on amiodarone infusion, changed Vt to 70ml/kg 12/16 high pressures on vent. Plateau as high as 47. Improved somewhat with decreased Vt  Consults:    Procedures:  ETT 11/30 >> L IJ TLC 11/30 >>  R Rad Aline 12/1 >>  RUE PICC 12/12 >>  Significant Diagnostic Tests:   CTA Chest 11/9 >> negative for PE, interlobular septal thickening, bilateral groundglass opacities with lower lobe predominance  CTA Chest 11/14 >> negative for PE, moderate worsening of groundglass opacities and septal thickening since prior exam superimposed on changes of sarcoidosis  ECHO 11/28 >> LVEF 60 to 65%, LVH, grade 1 diastolic dysfunction, LA/RA normal size, trace MVR, mild TVR, no evidence of left ear, mildly elevated pulmonary artery systolic pressure  ECHO 99991111 >> LVEF 55-60%, normal LV function, no LVH, global RV normal systolic function, RV moderately enlarged, LA normal, RA mildly dilated, trace MVR, mild TVR, PVR trivial, mildly elevated PAS pressure  Micro Data:  SARS CoV2 11/06 >> positive MRSA PCR 11/14 >> negative  BCx2 11/28 >> negative Sputum 12/14 >> MRSA  Antimicrobials:  Remdesivir 11/9 >> 11/14  Convalescent plasma 11/11  Toclizumab 11/11 & 11/13  Aztreonam 11/28 >> 12/4 azactam 12/14 > 12/16 cipro 12/14 > 12/16  vanc 12/14 >   Interim history/subjective:  AF RVR still despite amio Levophed at 68mcg CXR worse: personally reviewed I/O 2L negative for admission PRVC 400, f35, Vt 400, 100%, 18 PEEP Plat 47, Driving pressure 23  Objective   Blood pressure 102/68, pulse 100, temperature 99.2 F (37.3 C), temperature source Axillary, resp. rate (!) 35, height 5\' 5"  (1.651 m), weight 86 kg, SpO2 Marland Kitchen)  88 %.    Vent Mode: PRVC FiO2 (%):  [100 %] 100 % Set Rate:  [30 bmp-35 bmp] 35 bmp Vt Set:  [340 mL-400 mL] 400 mL PEEP:  [14 cmH20-18 cmH20] 18 cmH20 Plateau  Pressure:  [30 cmH20-37 cmH20] 36 cmH20   Intake/Output Summary (Last 24 hours) at 01/15/2019 0800 Last data filed at 01/15/2019 0600 Gross per 24 hour  Intake 4299.25 ml  Output 2690 ml  Net 1609.25 ml   Filed Weights   01/12/19 0500 01/13/19 0458 01/15/19 0500  Weight: 79.8 kg 80.6 kg 86 kg    Examination:  General:  Overweight female on vent Neuro:  Sedated, NMB. BIS 40 HEENT:  ETT, NGT, Bruising to bilateral cheeks r/t prone. Left Eye with conjunctival hemorrhage.  Cardiovascular:  IRIR, intermittently tachycardic rates 70-150s, no MRG Lungs:  Diffuse rhonchi Abdomen:  Soft, non-distended Musculoskeletal:  No acute deformity Skin:  Intact, MMM   Resolved Hospital Problem list   Elevated troponin from demand ischemia (preserved ER on Echo), Septic shock from HCAP, Elevated LFTs from shock Hypernatremia  Assessment & Plan:   Acute hypoxic, hypercapnic respiratory failure with ARDS: MRSA HCAP superimposed on recent COVID PNA and underlying Sarcoid Plan Continuing ARDS protocol with tidal volume goal 60 mL/kg predicted body weigh->changing to 68ml/kg  Plateau pressure goal less than 30  Driving pressure goal less than 15  PF ratio goal greater than 150, if not obtained we will continue prone therapy  PaO2 goal 55-65  Narrow antibiotics to vancomycin Deep sedation and neuromuscular blockade infusion as below. Holding diuresis today. 2 L Negative total with low K and Mag. In shock on pressors.   Settings adjusted back to 7cc/kg Vt (340) and rate 34. OK to permit some degree of elevated PCO2 to achieve pressure goals.  Repeat ABG 1100   Shock septic vs sedation related Plan Change norepinephrine to phenylephrine with hopes of improved HR.   PAF  RBBB Plan Continuing aspirin  Amiodarone infusion  Will hold off on systemic AC as had significant conjunctival hemorrhage, but will likely need to consider starting in the next few days.    Acute metabolic  encephalopathy Hx of anxiety Plan Cont PAD protocol Currently RASS goal -4 Currently on Dilaudid and Versed infusion Nimbex infusion  Emesis  Episode 12/9, 12/11, KUB negative Plan Continue tube feeds as tolerated  Hypokalemia Hypomagnesemia  Plan Follow-up chemistry this a.m. Replacing K, mag  Macrocytic Anemia  Thrombocytopenia Plan Continue thiamine and folate Trend CBC Transfuse for hemoglobin less than 7  Hyperglycemia Hx of hypothyroidism Plan Continuing sliding scale insulin and Synthroid   Best practice:  Diet: TF DVT prophylaxis: fondaparinux GI prophylaxis: pepcid  Mobility: BR  Code Status: Full Code  Family Communication: Husband Shanon Brow [phone]) . Updated 12/16.   Burns Spain (daughter [phone]).  Wetzel Bjornstad (daughter [phone]).  Husband works as a Engineer, drilling and at times has limited cell signal.  Please call Wells Guiles first if he can not be reached.  Disposition: ICU   Critical care time 45 minutes required personally due to ARDS, COVID-19 pneumonia, MRSA pneumonia, atrial fibrillation with RVR.   Georgann Housekeeper, AGACNP-BC [place]  See Amion for personal pager PCCM on call pager [phone]  01/15/2019 10:59 AM

## 2019-01-16 ENCOUNTER — Inpatient Hospital Stay (HOSPITAL_COMMUNITY): Payer: BC Managed Care – PPO

## 2019-01-16 LAB — POCT I-STAT 7, (LYTES, BLD GAS, ICA,H+H)
Calcium, Ion: 1.35 mmol/L (ref 1.15–1.40)
Calcium, Ion: 1.28 mmol/L (ref 1.15–1.40)
Calcium, Ion: 1.29 mmol/L (ref 1.15–1.40)
Calcium, Ion: 1.39 mmol/L (ref 1.15–1.40)
HCT: 30 % — ABNORMAL LOW (ref 36.0–46.0)
HCT: 31 % — ABNORMAL LOW (ref 36.0–46.0)
HCT: 30 % — ABNORMAL LOW (ref 36.0–46.0)
HCT: 30 % — ABNORMAL LOW (ref 36.0–46.0)
Hemoglobin: 10.2 g/dL — ABNORMAL LOW (ref 12.0–15.0)
Hemoglobin: 10.5 g/dL — ABNORMAL LOW (ref 12.0–15.0)
Hemoglobin: 10.2 g/dL — ABNORMAL LOW (ref 12.0–15.0)
Hemoglobin: 10.2 g/dL — ABNORMAL LOW (ref 12.0–15.0)
Patient temperature: 99
Patient temperature: 99.8
Patient temperature: 99
Potassium: 4.1 mmol/L (ref 3.5–5.1)
Potassium: 3.5 mmol/L (ref 3.5–5.1)
Potassium: 3.5 mmol/L (ref 3.5–5.1)
Potassium: 4.9 mmol/L (ref 3.5–5.1)
Sodium: 138 mmol/L (ref 135–145)
Sodium: 137 mmol/L (ref 135–145)
Sodium: 136 mmol/L (ref 135–145)
Sodium: 137 mmol/L (ref 135–145)
pCO2 arterial: >97 mmHg (ref 32.0–48.0)
pCO2 arterial: >97 mmHg (ref 32.0–48.0)
pCO2 arterial: >97 mmHg (ref 32.0–48.0)
pCO2 arterial: >97 mmHg (ref 32.0–48.0)
pH, Arterial: 7.274 — ABNORMAL LOW (ref 7.350–7.450)
pH, Arterial: 7.295 — ABNORMAL LOW (ref 7.350–7.450)
pH, Arterial: 7.263 — ABNORMAL LOW (ref 7.350–7.450)
pH, Arterial: 7.225 — ABNORMAL LOW (ref 7.350–7.450)
pO2, Arterial: 53 mmHg — ABNORMAL LOW (ref 83.0–108.0)
pO2, Arterial: 62 mmHg — ABNORMAL LOW (ref 83.0–108.0)
pO2, Arterial: 66 mmHg — ABNORMAL LOW (ref 83.0–108.0)
pO2, Arterial: 47 mmHg — ABNORMAL LOW (ref 83.0–108.0)

## 2019-01-16 LAB — BASIC METABOLIC PANEL
Anion gap: 8 (ref 5–15)
BUN: 31 mg/dL — ABNORMAL HIGH (ref 8–23)
CO2: 43 mmol/L — ABNORMAL HIGH (ref 22–32)
Calcium: 9.3 mg/dL (ref 8.9–10.3)
Chloride: 91 mmol/L — ABNORMAL LOW (ref 98–111)
Creatinine, Ser: 0.58 mg/dL (ref 0.44–1.00)
GFR calc Af Amer: >60 mL/min (ref >60)
GFR calc non Af Amer: >60 mL/min (ref >60)
Glucose, Bld: 182 mg/dL — ABNORMAL HIGH (ref 70–99)
Potassium: 4.8 mmol/L (ref 3.5–5.1)
Sodium: 142 mmol/L (ref 135–145)

## 2019-01-16 LAB — PHOSPHORUS: Phosphorus: 5.3 mg/dL — ABNORMAL HIGH (ref 2.5–4.6)

## 2019-01-16 LAB — CBC
HCT: 33.2 % — ABNORMAL LOW (ref 36.0–46.0)
Hemoglobin: 9.4 g/dL — ABNORMAL LOW (ref 12.0–15.0)
MCH: 31.3 pg (ref 26.0–34.0)
MCHC: 28.3 g/dL — ABNORMAL LOW (ref 30.0–36.0)
MCV: 110.7 fL — ABNORMAL HIGH (ref 80.0–100.0)
Platelets: 352 10*3/uL (ref 150–400)
RBC: 3 MIL/uL — ABNORMAL LOW (ref 3.87–5.11)
RDW: 18.5 % — ABNORMAL HIGH (ref 11.5–15.5)
WBC: 27.1 10*3/uL — ABNORMAL HIGH (ref 4.0–10.5)
nRBC: 0.3 % — ABNORMAL HIGH (ref 0.0–0.2)

## 2019-01-16 LAB — CULTURE, RESPIRATORY W GRAM STAIN

## 2019-01-16 LAB — GLUCOSE, CAPILLARY
Glucose-Capillary: 174 mg/dL — ABNORMAL HIGH (ref 70–99)
Glucose-Capillary: 175 mg/dL — ABNORMAL HIGH (ref 70–99)
Glucose-Capillary: 188 mg/dL — ABNORMAL HIGH (ref 70–99)
Glucose-Capillary: 193 mg/dL — ABNORMAL HIGH (ref 70–99)
Glucose-Capillary: 204 mg/dL — ABNORMAL HIGH (ref 70–99)

## 2019-01-16 LAB — MAGNESIUM: Magnesium: 2.4 mg/dL (ref 1.7–2.4)

## 2019-01-16 MED ORDER — INSULIN DETEMIR 100 UNIT/ML ~~LOC~~ SOLN
6.0000 [IU] | Freq: Every day | SUBCUTANEOUS | Status: DC
  Administered 2019-01-16: 6 [IU] via SUBCUTANEOUS
  Filled 2019-01-16 (×2): qty 0.06

## 2019-01-16 NOTE — Progress Notes (Signed)
Pt unproned at this time with no complications. Pt stable throughout, VS within normal limits. Pt has significant swelling to face. ETT tube holder placed and et secure in proper position.

## 2019-01-16 NOTE — Progress Notes (Signed)
NAME:  Kim Harrison, MRN:  [id number], DOB:  06-Mar-1955, LOS: 20 ADMISSION DATE:  12/21/2018, CONSULTATION DATE: 11/30 REFERRING MD: Dr. Sherral Hammers, CHIEF COMPLAINT: Shortness of breath  Brief History   63 year old female admitted 11/9 with a 10-day history of shortness of breath.  Outpatient testing for Covid was initially negative, then positive on 10/30. As an outpatient she completed 2 rounds of antibiotics and steroids. She presented to [facility] on 11/9 after a syncopal episode and was found to be COVID-19 positive.  After transfer to [facility] the patient was treated with steroids, remdesivir, convalescent plasma and 2 doses of tocilizumab.  She required high flow oxygen.  Follow-up CTA imaging of the chest revealed bilateral groundglass opacities, septal thickening and no PE.  Course notable for worsening on 11/27 with hypotension and worsening of left lower lobe infiltrate.  Cultures were obtained and the patient was treated for suspected superimposed bacterial pneumonia.  On 11/30 she decompensated requiring intubation.   Past Medical History  [facility] Events   11/09 Admit 11/14 Transfer to ICU  11/17 Transfer to PCU 11/27 Worsening of hypoxemic respiratory failure 11/29 Transfer back to ICU for worsening hypoxemia  11/30 Decompensation requiring intubation, paralytics 12/01 Prone positioning, acidosis overnight 12/02 New AF, later RVR in pm.  Prone held.  RBBB on EKG. Bedside ECHO w/o RV fx/septal bowing 12/04 Prone positioning 12/06 Prone held due to facial swelling  12/07 Prone positioning held due to significant facial swelling. Peak 34, plat 34 FiO2 100, peep 16 12/09 Resume prone positioning, P/F ratio 118 12/11 Emesis early am while prone, TF held. Weaning vasopressors 12/12 Off vasopressors most of day, back on with sedation  12/13 Off vasopressors  12/14 spiking fever.  Sputum sent, started  on broad-spectrum antibiotics including aztreonam, Cipro, and vancomycin.  Developed new onset atrial fibrillation placed on amiodarone infusion, changed Vt to 44ml/kg.  Nimbex drip started 12/16 high pressures on vent. Plateau as high as 47. Improved somewhat with decreased Vt 12/17: Off from norepinephrine, on intermittent dose phenylephrine.  Pulmonary can ask worse, plateau pressures exceeding 40  Consults:    Procedures:  ETT 11/30 >> L IJ TLC 11/30 >>  R Rad Aline 12/1 >>  RUE PICC 12/12 >>  Significant Diagnostic Tests:   CTA Chest 11/9 >> negative for PE, interlobular septal thickening, bilateral groundglass opacities with lower lobe predominance  CTA Chest 11/14 >> negative for PE, moderate worsening of groundglass opacities and septal thickening since prior exam superimposed on changes of sarcoidosis  ECHO 11/28 >> LVEF 60 to 65%, LVH, grade 1 diastolic dysfunction, LA/RA normal size, trace MVR, mild TVR, no evidence of left ear, mildly elevated pulmonary artery systolic pressure  ECHO 99991111 >> LVEF 55-60%, normal LV function, no LVH, global RV normal systolic function, RV moderately enlarged, LA normal, RA mildly dilated, trace MVR, mild TVR, PVR trivial, mildly elevated PAS pressure  Micro Data:  SARS CoV2 11/06 >> positive MRSA PCR 11/14 >> negative  BCx2 11/28 >> negative Sputum 12/14 >> MRSA  Antimicrobials:  Remdesivir 11/9 >> 11/14  Convalescent plasma 11/11  Toclizumab 11/11 & 11/13  Aztreonam 11/28 >> 12/4 azactam 12/14 > 12/16 cipro 12/14 > 12/16  vanc 12/14 >   Interim history/subjective:   Remains on vasoactive drips at current rate of: Still on amiodarone infusion Placed in supine position at 0840, saturations down in 80s  Objective   Blood pressure 98/61, pulse 90, temperature 99.3 F (37.4 C), temperature source  Oral, resp. rate (Abnormal) 34, height 5\' 5"  (1.651 m), weight 85.8 kg, SpO2 93 %.    Vent Mode: PRVC FiO2 (%):  [100 %] 100 % Set  Rate:  [34 bmp] 34 bmp Vt Set:  [340 mL] 340 mL PEEP:  [18 cmH20] 18 cmH20 Plateau Pressure:  [38 cmH20-40 cmH20] 40 cmH20   Intake/Output Summary (Last 24 hours) at 01/16/2019 [id number] Last data filed at 01/16/2019 0800 Gross per 24 hour  Intake 5146.61 ml  Output 1305 ml  Net 3841.61 ml   Filed Weights   01/13/19 0458 01/15/19 0500 01/16/19 0500  Weight: 80.6 kg 86 kg 85.8 kg    Examination:  General: This is a critically ill 63 year old white female she remains on full ventilatory support pulse oximetry and gas exchange have declined following placement back in the prone position early this morning HEENT normocephalic atraumatic orally intubated no JVD Pulmonary scattered rhonchi equal chest rise Ventilator assessment: Remains on 6 mL/kg tidal volume which is 340 mL, PEEP now at 18, FiO2 still at 100% with saturations 93 P plat: 43 Driving [id number]  ABG on current settings in supine position    Component Value Date/Time   PHART 7.225 (L) 01/16/2019 1017   PCO2ART >97.0 (HH) 01/16/2019 1017   PO2ART 47 (L) 01/16/2019 1017   HCO3 47.8 (H) 01/15/2019 0631   TCO2 >50 (H) 01/15/2019 0631   O2SAT 72.0 01/15/2019 0631   Cardiac currently sinus rhythm with controlled ventricular rate on amiodarone infusion Abdomen soft not tender no organomegaly positive bowel sounds tolerating tube feeds currently GU clear yellow urine Via Foley catheter Neuro: Sedated heavily on ventilator  Resolved Hospital Problem list   Elevated troponin from demand ischemia (preserved ER on Echo), Septic shock from HCAP, Elevated LFTs from shock Hypernatremia  Emesis Assessment & Plan:   Acute hypoxic, hypercapnic respiratory failure with ARDS: MRSA HCAP superimposed on recent COVID PNA and underlying Sarcoid Portable chest x-ray personally reviewed no significant change accounting for positional differences She is on 6 cc/kg, PEEP and FiO2 have been maximized, she has severely restricted lung  physiology at this point with plateau pressures exceeding 40.  The only interventions we have left at this point are diuresis in prone positioning Plan Continuing full ventilatory support with ARDS protocol at 6 mL/kg predicted body weight Continue to titrate PEEP and FiO2 via protocol for goal PaO2 goal of 55-65 PaO2/FiO2 ratio goal greater than 150, will continue prone protocol Adding back diuresis We will continue heavy sedation and neuromuscular blockade Continuing vancomycin now day #3  Shock septic vs sedation related Phenylephrine remains at 80 mcg Norepinephrine discontinued Plan Titrate phenylephrine for mean arterial pressure greater than 65 Try to get norepinephrine off, this may be challenging Antibiotics as above  PAF  RBBB Plan Continue aspirin  Continue amiodarone infusion  Continue Arixtra at current dosing   Acute metabolic encephalopathy Hx of anxiety Plan Continue current PAD protocol with RASS goal of -5 Dilaudid and Versed infusion currently on Nimbex infusion  Intermittent fluid and electrolyte imbalance:  Magnesium and potassium both replaced on 12/16 normal today  plan Continue to trend electrolytes daily, empirically replacing with diuresis, holding diuretics today  Macrocytic Anemia  Thrombocytopenia Plan Continuing thiamine and folate  Trending CBC  Transfusion trigger for hemoglobin less than 7    Hyperglycemia Hx of hypothyroidism Plan Continue sliding scale insulin  Will add Levemir will start at 6 units daily with current sliding scale  Continue Synthroid     Best  practice:  Diet: TF DVT prophylaxis: fondaparinux GI prophylaxis: pepcid  Mobility: BR  Code Status: Full Code  Family Communication: Husband Shanon Brow [phone]) . Updated 12/16.   Burns Spain (daughter [phone]).  Wetzel Bjornstad (daughter [phone]).  Husband works as a Engineer, drilling and at times has limited cell signal.  Please call Wells Guiles first if he can not  be reached.  Disposition: ICU   Disposition: Remains critically ill.  Ventilator compliance has become worse over the last 24 to 48 hours really little else to offer we will continue to attempt prone positioning, add back diuresis, and continue antibiotics.  I will speak to her husband later today in regards to goals of care.     Critical care x36 minutes  Erick Colace ACNP-BC [place] Pager # [phone] OR # [phone] if no answer]

## 2019-01-16 NOTE — Progress Notes (Signed)
Spoke with pts husband Collier Salina.  Updated on pt status.  Heart rhythm is becoming more abnormal and oxygen saturations are dropping.  Pt is comfortable on ventilator.  Will notify with any acute changes.

## 2019-01-16 NOTE — Progress Notes (Signed)
unproned successfully, tolerated well, sat in low 80s, vent at 100% FiO2, waiting for O2 to recover

## 2019-01-16 NOTE — Progress Notes (Signed)
Video call with family

## 2019-01-16 NOTE — Progress Notes (Signed)
   01/16/19 1200  Clinical Encounter Type  Visited With Family  Visit Type Follow-up;Psychological support;Spiritual support  Referral From Other (Comment);Family (Patient Relations )  Consult/Referral To Chaplain  Spiritual Encounters  Spiritual Needs Emotional;Other (Comment) (Spiritual Care Support)  Stress Factors  Family Stress Factors Not reviewed   I called to follow up with the family. Nobody answered the phone, so I left a voicemail with my contact information. I will follow up with this family at a later time.   Chaplain Shanon Ace M.Div., [facility]

## 2019-01-16 NOTE — Progress Notes (Signed)
Pt proned per MD order. Pt spo2 low at 49%, CCM NP to bedside. ETT secured with cloth tape in proper position.

## 2019-01-16 NOTE — Progress Notes (Signed)
I spoke at length to the patient's husband Kim Harrison.  He understands the events of the week, the fact that Kim Harrison has declined over the course of her hospitalization.  He understands that we have little else to offer her other than diuresis and ongoing support.  At this point he is agreed to DO NOT RESUSCITATE status with no escalation.  He wanted some time to discuss with his family in regards to further goals of care  Erick Colace ACNP-BC [place] Pager # [phone] OR # [phone] if no answer

## 2019-01-16 NOTE — Progress Notes (Signed)
Jerrye Noble NP made aware that sats have dropped from 70s to 41s rapidly. NP came tp bedside, pt proned, sated in 32s. NP and MD have spoke with family pt is a DNR. Will continue to monitor closely

## 2019-01-16 NOTE — Progress Notes (Signed)
Spoke with Laurey Arrow, NP regarding pts plan of care.  Pt currently sating between 50s-70s.  Clarified that plan is to re-prone this afternoon around 1630.  Pt is DNR with no escalation of care at this point.

## 2019-01-17 LAB — CBC
HCT: 31.5 % — ABNORMAL LOW (ref 36.0–46.0)
Hemoglobin: 8.8 g/dL — ABNORMAL LOW (ref 12.0–15.0)
MCH: 31.1 pg (ref 26.0–34.0)
MCHC: 27.9 g/dL — ABNORMAL LOW (ref 30.0–36.0)
MCV: 111.3 fL — ABNORMAL HIGH (ref 80.0–100.0)
Platelets: 331 10*3/uL (ref 150–400)
RBC: 2.83 MIL/uL — ABNORMAL LOW (ref 3.87–5.11)
RDW: 18.9 % — ABNORMAL HIGH (ref 11.5–15.5)
WBC: 21.8 10*3/uL — ABNORMAL HIGH (ref 4.0–10.5)
nRBC: 0.4 % — ABNORMAL HIGH (ref 0.0–0.2)

## 2019-01-17 LAB — BASIC METABOLIC PANEL
Anion gap: 8 (ref 5–15)
BUN: 46 mg/dL — ABNORMAL HIGH (ref 8–23)
CO2: 40 mmol/L — ABNORMAL HIGH (ref 22–32)
Calcium: 9.5 mg/dL (ref 8.9–10.3)
Chloride: 93 mmol/L — ABNORMAL LOW (ref 98–111)
Creatinine, Ser: 0.76 mg/dL (ref 0.44–1.00)
GFR calc Af Amer: >60 mL/min (ref >60)
GFR calc non Af Amer: >60 mL/min (ref >60)
Glucose, Bld: 163 mg/dL — ABNORMAL HIGH (ref 70–99)
Potassium: 5.4 mmol/L — ABNORMAL HIGH (ref 3.5–5.1)
Sodium: 141 mmol/L (ref 135–145)

## 2019-01-17 LAB — GLUCOSE, CAPILLARY
Glucose-Capillary: 146 mg/dL — ABNORMAL HIGH (ref 70–99)
Glucose-Capillary: 162 mg/dL — ABNORMAL HIGH (ref 70–99)
Glucose-Capillary: 261 mg/dL — ABNORMAL HIGH (ref 70–99)
Glucose-Capillary: 304 mg/dL — ABNORMAL HIGH (ref 70–99)

## 2019-01-17 LAB — VANCOMYCIN, PEAK: Vancomycin Pk: 48 ug/mL — ABNORMAL HIGH (ref 30–40)

## 2019-01-17 LAB — MAGNESIUM: Magnesium: 2.7 mg/dL — ABNORMAL HIGH (ref 1.7–2.4)

## 2019-01-31 NOTE — Death Summary Note (Signed)
DEATH SUMMARY   Patient Details  Name: Taffany Gerwin MRN: [id number] DOB: [birth year]  Admission/Discharge Information   Admit Date:  01/07/19  Date of Death: Date of Death: 2019-02-15  Time of Death: Time of Death: 05/22/29  Length of Stay: 05/22/36  Referring Physician: Lou Miner, MD   Reason(s) for Hospitalization  Dyspnea  Diagnoses  Preliminary cause of death:   COVID 17-May-2022 Secondary Diagnoses (including complications and co-morbidities):  Principal Problem:   Acute respiratory failure with hypoxia ([place]) Active Problems:   PANIC DISORDER   Pneumonia due to COVID-19 virus   Hypothyroidism   Sarcoidosis   HCAP (healthcare-associated pneumonia)   Hypotension   Hypotension, unspecified   Panic disorder   Anxiety   Prediabetes   Metabolic alkalosis   Acute respiratory disease due to COVID-19 virus   Vomiting   Brief Hospital Course (including significant findings, care, treatment, and services provided and events leading to death)  Norman Koler is a 64 y.o. year old female who was admitted to this facility on 2023-01-07 in the setting of dyspnea for several days.  She tested positive for coronavirus on 10/30.  She was transferred to [facility] where she was treated with steroids, remdesivir, convalescent plasma and tocilizumab.  She was treated with high flow oxygen initially.  She was treated on the progressive care unit with supportive care for several weeks.  On 11/30 her dyspnea and hypoxemia progressed to the point that she needed to be intubated due to worsening hypoxemia.  She was treated with antibiotics for bacterial pneumonia, required prone positioning for several weeks. Her condition continued to decline.  We discussed the situation with her family and they felt that given her lack of progress the best approach was to withdraw care.  She died after a family visit with her husband and daughters.   Pertinent Labs and Studies  Significant Diagnostic  Studies DG Chest 1 View  Result Date: 12/28/2018 CLINICAL DATA:  64 year old with current history of sarcoidosis who tested positive for COVID-19 earlier this month and had a CTA chest 2 weeks ago demonstrating worsening ground-glass opacities and septal thickening throughout both lungs, possibly related to COVID or pulmonary edema. Follow-up. EXAM: Portable CHEST 1 VIEW COMPARISON:  Chest x-ray 12/13/2018 and earlier. CTA chest 12/14/2018 and earlier. FINDINGS: The ground-glass opacities and septal thickening throughout both lungs is unchanged since the chest x-ray and CT 2 weeks ago. There are new confluent airspace opacities with air bronchograms in the MEDIAL LEFT LOWER LOBE. No new pulmonary parenchymal abnormalities elsewhere. IMPRESSION: 1. Stable ground-glass opacities throughout both lungs since the chest x-ray and CT 2 weeks ago. 2. New confluent pneumonia in the MEDIAL LEFT LOWER LOBE. Electronically Signed   By: Evangeline Dakin M.D.   On: 12/28/2018 13:30   DG Chest Port 1 View  Result Date: 01/16/2019 CLINICAL DATA:  Hypoxia EXAM: PORTABLE CHEST 1 VIEW COMPARISON:  January 14, 2019 FINDINGS: Endotracheal tube tip is 4.1 cm above the carina. Central catheter tip is in the superior vena cava near the cavoatrial junction. Feeding tube tip is below the diaphragm. No pneumothorax. Widespread airspace opacity bilaterally with underlying diffuse interstitial thickening remains stable. Small right pleural effusion. No appreciable new opacity. Heart size is upper normal with pulmonary vascularity normal. No adenopathy. No bone lesions. IMPRESSION: Tube and catheter positions as described without pneumothorax. Extensive widespread airspace opacity and interstitial thickening with small right pleural effusion. Appearance is essentially stable compared to 2 days prior. Electronically Signed  By: Lowella Grip III M.D.   On: 01/16/2019 08:20   DG CHEST PORT 1 VIEW  Result Date:  01/14/2019 CLINICAL DATA:  Acute respiratory failure.  COVID-19 pneumonia. EXAM: PORTABLE CHEST 1 VIEW COMPARISON:  Single-view of the chest 01/12/2019. FINDINGS: Endotracheal tube tip is 2 cm above the carina. A new right PICC is in place with the tip in the mid superior vena cava. Feeding tube tip is seen in the stomach. Diffuse bilateral airspace disease is somewhat worse than on the prior examination. There appears to be a new right pleural effusion. Heart size is normal. No pneumothorax. IMPRESSION: Tip of right PICC is in the mid superior vena cava. Feeding tube tip is in the stomach. Worsened diffuse bilateral airspace disease with a new small right pleural effusion. Electronically Signed   By: Inge Rise M.D.   On: 01/14/2019 14:38   DG Chest Port 1 View  Result Date: 01/12/2019 CLINICAL DATA:  Acute respiratory failure with hypoxia. EXAM: PORTABLE CHEST 1 VIEW COMPARISON:  January 11, 2019. FINDINGS: Stable position of endotracheal and feeding tubes. Right-sided PICC line has been removed. Left internal jugular catheter has been removed. Interval placement of new left-sided PICC line, but distal tip is abnormal position as it is left of the spine. Intra-arterial position cannot be excluded. No pneumothorax is noted. Stable diffuse lung opacities are noted consistent with pneumonia. Small pleural effusions cannot be excluded. Bony thorax is unremarkable. IMPRESSION: 1. Interval placement of new left-sided PICC line, but distal tip is abnormal position as it is left of the spine. This is concerning for inadvertent intra-arterial position. Clinical correlation is recommended. These results will be called to the ordering clinician or representative by the Radiologist Assistant, and communication documented in the PACS or zVision Dashboard. 2. Stable diffuse lung opacities are noted consistent with pneumonia. Electronically Signed   By: Marijo Conception M.D.   On: 01/12/2019 08:54   DG Chest Port 1  View  Result Date: 01/11/2019 CLINICAL DATA:  64 year old female COVID-19. EXAM: PORTABLE CHEST 1 VIEW COMPARISON:  01/10/2019 portable chest and earlier. FINDINGS: Prone PA view at 0410 hours. Endotracheal tube tip is 10 millimeters above the carina. Left IJ central line and right PICC line have not significantly changed. Feeding tube courses to the abdomen, tip not included. Diffuse coarse pulmonary interstitial opacity with areas of increasing perihilar confluence and air bronchograms since yesterday. Stable lung volumes. Increasing obscuration of mediastinal contours. No pneumothorax, although there is possibly a small left pleural effusion visible at the apex. IMPRESSION: 1. Stable lines and tubes. 2. Severe diffuse interstitial opacity with increasing perihilar confluence and air bronchograms since yesterday. 3. Possible small left pleural effusion visible in the apex. Electronically Signed   By: Genevie Ann M.D.   On: 01/11/2019 07:38   DG CHEST PORT 1 VIEW  Result Date: 01/10/2019 CLINICAL DATA:  PICC placement EXAM: PORTABLE CHEST 1 VIEW COMPARISON:  01/10/2019, 5:22 a.m. FINDINGS: Interval placement of right upper extremity PICC, tip positioned near the superior cavoatrial junction. Otherwise unchanged examination with extensive, diffuse interstitial airspace pulmonary opacity superimposed upon emphysema. Extensive support apparatus including endotracheal tube. IMPRESSION: 1. Interval placement of right upper extremity PICC, tip positioned near the superior cavoatrial junction. 2. Otherwise stable exam with extensive, diffuse interstitial airspace opacity superimposed upon emphysema, consistent with infection or edema. Electronically Signed   By: Eddie Candle M.D.   On: 01/10/2019 18:37   DG Chest Port 1 View  Result Date: 01/10/2019 CLINICAL  DATA:  Acute respiratory failure with hypoxemia EXAM: PORTABLE CHEST 1 VIEW COMPARISON:  Two days ago FINDINGS: Endotracheal tube tip at the clavicular  heads. The feeding tube at least reaches the stomach. Left IJ line with tip in the midline, stable. Unchanged extensive airspace disease. Generous heart size likely from mediastinal fat and low volumes based on November 2020 CT. IMPRESSION: Stable hardware positioning and bilateral airspace disease. Electronically Signed   By: Monte Fantasia M.D.   On: 01/10/2019 08:12   DG CHEST PORT 1 VIEW  Result Date: 01/08/2019 CLINICAL DATA:  Acute respiratory failure. EXAM: PORTABLE CHEST 1 VIEW COMPARISON:  01/06/2019 FINDINGS: Endotracheal tube has tip 2.8 cm above the carina. Enteric tube courses into the stomach with tip over the stomach in the left upper quadrant. Left IJ central venous catheter unchanged. Lungs are adequately inflated demonstrate persistent bilateral mixed interstitial airspace opacification over the mid to lower lungs without significant change. Cardiomediastinal silhouette and remainder of the exam is unchanged. IMPRESSION: 1. Bilateral mixed interstitial airspace process over the mid to lungs. 2.  Tubes and lines as described. Electronically Signed   By: Marin Olp M.D.   On: 01/08/2019 08:33   DG Chest Port 1 View  Result Date: 01/06/2019 CLINICAL DATA:  Respiratory failure EXAM: PORTABLE CHEST 1 VIEW COMPARISON:  01/05/2019 FINDINGS: Endotracheal tube has tip 2.8 cm above the carina. Enteric tube courses into the stomach and off the film as tip is not visualized. Left IJ central venous catheter unchanged. Lungs are adequately inflated demonstrate diffuse bilateral mixed interstitial airspace process with slightly improved aeration of the left lung. Possible small amount left pleural fluid. Cardiomediastinal silhouette and remainder of the exam is unchanged. IMPRESSION: Persistent bilateral mixed interstitial airspace process with slight interval improved aeration of the left lung. Possible small amount left pleural fluid. Tubes and lines as described. Electronically Signed   By: Marin Olp M.D.   On: 01/06/2019 07:10   DG Chest Port 1 View  Result Date: 01/05/2019 CLINICAL DATA:  Respiratory failure, COVID-19 EXAM: PORTABLE CHEST 1 VIEW COMPARISON:  Portable exam 0708 hours compared to 01/03/2019 FINDINGS: Tip of endotracheal tube projects 6.0 cm above carina. Feeding tube extends into stomach. LEFT jugular central venous catheter with tip projecting over brachiocephalic vein. Severe diffuse BILATERAL airspace infiltrates unchanged. Heart size obscured. No pleural effusion or pneumothorax. IMPRESSION: Persistent severe diffuse BILATERAL airspace infiltrates. Electronically Signed   By: Lavonia Dana M.D.   On: 01/05/2019 14:07   DG Chest Port 1 View  Result Date: 01/03/2019 CLINICAL DATA:  Acute respiratory failure and hypoxia COVID-19 pneumonia. EXAM: PORTABLE CHEST 1 VIEW COMPARISON:  One-view chest x-ray 01/02/2019 FINDINGS: Heart is mildly enlarged. Endotracheal tube terminates 4.5 cm above the carina. Left IJ line is stable. Feeding tube extends into the stomach. Interstitial and airspace opacities are again seen bilaterally. Aeration at the right base is slightly improved. IMPRESSION: 1. Stable appearance of interstitial and airspace disease bilaterally. Findings are consistent with ARDS or infection in the setting of COVID-19. 2. Support apparatus is stable. Electronically Signed   By: San Morelle M.D.   On: 01/03/2019 07:40   DG CHEST PORT 1 VIEW  Result Date: 01/02/2019 CLINICAL DATA:  Respiratory failure and hypoxia. EXAM: PORTABLE CHEST 1 VIEW COMPARISON:  01/01/2019 FINDINGS: ETT tip in satisfactory position above the carina. There is a left IJ catheter with tip at the SVC. Normal heart size. Persistent bilateral interstitial pulmonary opacities are unchanged from previous exam. IMPRESSION: 1.  No change in aeration to the lungs compared with previous exam. 2. Stable support apparatus. Electronically Signed   By: Kerby Moors M.D.   On: 01/02/2019 09:11   DG  CHEST PORT 1 VIEW  Result Date: 01/01/2019 CLINICAL DATA:  Acute respiratory failure with hypoxemia. COVID-19. EXAM: PORTABLE CHEST 1 VIEW COMPARISON:  12/30/2018 FINDINGS: Endotracheal tube and central venous catheter appear unchanged. NG tube has been exchanged for feeding tube and the tip is below the diaphragm. The extensive interstitial pulmonary infiltrates are essentially unchanged. Heart size and vascularity are normal. No effusions. No acute bone abnormality. IMPRESSION: Stable extensive interstitial infiltrates. Electronically Signed   By: Lorriane Shire M.D.   On: 01/01/2019 18:36   DG CHEST PORT 1 VIEW  Result Date: 12/30/2018 CLINICAL DATA:  Respiratory failure, central line placement. EXAM: PORTABLE CHEST 1 VIEW COMPARISON:  Multiple exams, including 12/30/2018 FINDINGS: Endotracheal tube tip is 4.4 cm above the carina. Orogastric tube extends into the stomach. Left IJ central line tip: Upper SVC. No appreciable pneumothorax. Coarse bilateral interstitial opacities with hazy mid lung and basilar airspace opacities. Pleural thickening at the lung apices. Underlying emphysema. Thoracic spondylosis. IMPRESSION: 1. Endotracheal tube tip is 4.4 cm above the carina. 2. Left IJ central line tip: SVC. No pneumothorax. 3. Coarse interstitial opacities with hazy mid lung and basilar airspace opacities. The appearance could reflect atypical pneumonia or edema. 4.  Emphysema (ICD10-J43.9). Electronically Signed   By: Van Clines M.D.   On: 12/30/2018 17:42   DG CHEST PORT 1 VIEW  Result Date: 12/30/2018 CLINICAL DATA:  Shortness of breath. EXAM: PORTABLE CHEST 1 VIEW COMPARISON:  December 28, 2018. FINDINGS: Stable cardiomediastinal silhouette. Stable bilateral lung opacities are noted concerning for pneumonia. No pneumothorax or pleural effusion is noted. Bony thorax is unremarkable. IMPRESSION: Stable bilateral lung opacities are noted concerning for pneumonia. Continued radiographic follow-up  is recommended to ensure resolution. Electronically Signed   By: Marijo Conception M.D.   On: 12/30/2018 07:52   DG Abd Portable 1V  Result Date: 01/10/2019 CLINICAL DATA:  Vomiting. EXAM: PORTABLE ABDOMEN - 1 VIEW COMPARISON:  None. FINDINGS: Persistent diffuse interstitial and airspace process in the lungs and suspected small effusions. The feeding tube is coursing down the esophagus and is coiled in the stomach. The abdominal bowel gas pattern is unremarkable. Relative paucity of bowel gas which could be due to fluid-filled loops of bowel. I do not see any obvious distension. No free air. The bony structures are unremarkable. IMPRESSION: 1. Feeding tube is coiled in the stomach. 2. Stable diffuse interstitial and airspace process in the lungs. 3. No findings for obstruction or free air. Electronically Signed   By: Marijo Sanes M.D.   On: 01/10/2019 10:23   ECHOCARDIOGRAM COMPLETE  Result Date: 12/28/2018   ECHOCARDIOGRAM REPORT   Patient Name:   MARIAISABEL DIOTTE Date of Exam: 12/28/2018 Medical Rec #:  [id number]           Height:       65.0 in Accession #:    [id number]          Weight:       145.8 lb Date of Birth:  [birth year]            BSA:          1.73 m Patient Age:    12 years            BP:           89/64  mmHg Patient Gender: F                   HR:           78 bpm. Exam Location:  Inpatient Procedure: 2D Echo Indications:    Dyspnea 786.09 / R06.00  History:        Patient has no prior history of Echocardiogram examinations.                 Risk Factors:Dyslipidemia and hypotension. COVID-19 viral                 pneumonia, sarcoidosis.  Sonographer:    Darlina Sicilian RDCS Referring Phys: [id number] [place]  1. Left ventricular ejection fraction, by visual estimation, is 60 to 65%. The left ventricle has normal function. There is no left ventricular hypertrophy.  2. Left ventricular diastolic parameters are consistent with Grade I diastolic dysfunction (impaired  relaxation).  3. Global right ventricle has normal systolic function.The right ventricular size is normal.  4. Left atrial size was normal.  5. Right atrial size was normal.  6. The mitral valve is normal in structure. Trace mitral valve regurgitation. No evidence of mitral stenosis.  7. The tricuspid valve is normal in structure. Tricuspid valve regurgitation is mild.  8. The aortic valve is tricuspid. Aortic valve regurgitation is not visualized. No evidence of aortic valve sclerosis or stenosis.  9. The pulmonic valve was normal in structure. Pulmonic valve regurgitation is trivial. 10. Mildly elevated pulmonary artery systolic pressure. 11. Technically difficult; normal LV systolic function; grade 1 diastolic dysfunction. FINDINGS  Left Ventricle: Left ventricular ejection fraction, by visual estimation, is 60 to 65%. The left ventricle has normal function. There is no left ventricular hypertrophy. Left ventricular diastolic parameters are consistent with Grade I diastolic dysfunction (impaired relaxation). Normal left atrial pressure. Right Ventricle: The right ventricular size is normal.Global RV systolic function is has normal systolic function. The tricuspid regurgitant velocity is 2.58 m/s, and with an assumed right atrial pressure of 8 mmHg, the estimated right ventricular systolic pressure is mildly elevated at 34.6 mmHg. Left Atrium: Left atrial size was normal in size. Right Atrium: Right atrial size was normal in size Pericardium: There is no evidence of pericardial effusion. Mitral Valve: The mitral valve is normal in structure. No evidence of mitral valve stenosis by observation. Trace mitral valve regurgitation. Tricuspid Valve: The tricuspid valve is normal in structure. Tricuspid valve regurgitation is mild. Aortic Valve: The aortic valve is tricuspid. Aortic valve regurgitation is not visualized. The aortic valve is structurally normal, with no evidence of sclerosis or stenosis. Pulmonic Valve:  The pulmonic valve was normal in structure. Pulmonic valve regurgitation is trivial. Aorta: The aortic root is normal in size and structure. Venous: The inferior vena cava was not well visualized.   LEFT VENTRICLE PLAX 2D LVIDd:         2.97 cm  Diastology LVIDs:         1.99 cm  LV e' lateral:   8.81 cm/s LV PW:         0.91 cm  LV E/e' lateral: 9.7 LV IVS:        0.90 cm  LV e' medial:    7.83 cm/s LVOT diam:     1.70 cm  LV E/e' medial:  10.9 LV SV:         22 ml LV SV Index:   12.33 LVOT Area:     2.27  cm  RIGHT VENTRICLE RV S prime:     12.70 cm/s LEFT ATRIUM             Index LA diam:        2.70 cm 1.56 cm/m LA Vol (A2C):   29.8 ml 17.23 ml/m LA Vol (A4C):   38.2 ml 22.09 ml/m LA Biplane Vol: 33.7 ml 19.49 ml/m  AORTIC VALVE LVOT Vmax:   147.00 cm/s LVOT Vmean:  90.900 cm/s LVOT VTI:    0.256 m  AORTA Ao Root diam: 2.40 cm MITRAL VALVE                        TRICUSPID VALVE MV Area (PHT): 2.91 cm             TR Peak grad:   26.6 mmHg MV PHT:        75.69 msec           TR Vmax:        258.00 cm/s MV Decel Time: 261 msec MV E velocity: 85.40 cm/s 103 cm/s  SHUNTS MV A velocity: 74.70 cm/s 70.3 cm/s Systemic VTI:  0.26 m MV E/A ratio:  1.14       1.5       Systemic Diam: 1.70 cm  Kirk Ruths MD Electronically signed by Kirk Ruths MD Signature Date/Time: 12/28/2018/2:07:44 PM    Final    ECHOCARDIOGRAM LIMITED  Result Date: 01/02/2019   ECHOCARDIOGRAM LIMITED REPORT   Patient Name:   MAI KOTTLER Date of Exam: 01/02/2019 Medical Rec #:  [id number]           Height:       65.0 in Accession #:    [id number]          Weight:       173.5 lb Date of Birth:  17-Dec-1955            BSA:          1.86 m Patient Age:    36 years            BP:           115/52 mmHg Patient Gender: F                   HR:           76 bpm. Exam Location:  Inpatient  Procedure: Limited Echo, Limited Color Doppler and Cardiac Doppler Indications:    Dyspnea 786.09 / R06.00  History:        Patient has prior history of  Echocardiogram examinations, most                 recent 12/28/2018. Sarcoidosis, COVID [place].  Sonographer:    Darlina Sicilian RDCS Referring Phys: [place]  1. Left ventricular ejection fraction, by visual estimation, is 55 to 60%. The left ventricle has normal function. There is no left ventricular hypertrophy.  2. Left ventricular diastolic parameters are indeterminate.  3. Global right ventricle has normal systolic function.The right ventricular size is moderately enlarged. No increase in right ventricular wall thickness.  4. Left atrial size was normal.  5. Right atrial size was mildly dilated.  6. The mitral valve is normal in structure. Trace mitral valve regurgitation. No evidence of mitral stenosis.  7. The tricuspid valve is normal in structure. Tricuspid valve regurgitation is mild.  8. The aortic valve is normal in structure. Aortic  valve regurgitation is not visualized. No evidence of aortic valve sclerosis or stenosis.  9. The pulmonic valve was normal in structure. Pulmonic valve regurgitation is trivial. 10. Mildly elevated pulmonary artery systolic pressure. 11. The inferior vena cava is dilated in size with <50% respiratory variability, suggesting right atrial pressure of 15 mmHg. FINDINGS  Left Ventricle: Left ventricular ejection fraction, by visual estimation, is 55 to 60%. The left ventricle has normal function. There is no left ventricular hypertrophy. Left ventricular diastolic parameters are indeterminate. Normal left atrial pressure. Right Ventricle: The right ventricular size is moderately enlarged. No increase in right ventricular wall thickness. Global RV systolic function is has normal systolic function. The tricuspid regurgitant velocity is 2.50 m/s, and with an assumed right atrial pressure of 15 mmHg, the estimated right ventricular systolic pressure is mildly elevated at 39.9 mmHg. Left Atrium: Left atrial size was normal in size. Right Atrium:  Right atrial size was mildly dilated Pericardium: There is no evidence of pericardial effusion. Mitral Valve: The mitral valve is normal in structure. No evidence of mitral valve stenosis by observation. MV Area by PHT, 4.15 cm. MV PHT, 53.07 msec. Trace mitral valve regurgitation. Tricuspid Valve: The tricuspid valve is normal in structure. Tricuspid valve regurgitation is mild. Aortic Valve: The aortic valve is normal in structure. Aortic valve regurgitation is not visualized. The aortic valve is structurally normal, with no evidence of sclerosis or stenosis. Pulmonic Valve: The pulmonic valve was normal in structure. Pulmonic valve regurgitation is trivial. Aorta: The aortic root, ascending aorta and aortic arch are all structurally normal, with no evidence of dilitation or obstruction. Venous: The inferior vena cava is dilated in size with less than 50% respiratory variability, suggesting right atrial pressure of 15 mmHg. Shunts: There is no evidence of a patent foramen ovale. No ventricular septal defect is seen or detected. There is no evidence of an atrial septal defect. No atrial level shunt detected by color flow Doppler.  LEFT VENTRICLE          Normals PLAX 2D LVOT diam:     1.90 cm  2.0 cm       Diastology                  Normals LVOT Area:     2.84 cm 3.14 cm2     LV e' lateral:   10.00 cm/s 6.42 cm/s                                      LV E/e' lateral: 7.9        15.4                                      LV e' medial:    7.29 cm/s  6.96 cm/s LV Volumes (MOD)             Normals LV E/e' medial:  10.8       6.96 LV area d, A2C:    28.20 cm LV area d, A4C:    24.10 cm LV area s, A2C:    15.30 cm LV area s, A4C:    15.80 cm LV major d, A2C:   7.52 cm LV major d, A4C:   7.16 cm LV major s, A2C:   5.86 cm LV major s, A4C:  6.20 cm LV vol d, MOD A2C: 85.5 ml   68 ml LV vol d, MOD A4C: 66.8 ml LV vol s, MOD A2C: 33.5 ml   24 ml LV vol s, MOD A4C: 34.0 ml LV SV MOD A2C:     52.0 ml LV SV MOD A4C:      66.8 ml LV SV MOD BP:      42.5 ml   45 ml RIGHT VENTRICLE RV S prime:     12.70 cm/s TAPSE (M-mode): 2.0 cm RIGHT ATRIUM           Index RA Area:     18.00 cm RA Volume:   54.60 ml  29.32 ml/m  AORTIC VALVE             Normals LVOT Vmax:   119.00 cm/s LVOT Vmean:  71.900 cm/s 75 cm/s LVOT VTI:    0.202 m     25.3 cm MITRAL VALVE              Normals   TRICUSPID VALVE             Normals MV Area (PHT): 4.15 cm             TR Peak grad:   24.9 mmHg MV PHT:        53.07 msec 55 ms     TR Vmax:        276.00 cm/s 288 cm/s MV Decel Time: 183 msec   187 ms MV E velocity: 78.60 cm/s 103 cm/s  SHUNTS MV A velocity: 69.40 cm/s 70.3 cm/s Systemic VTI:  0.20 m MV E/A ratio:  1.13       1.5       Systemic Diam: 1.90 cm  Fransico Him MD Electronically signed by Fransico Him MD Signature Date/Time: 01/02/2019/9:37:53 AM    Final    [place] EKG SITE RITE  Result Date: 01/10/2019 If Site Rite image not attached, placement could not be confirmed due to current cardiac rhythm.   Microbiology Recent Results (from the past 240 hour(s))  MRSA PCR Screening     Status: Abnormal   Collection Time: 01/11/19  4:50 AM   Specimen: Nasal Mucosa; Nasopharyngeal  Result Value Ref Range Status   MRSA by PCR POSITIVE (A) NEGATIVE Final    Comment:        The GeneXpert MRSA Assay (FDA approved for NASAL specimens only), is one component of a comprehensive MRSA colonization surveillance program. It is not intended to diagnose MRSA infection nor to guide or monitor treatment for MRSA infections. RESULT CALLED TO, READ BACK BY AND VERIFIED WITH: L.SPARKS,RN [id number] @1500  BY V.WILKINS Performed at [facility], [place] [address], [place], [place] [address]   Culture, respiratory (non-expectorated)     Status: None   Collection Time: 01/13/19  5:30 PM   Specimen: Tracheal Aspirate; Respiratory  Result Value Ref Range Status   Specimen Description   Final    TRACHEAL ASPIRATE Performed at [place] [address], [place], [place] [address]    Special Requests   Final    NONE Performed at [facility], [place] [address], [place], [place] [address]    Gram Stain   Final    ABUNDANT WBC PRESENT, PREDOMINANTLY PMN ABUNDANT GRAM POSITIVE COCCI Performed at [facility], [place] [address] [place],  [address]    Culture   Final    ABUNDANT METHICILLIN RESISTANT STAPHYLOCOCCUS AUREUS  Report Status 01/16/2019 FINAL  Final   Organism ID, Bacteria METHICILLIN RESISTANT STAPHYLOCOCCUS AUREUS  Final      Susceptibility   Methicillin resistant staphylococcus aureus - MIC*    CIPROFLOXACIN <=0.5 SENSITIVE Sensitive     ERYTHROMYCIN >=8 RESISTANT Resistant     GENTAMICIN <=0.5 SENSITIVE Sensitive     OXACILLIN >=4 RESISTANT Resistant     TETRACYCLINE >=16 RESISTANT Resistant     VANCOMYCIN <=0.5 SENSITIVE Sensitive     TRIMETH/SULFA <=10 SENSITIVE Sensitive     CLINDAMYCIN >=8 RESISTANT Resistant     RIFAMPIN <=0.5 SENSITIVE Sensitive     Inducible Clindamycin NEGATIVE Sensitive     * ABUNDANT METHICILLIN RESISTANT STAPHYLOCOCCUS AUREUS    Lab Basic Metabolic Panel: Recent Labs  Lab 01/13/19 0514 01/14/19 0547 01/15/19 0545 01/15/19 0631 01/15/19 1214 01/16/19 0315 01/16/19 1017 January 20, 2019 0415  NA 144 142 141 137 136 142 137 141  K 5.2* 3.7 2.8* 3.4* 3.5 4.8 4.9 5.4*  CL 95* 98 99  --   --  91*  --  93*  CO2 44* 33* 34*  --   --  43*  --  40*  GLUCOSE 139* 248* 176*  --   --  182*  --  163*  BUN 38* 28* 25*  --   --  31*  --  46*  CREATININE 0.58 0.42* 0.44  --   --  0.58  --  0.76  CALCIUM 10.1 8.1* 7.0*  --   --  9.3  --  9.5  MG  --  1.7 1.3*  --   --  2.4  --  2.7*  PHOS  --   --   --   --   --  5.3*  --   --    Liver Function Tests: No results for input(s): AST, ALT, ALKPHOS, BILITOT, PROT, ALBUMIN in the last 168 hours. No results for input(s): LIPASE, AMYLASE in the last 168 hours. No results for input(s): AMMONIA  in the last 168 hours. CBC: Recent Labs  Lab 01/12/19 0637 01/13/19 0514 01/15/19 0545 01/15/19 0631 01/15/19 1214 01/16/19 0315 01/16/19 1017 01-20-19 0415  WBC 16.7* 26.3* 27.5*  --   --  27.1*  --  21.8*  HGB 10.3* 9.5* 8.0* 10.2* 10.2* 9.4* 10.2* 8.8*  HCT 33.6* 31.5* 27.4* 30.0* 30.0* 33.2* 30.0* 31.5*  MCV 107.0* 107.9* 108.3*  --   --  110.7*  --  111.3*  PLT 341 342 323  --   --  352  --  331   Cardiac Enzymes: No results for input(s): CKTOTAL, CKMB, CKMBINDEX, TROPONINI in the last 168 hours. Sepsis Labs: Recent Labs  Lab 01/12/19 [id number] 01/13/19 0514 01/14/19 0547 01/15/19 0545 01/16/19 0315 January 20, 2019 0415  PROCALCITON 0.29 1.35 1.40  --   --   --   WBC 16.7* 26.3*  --  27.5* 27.1* 21.8*    Procedures/Operations  ETT 11/30 >> L IJ TLC 11/30 >>  R Rad Aline 12/1 >>  RUE PICC 12/12 >>   Simonne Maffucci Jan 20, 2019, 4:44 PM

## 2019-01-31 NOTE — Progress Notes (Signed)
NAME:  Kim Harrison, MRN:  [id number], DOB:  1955/04/19, LOS: 2 ADMISSION DATE:  12/17/2018, CONSULTATION DATE: 11/30 REFERRING MD: Dr. Sherral Hammers, CHIEF COMPLAINT: Shortness of breath  Brief History   64 year old female admitted 11/9 with a 10-day history of shortness of breath.  Outpatient testing for Covid was initially negative, then positive on 10/30. As an outpatient she completed 2 rounds of antibiotics and steroids. She presented to [facility] on 11/9 after a syncopal episode and was found to be COVID-19 positive.  After transfer to [facility] the patient was treated with steroids, remdesivir, convalescent plasma and 2 doses of tocilizumab.  She required high flow oxygen.  Follow-up CTA imaging of the chest revealed bilateral groundglass opacities, septal thickening and no PE.  Course notable for worsening on 11/27 with hypotension and worsening of left lower lobe infiltrate.  Cultures were obtained and the patient was treated for suspected superimposed bacterial pneumonia.  On 11/30 she decompensated requiring intubation.   Past Medical History  [facility] Events   11/09 Admit 11/14 Transfer to ICU  11/17 Transfer to PCU 11/27 Worsening of hypoxemic respiratory failure 11/29 Transfer back to ICU for worsening hypoxemia  11/30 Decompensation requiring intubation, paralytics 12/01 Prone positioning, acidosis overnight 12/02 New AF, later RVR in pm.  Prone held.  RBBB on EKG. Bedside ECHO w/o RV fx/septal bowing 12/04 Prone positioning 12/06 Prone held due to facial swelling  12/07 Prone positioning held due to significant facial swelling. Peak 34, plat 34 FiO2 100, peep 16 12/09 Resume prone positioning, P/F ratio 118 12/11 Emesis early am while prone, TF held. Weaning vasopressors 12/12 Off vasopressors most of day, back on with sedation  12/13 Off vasopressors  12/14 spiking fever.  Sputum sent, started  on broad-spectrum antibiotics including aztreonam, Cipro, and vancomycin.  Developed new onset atrial fibrillation placed on amiodarone infusion, changed Vt to 94ml/kg.  Nimbex drip started 12/16 high pressures on vent. Plateau as high as 47. Improved somewhat with decreased Vt 12/17: Off from norepinephrine, on intermittent dose phenylephrine.  Pulmonary can ask worse, plateau pressures exceeding 40.  Goals of care discussion with husband we agreed on no escalation and DNR status, could not keep pulse oximetry above 80% in the supine position  Consults:    Procedures:  ETT 11/30 >> L IJ TLC 11/30 >>  R Rad Aline 12/1 >>  RUE PICC 12/12 >>  Significant Diagnostic Tests:   CTA Chest 11/9 >> negative for PE, interlobular septal thickening, bilateral groundglass opacities with lower lobe predominance  CTA Chest 11/14 >> negative for PE, moderate worsening of groundglass opacities and septal thickening since prior exam superimposed on changes of sarcoidosis  ECHO 11/28 >> LVEF 60 to 65%, LVH, grade 1 diastolic dysfunction, LA/RA normal size, trace MVR, mild TVR, no evidence of left ear, mildly elevated pulmonary artery systolic pressure  ECHO [id number] >> LVEF 55-60%, normal LV function, no LVH, global RV normal systolic function, RV moderately enlarged, LA normal, RA mildly dilated, trace MVR, mild TVR, PVR trivial, mildly elevated PAS pressure  Micro Data:  SARS CoV2 11/06 >> positive MRSA PCR 11/14 >> negative  BCx2 11/28 >> negative Sputum 12/14 >> MRSA  Antimicrobials:  Remdesivir 11/9 >> 11/14  Convalescent plasma 11/11  Toclizumab 11/11 & 11/13  Aztreonam 11/28 >> 12/4 azactam 12/14 > 12/16 cipro 12/14 > 12/16  vanc 12/14 >   Interim history/subjective:    Objective   Blood pressure 132/66, pulse 80, temperature 98.7 F (  37.1 C), temperature source Oral, resp. rate (Abnormal) 34, height 5\' 5"  (1.651 m), weight 85.8 kg, SpO2 (Abnormal) 83 %.    Vent Mode: PRVC FiO2 (%):   [100 %] 100 % Set Rate:  [34 bmp] 34 bmp Vt Set:  [340 mL] 340 mL PEEP:  [18 cmH20] 18 cmH20 Plateau Pressure:  [36 cmH20-40 cmH20] 40 cmH20   Intake/Output Summary (Last 24 hours) at January 20, 2019 0759 Last data filed at 01/20/19 0500 Gross per 24 hour  Intake 2689.88 ml  Output 630 ml  Net 2059.88 ml   Filed Weights   01/13/19 0458 01/15/19 0500 01/16/19 0500  Weight: 80.6 kg 86 kg 85.8 kg    Examination:   General 64 year old critically ill white female. Sedated and in prone position.  HENT: NCAT orally intubated.  Pulm: scattered rales. Equal chest rise  Current ventilator settings: Tidal volume 340, respiratory rate 34, PEEP 18, FiO2 100% P plat:40 Driving [id number] in prone position Card RRR on amiodarone gtt.  abd soft  Ext brisk CR; diffuse anasarca   Resolved Hospital Problem list   Elevated troponin from demand ischemia (preserved ER on Echo), Septic shock from HCAP, Elevated LFTs from shock Hypernatremia  Emesis Assessment & Plan:   Acute hypoxic, hypercapnic respiratory failure with ARDS: MRSA HCAP superimposed on recent COVID PNA and underlying Sarcoid: has made no improvement  Shock septic vs sedation related: Phenylephrine remains at 80 mcg PAF: on amiodarone gtt RBBB Acute metabolic encephalopathy Hx of anxiety Intermittent fluid and electrolyte imbalance:  Hyperkalemia, and hypermagnesemia Macrocytic Anemia  Hyperglycemia Hx of hypothyroidism  Discussion I had a long discussion with the husband today.  Unfortunately she is 6 days into treatment for MRSA pneumonia, this is superimposed on a prolonged hospital stay for Covid pneumonia and ARDS she remains in shock and is intolerant of the supine position for any extended period of time.  In discussing the patient's values yesterday the husband felt she would not want to continue aggressive support knowing the high likelihood of prolonged critical illness, lifelong disability, and even  concerned about ventilator liberation.  Based on this and knowing the patient's values her husband Shanon Brow has requested we transition to a compassionate extubation and comfort care.  Plan I have already discontinued neuromuscular blockade We will continue current sedation regimen Discontinue all lab work Discontinue capillary blood glucose and insulin Discontinue tube feeds Discontinue antibiotics Discontinue every medication that does not directly contribute to comfort Family will come in later this morning to say goodbye, we will place her in the supine position for this, after family has had an opportunity to visit we will bring her back the intensive care and remove the life support machine    Best practice:  Diet: TF DVT prophylaxis: fondaparinux GI prophylaxis: pepcid  Mobility: BR  Code Status: Full Code  Family Communication: Husband Shanon Brow [phone]) . Updated 12/16.   Burns Spain (daughter [phone]).  Wetzel Bjornstad (daughter [phone]).  Husband works as a Engineer, drilling and at times has limited cell signal.   Disposition: Plan for one-way extubation/compassionate extubation later today  Critical care time is 45 minutes   Erick Colace ACNP-BC [place] Pager # [phone] OR # [phone] if no answer]

## 2019-01-31 NOTE — Progress Notes (Signed)
   01/26/2019 1500  Clinical Encounter Type  Visited With Family  Visit Type Follow-up;Psychological support;Spiritual support  Referral From Family  Consult/Referral To Chaplain  Spiritual Encounters  Spiritual Needs Emotional;Grief support;Other (Comment) (Spiritual Care Conversation/Support)  Stress Factors  Patient Stress Factors Not reviewed  Family Stress Factors Health changes;Loss;Major life changes   I spoke with the family today before they visited with [facility] and then again at 3:00 pm. The family are very aware of the patient's condition and progress. They have reasonable expectations around her care. The family are supporting one another very well and wanted to talk about grief resources. I provided them with a list of grief resources via email to [place] daughter, Wells Guiles.  We discussed ways to support one another through this, and signs to look for if someone is particularly struggling.  The family requested that I continue to check in on them, which I will.   Please, contact Spiritual Care for further assistance.   Chaplain Shanon Ace M.Div., [facility]

## 2019-01-31 NOTE — Progress Notes (Signed)
Called pt's husband Shanon Brow to update of pt condition. Shanon Brow appreciates update.

## 2019-01-31 NOTE — Progress Notes (Signed)
Pt expired at 1531 pronounced by Brayton Layman RN and Darden Amber RN. Pt's spouse Shanon Brow notified. Shanon Brow appreciates all care pt has received.

## 2019-01-31 NOTE — Progress Notes (Signed)
Patient extubated per comfort care order set.  Extubated to room air.  No complications noted.  Will continue to monitor.

## 2019-01-31 NOTE — Progress Notes (Signed)
Pt taken to visitation room for family visit. Accompanied by RN and RT. Pt visited by spouse and 2 daughters. Family appreciative of care and accepts current plan of care. No s/s of distress at this time.

## 2019-01-31 DEATH — deceased

## 2020-06-07 IMAGING — DX DG CHEST 1V
1 series · 1 of 1 positions shown · non-contrast
Comparison: Chest x-ray 12/13/2018 and earlier. CTA chest
12/14/2018 and earlier.

CLINICAL DATA: 63-year-old with current history of sarcoidosis who
tested positive for [id number] earlier this month and had a CTA chest
2 weeks ago demonstrating worsening ground-glass opacities and
septal thickening throughout both lungs, possibly related to COVID
or pulmonary edema. Follow-up.

EXAM:
Portable CHEST 1 VIEW

[chest]
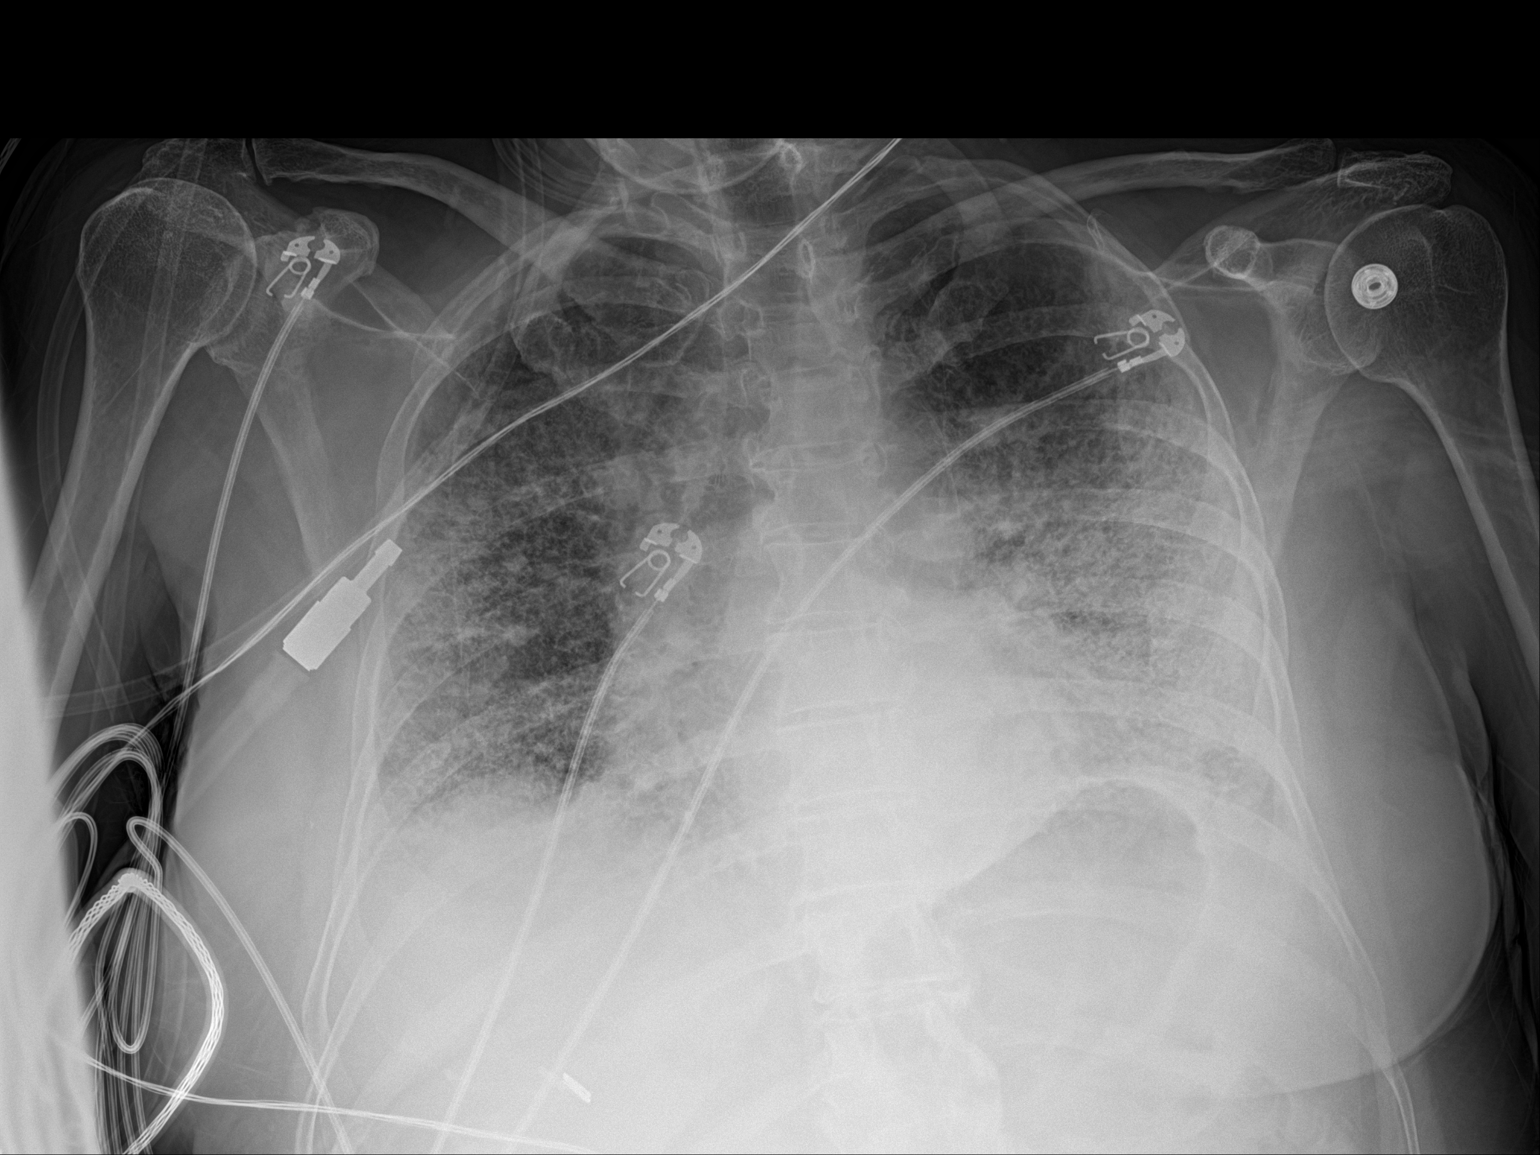

[1 of 1 positions shown; findings below may reference images not displayed]

FINDINGS: The ground-glass opacities and septal thickening throughout both
lungs is unchanged since the chest x-ray and CT 2 weeks ago. There
are new confluent airspace opacities with air bronchograms in the
MEDIAL LEFT LOWER LOBE. No new pulmonary parenchymal abnormalities
elsewhere.
IMPRESSION: 1. Stable ground-glass opacities throughout both lungs since the
chest x-ray and CT 2 weeks ago.
2. New confluent pneumonia in the MEDIAL LEFT LOWER LOBE.

## 2020-06-09 IMAGING — DX DG CHEST 1V PORT
1 series · 1 of 1 positions shown · non-contrast
Comparison: Multiple exams, including 12/30/2018

CLINICAL DATA: Respiratory failure, central line placement.

EXAM:
PORTABLE CHEST 1 VIEW

[chest ap]
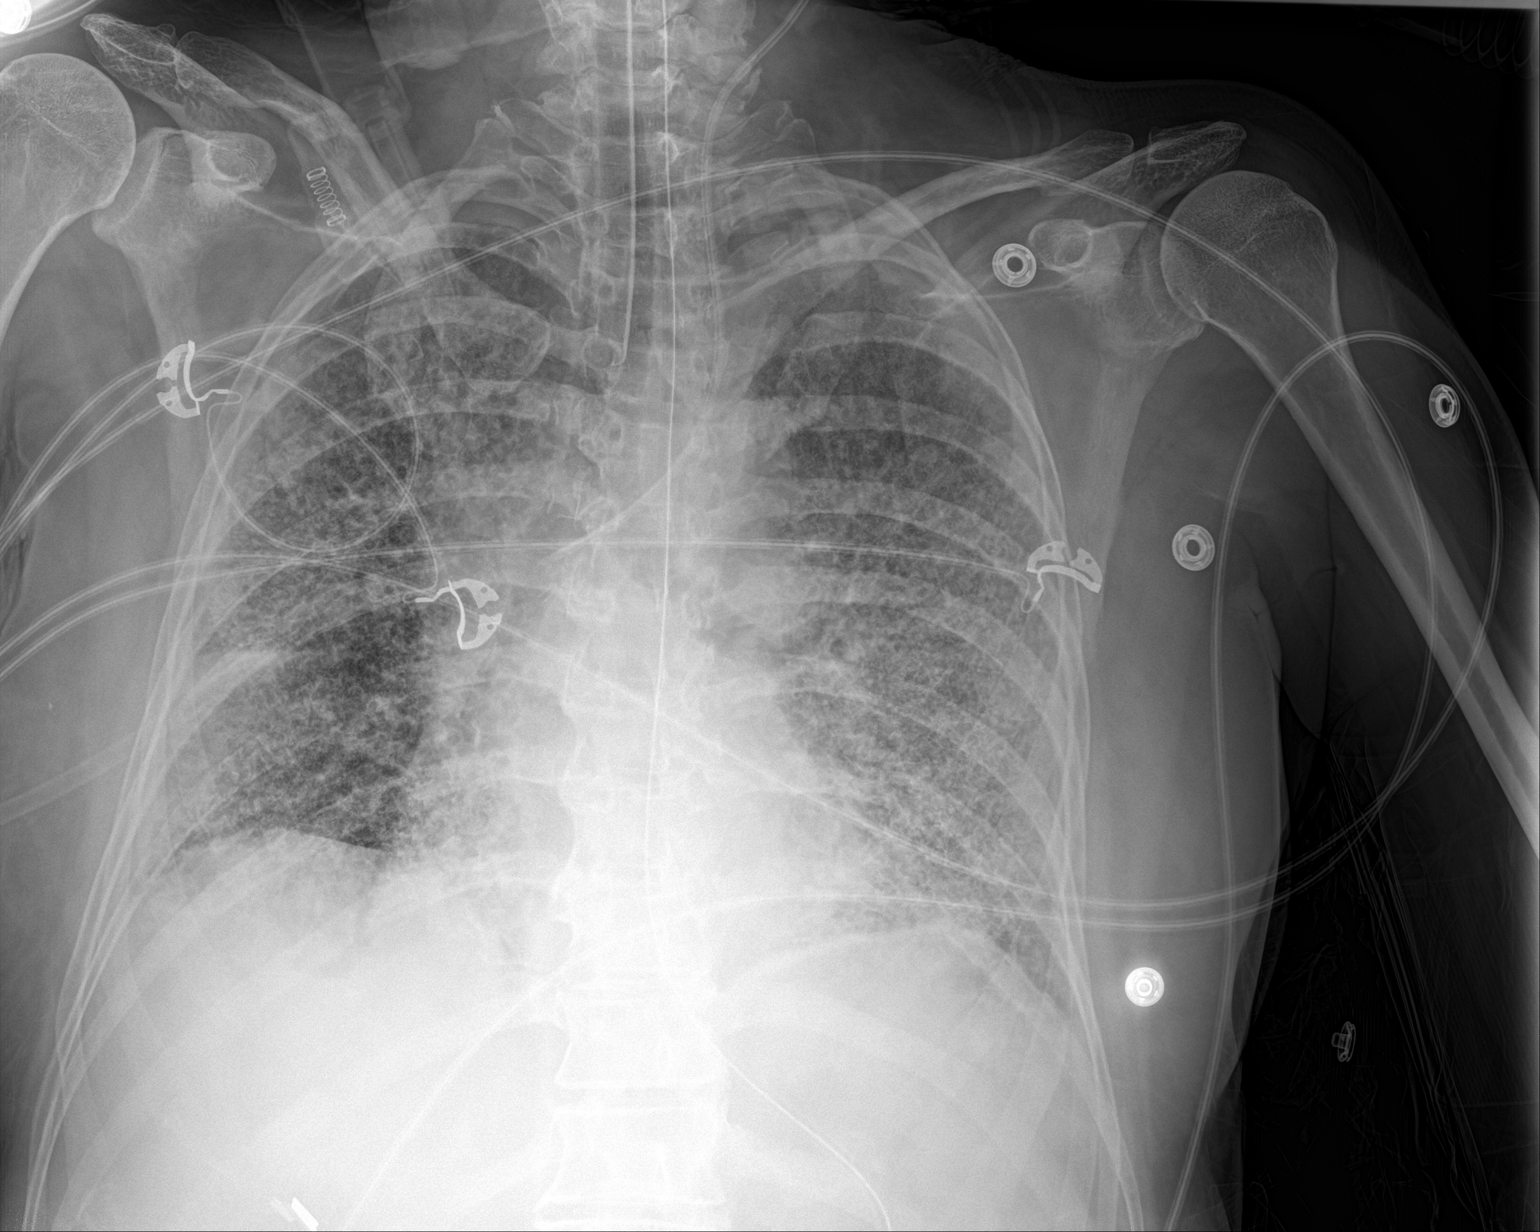

[1 of 1 positions shown; findings below may reference images not displayed]

FINDINGS: Endotracheal tube tip is 4.4 cm above the carina. Orogastric tube
extends into the stomach. Left IJ central line tip: Upper SVC.

No appreciable pneumothorax. Coarse bilateral interstitial opacities
with hazy mid lung and basilar airspace opacities. Pleural
thickening at the lung apices. Underlying emphysema.

Thoracic spondylosis.
IMPRESSION: 1. Endotracheal tube tip is 4.4 cm above the carina.
2. Left IJ central line tip: SVC. No pneumothorax.
3. Coarse interstitial opacities with hazy mid lung and basilar
airspace opacities. The appearance could reflect atypical pneumonia
or edema.
4.  Emphysema ([id number]).

## 2020-06-11 IMAGING — DX DG CHEST 1V PORT
1 series · 1 of 1 positions shown · non-contrast
Comparison: 12/30/2018

CLINICAL DATA: Acute respiratory failure with hypoxemia. [id number].

EXAM:
PORTABLE CHEST 1 VIEW

[chest ap]
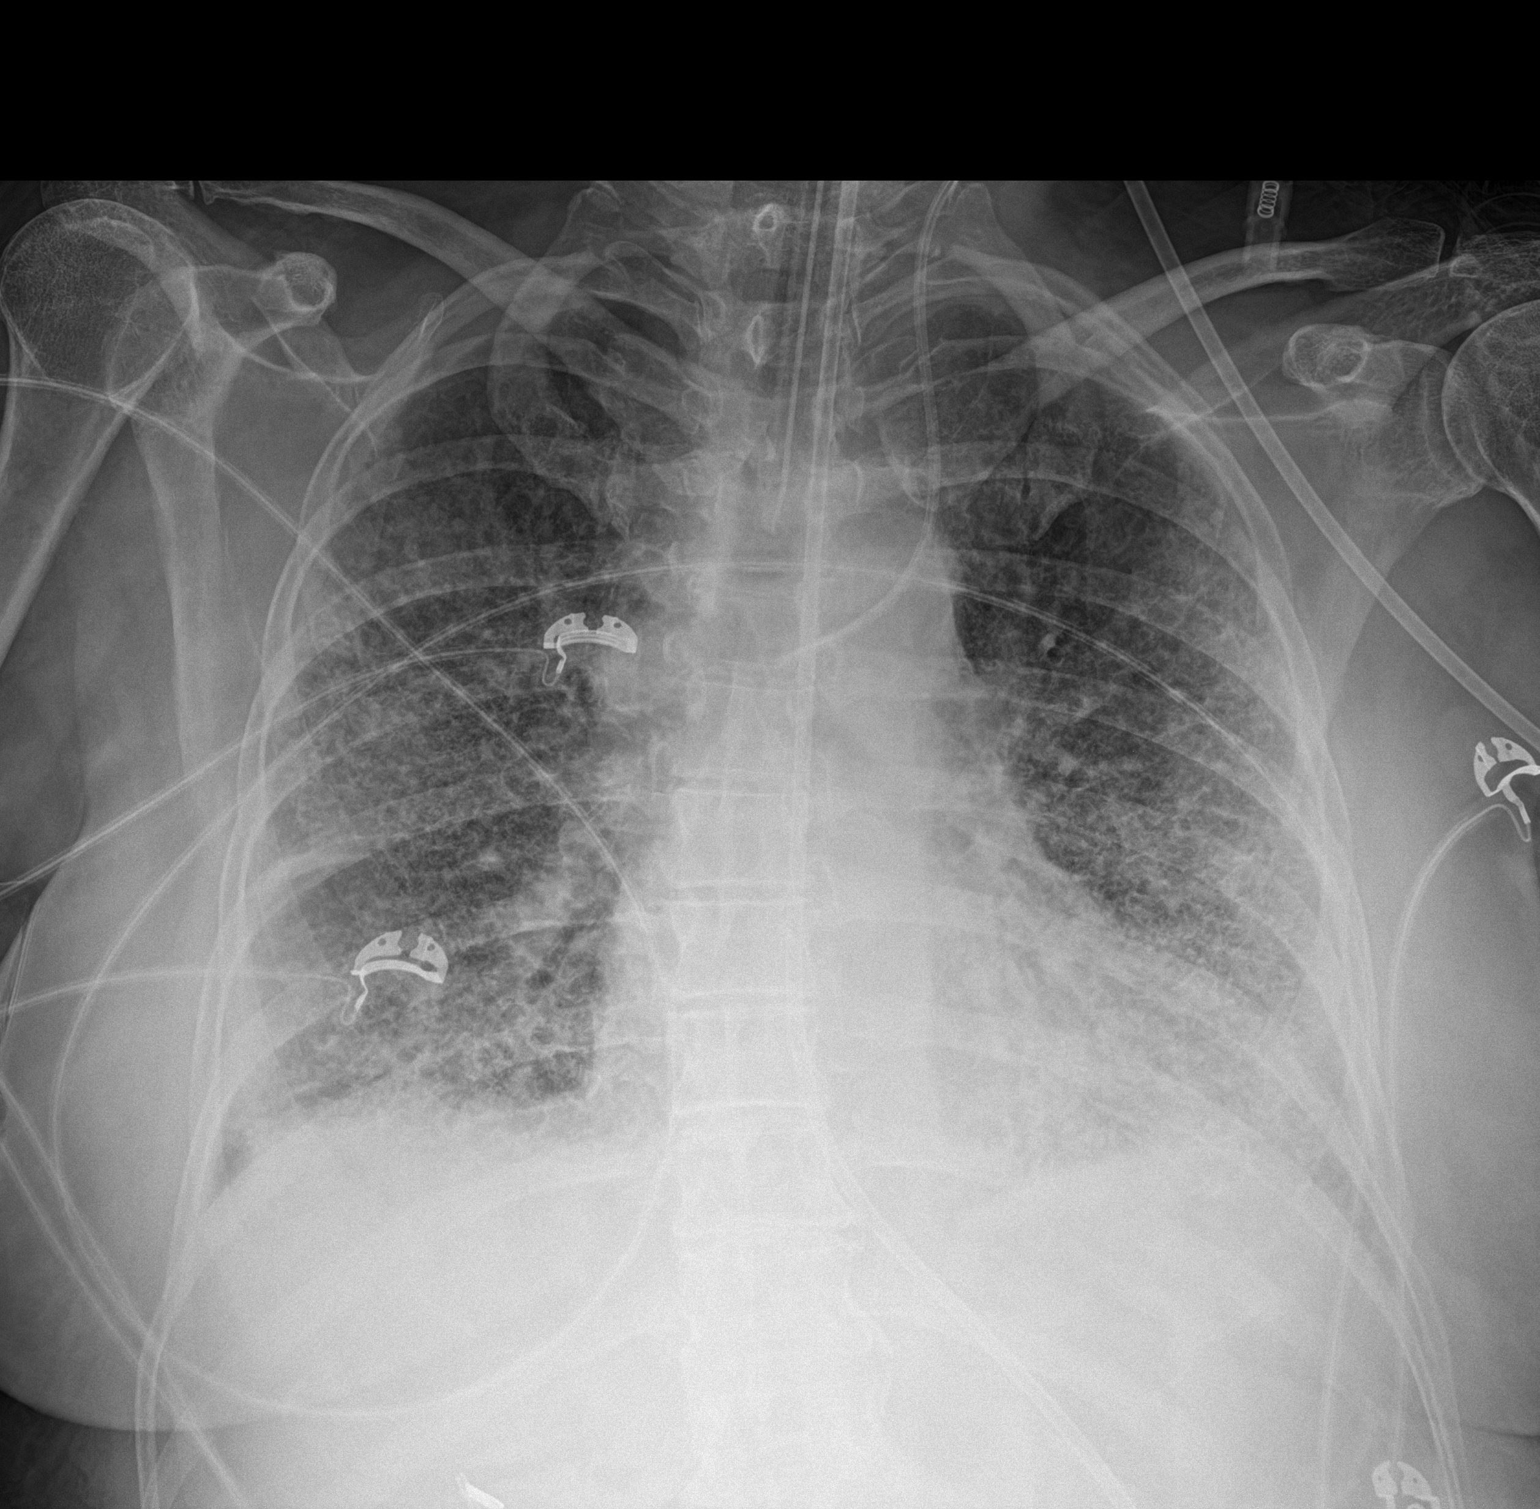

[1 of 1 positions shown; findings below may reference images not displayed]

FINDINGS: Endotracheal tube and central venous catheter appear unchanged. NG
tube has been exchanged for feeding tube and the tip is below the
diaphragm.

The extensive interstitial pulmonary infiltrates are essentially
unchanged. Heart size and vascularity are normal. No effusions. No
acute bone abnormality.
IMPRESSION: Stable extensive interstitial infiltrates.

## 2020-06-12 IMAGING — DX DG CHEST 1V PORT
1 series · 1 of 1 positions shown · non-contrast
Comparison: 01/01/2019

CLINICAL DATA: Respiratory failure and hypoxia.

EXAM:
PORTABLE CHEST 1 VIEW

[chest ap]
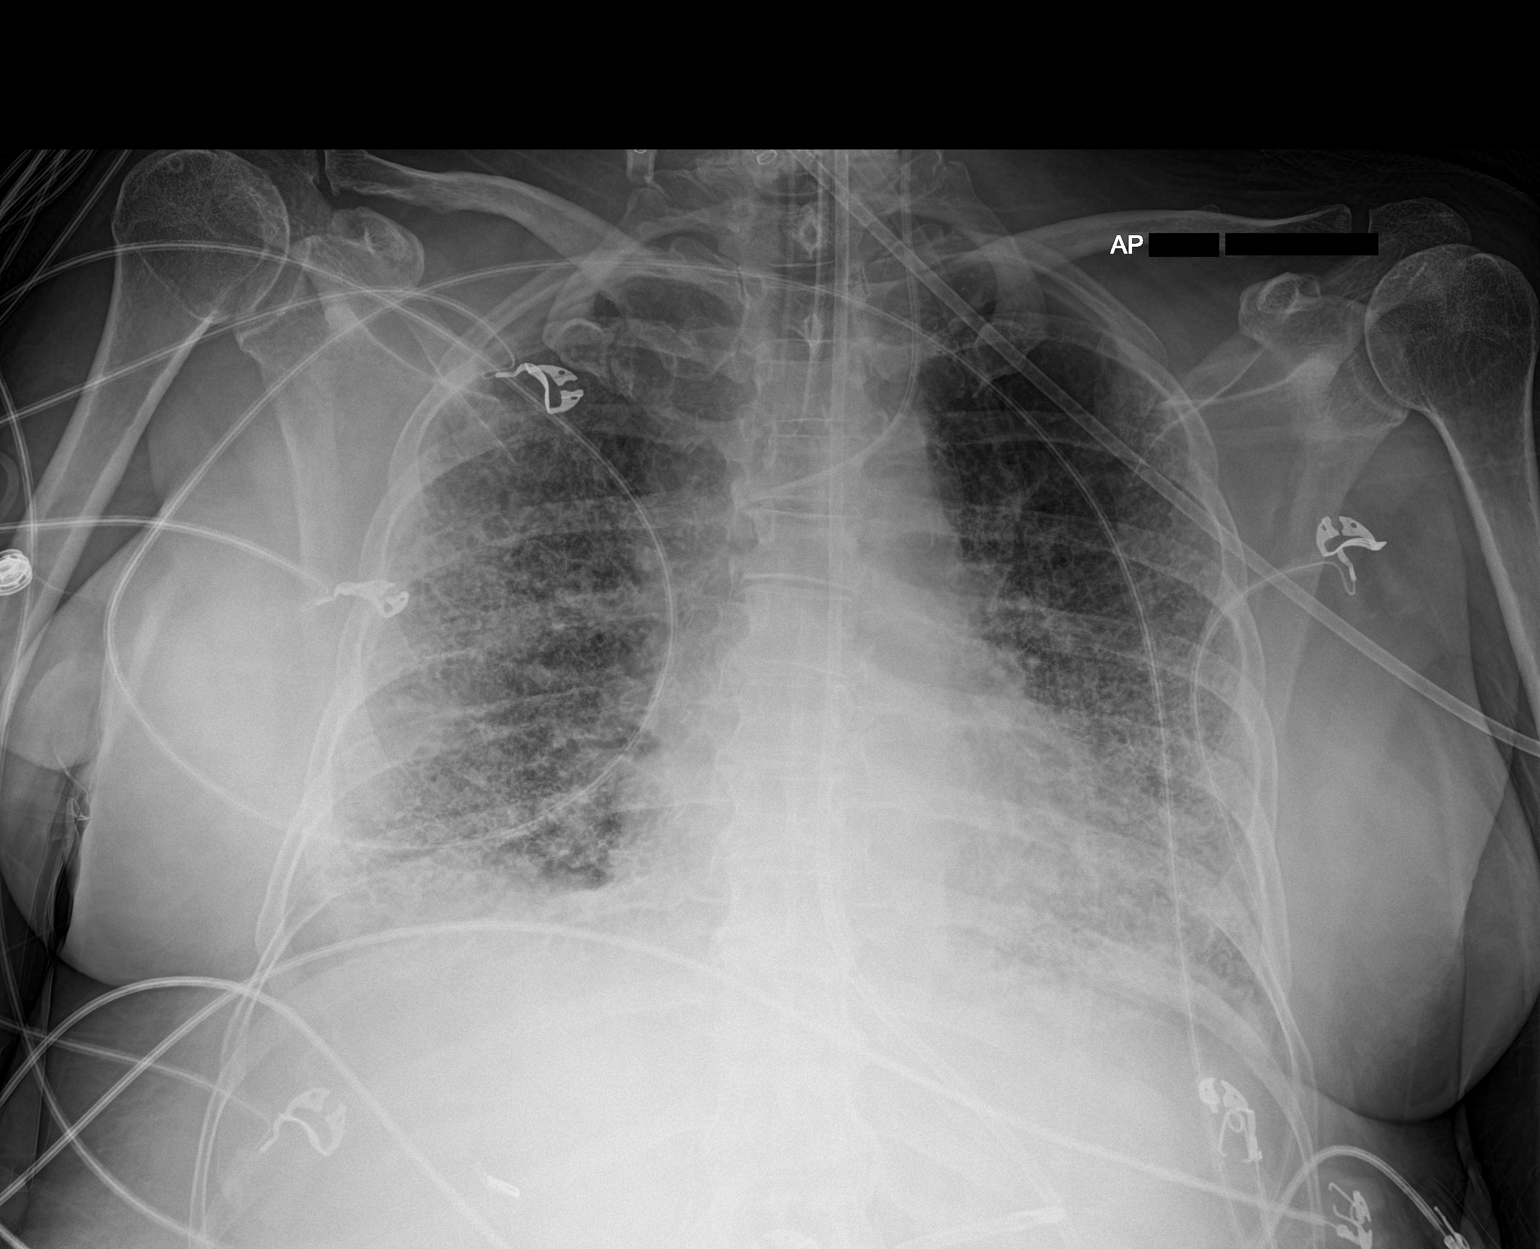

[1 of 1 positions shown; findings below may reference images not displayed]

FINDINGS: ETT tip in satisfactory position above the carina. There is a left
IJ catheter with tip at the SVC. Normal heart size. Persistent
bilateral interstitial pulmonary opacities are unchanged from
previous exam.
IMPRESSION: 1. No change in aeration to the lungs compared with previous exam.
2. Stable support apparatus.

## 2020-06-15 IMAGING — DX DG CHEST 1V PORT
1 series · 1 of 1 positions shown · non-contrast
Comparison: Portable exam 8288 hours compared to 01/03/2019

CLINICAL DATA: Respiratory failure, [id number]

EXAM:
PORTABLE CHEST 1 VIEW

[chest ap]
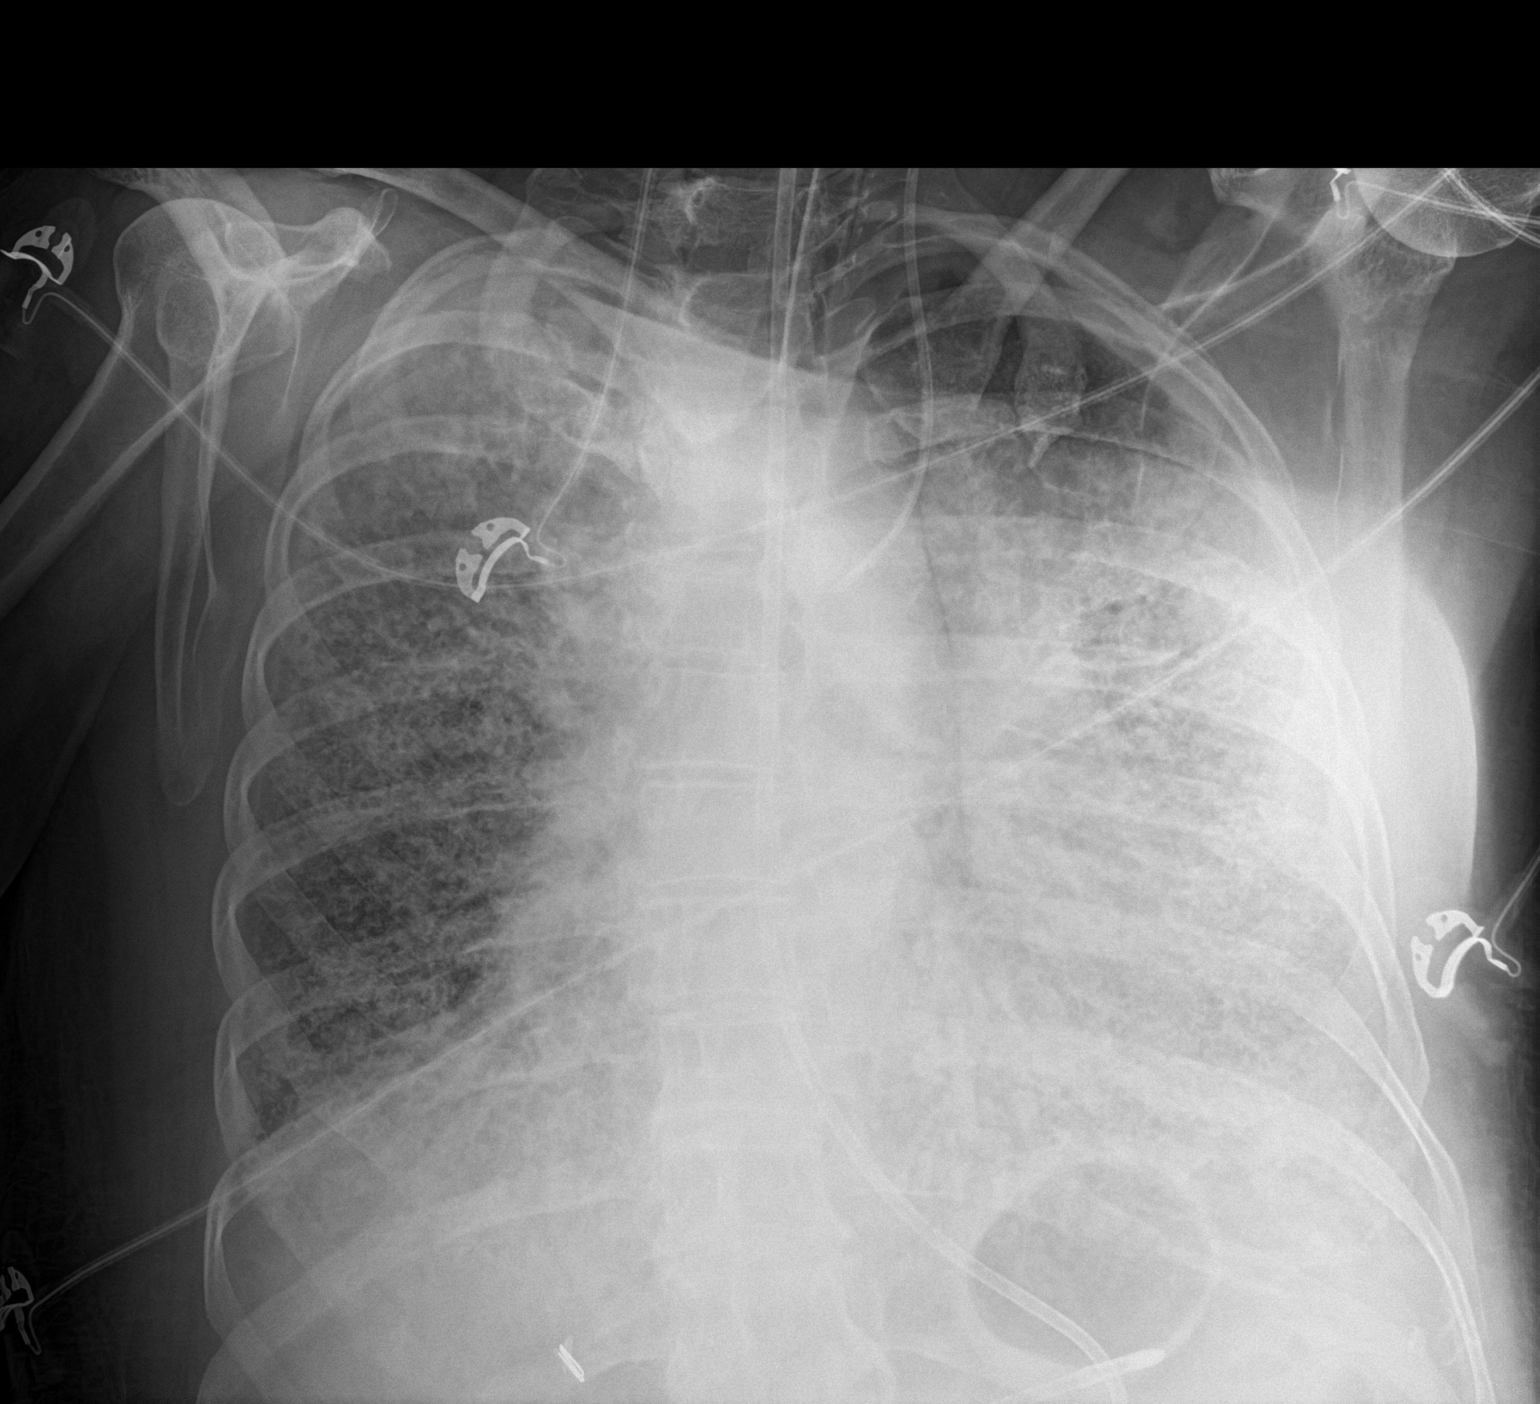

[1 of 1 positions shown; findings below may reference images not displayed]

FINDINGS: Tip of endotracheal tube projects 6.0 cm above carina.

Feeding tube extends into stomach.

LEFT jugular central venous catheter with tip projecting over
brachiocephalic vein.

Severe diffuse BILATERAL airspace infiltrates unchanged.

Heart size obscured.

No pleural effusion or pneumothorax.
IMPRESSION: Persistent severe diffuse BILATERAL airspace infiltrates.
